# Patient Record
Sex: Female | Born: 1950 | Race: Black or African American | Hispanic: No | Smoking: Current every day smoker
Health system: Southern US, Community
[De-identification: ages and names within clinical notes are randomized; demographics above are authoritative.]

## PROBLEM LIST (undated history)

## (undated) DIAGNOSIS — IMO0002 Reserved for concepts with insufficient information to code with codable children: Secondary | ICD-10-CM

## (undated) DIAGNOSIS — Z803 Family history of malignant neoplasm of breast: Secondary | ICD-10-CM

## (undated) DIAGNOSIS — M1990 Unspecified osteoarthritis, unspecified site: Secondary | ICD-10-CM

## (undated) DIAGNOSIS — R87619 Unspecified abnormal cytological findings in specimens from cervix uteri: Secondary | ICD-10-CM

## (undated) DIAGNOSIS — K648 Other hemorrhoids: Secondary | ICD-10-CM

## (undated) DIAGNOSIS — M2040 Other hammer toe(s) (acquired), unspecified foot: Secondary | ICD-10-CM

## (undated) DIAGNOSIS — I499 Cardiac arrhythmia, unspecified: Secondary | ICD-10-CM

## (undated) DIAGNOSIS — T7840XA Allergy, unspecified, initial encounter: Secondary | ICD-10-CM

## (undated) DIAGNOSIS — I10 Essential (primary) hypertension: Secondary | ICD-10-CM

## (undated) DIAGNOSIS — Z85038 Personal history of other malignant neoplasm of large intestine: Secondary | ICD-10-CM

## (undated) DIAGNOSIS — I4891 Unspecified atrial fibrillation: Secondary | ICD-10-CM

## (undated) HISTORY — DX: Allergy, unspecified, initial encounter: T78.40XA

## (undated) HISTORY — DX: Personal history of other malignant neoplasm of large intestine: Z85.038

## (undated) HISTORY — PX: LAPAROSCOPIC SUPRACERVICAL HYSTERECTOMY: SUR797

## (undated) HISTORY — DX: Unspecified osteoarthritis, unspecified site: M19.90

## (undated) HISTORY — DX: Other hemorrhoids: K64.8

## (undated) HISTORY — DX: Other hammer toe(s) (acquired), unspecified foot: M20.40

## (undated) HISTORY — PX: ABDOMINAL HYSTERECTOMY: SHX81

## (undated) HISTORY — DX: Essential (primary) hypertension: I10

## (undated) HISTORY — DX: Family history of malignant neoplasm of breast: Z80.3

## (undated) HISTORY — DX: Unspecified abnormal cytological findings in specimens from cervix uteri: R87.619

## (undated) HISTORY — DX: Reserved for concepts with insufficient information to code with codable children: IMO0002

## (undated) HISTORY — PX: COLONOSCOPY: SHX174

## (undated) HISTORY — DX: Unspecified atrial fibrillation: I48.91

## (undated) HISTORY — PX: OTHER SURGICAL HISTORY: SHX169

---

## 1986-10-12 HISTORY — PX: OTHER SURGICAL HISTORY: SHX169

## 1998-08-27 ENCOUNTER — Emergency Department (HOSPITAL_COMMUNITY): Admission: EM | Admit: 1998-08-27 | Discharge: 1998-08-27 | Payer: Self-pay | Admitting: Internal Medicine

## 1998-08-27 ENCOUNTER — Encounter: Payer: Self-pay | Admitting: Emergency Medicine

## 1999-01-02 ENCOUNTER — Emergency Department (HOSPITAL_COMMUNITY): Admission: EM | Admit: 1999-01-02 | Discharge: 1999-01-02 | Payer: Self-pay | Admitting: Emergency Medicine

## 1999-01-20 ENCOUNTER — Ambulatory Visit (HOSPITAL_BASED_OUTPATIENT_CLINIC_OR_DEPARTMENT_OTHER): Admission: RE | Admit: 1999-01-20 | Discharge: 1999-01-20 | Payer: Self-pay

## 1999-09-22 ENCOUNTER — Emergency Department (HOSPITAL_COMMUNITY): Admission: EM | Admit: 1999-09-22 | Discharge: 1999-09-22 | Payer: Self-pay | Admitting: Emergency Medicine

## 2000-10-12 ENCOUNTER — Emergency Department (HOSPITAL_COMMUNITY): Admission: EM | Admit: 2000-10-12 | Discharge: 2000-10-12 | Payer: Self-pay | Admitting: Internal Medicine

## 2000-11-03 ENCOUNTER — Emergency Department (HOSPITAL_COMMUNITY): Admission: EM | Admit: 2000-11-03 | Discharge: 2000-11-03 | Payer: Self-pay | Admitting: Emergency Medicine

## 2002-02-25 ENCOUNTER — Emergency Department (HOSPITAL_COMMUNITY): Admission: EM | Admit: 2002-02-25 | Discharge: 2002-02-25 | Payer: Self-pay | Admitting: Emergency Medicine

## 2002-02-25 ENCOUNTER — Encounter: Payer: Self-pay | Admitting: Emergency Medicine

## 2003-05-29 ENCOUNTER — Emergency Department (HOSPITAL_COMMUNITY): Admission: EM | Admit: 2003-05-29 | Discharge: 2003-05-29 | Payer: Self-pay | Admitting: Emergency Medicine

## 2003-09-02 ENCOUNTER — Emergency Department (HOSPITAL_COMMUNITY): Admission: EM | Admit: 2003-09-02 | Discharge: 2003-09-03 | Payer: Self-pay | Admitting: Emergency Medicine

## 2006-05-26 ENCOUNTER — Emergency Department (HOSPITAL_COMMUNITY): Admission: EM | Admit: 2006-05-26 | Discharge: 2006-05-26 | Payer: Self-pay | Admitting: Family Medicine

## 2006-11-11 ENCOUNTER — Emergency Department (HOSPITAL_COMMUNITY): Admission: EM | Admit: 2006-11-11 | Discharge: 2006-11-11 | Payer: Self-pay | Admitting: Emergency Medicine

## 2007-07-12 ENCOUNTER — Ambulatory Visit: Payer: Self-pay | Admitting: Internal Medicine

## 2007-07-12 LAB — CONVERTED CEMR LAB
Alkaline Phosphatase: 77 units/L (ref 39–117)
Basophils Absolute: 0 10*3/uL (ref 0.0–0.1)
Creatinine, Ser: 0.75 mg/dL (ref 0.40–1.20)
Eosinophils Absolute: 0.2 10*3/uL (ref 0.0–0.7)
Eosinophils Relative: 2 % (ref 0–5)
Glucose, Bld: 65 mg/dL — ABNORMAL LOW (ref 70–99)
HCT: 41.5 % (ref 36.0–46.0)
HDL: 58 mg/dL (ref >39)
LDL Cholesterol: 105 mg/dL — ABNORMAL HIGH (ref 0–99)
Lymphs Abs: 3.4 10*3/uL — ABNORMAL HIGH (ref 0.7–3.3)
MCV: 88.3 fL (ref 78.0–100.0)
Monocytes Absolute: 0.4 10*3/uL (ref 0.2–0.7)
Platelets: 230 10*3/uL (ref 150–400)
RDW: 15.3 % — ABNORMAL HIGH (ref 11.5–14.0)
Sodium: 140 meq/L (ref 135–145)
Total Bilirubin: 0.4 mg/dL (ref 0.3–1.2)
Total CHOL/HDL Ratio: 3.1
Total Protein: 6.9 g/dL (ref 6.0–8.3)
Triglycerides: 82 mg/dL (ref <150)
VLDL: 16 mg/dL (ref 0–40)

## 2007-07-14 ENCOUNTER — Ambulatory Visit: Payer: Self-pay | Admitting: *Deleted

## 2007-07-26 ENCOUNTER — Encounter: Payer: Self-pay | Admitting: Family Medicine

## 2007-07-26 ENCOUNTER — Ambulatory Visit: Payer: Self-pay | Admitting: Internal Medicine

## 2007-08-29 ENCOUNTER — Ambulatory Visit: Payer: Self-pay | Admitting: Internal Medicine

## 2007-11-21 ENCOUNTER — Ambulatory Visit: Payer: Self-pay | Admitting: Family Medicine

## 2007-11-23 ENCOUNTER — Ambulatory Visit: Payer: Self-pay | Admitting: Internal Medicine

## 2007-11-29 ENCOUNTER — Ambulatory Visit: Payer: Self-pay | Admitting: Internal Medicine

## 2007-12-13 ENCOUNTER — Ambulatory Visit (HOSPITAL_COMMUNITY): Admission: RE | Admit: 2007-12-13 | Discharge: 2007-12-13 | Payer: Self-pay | Admitting: Family Medicine

## 2008-01-04 ENCOUNTER — Ambulatory Visit: Payer: Self-pay | Admitting: Internal Medicine

## 2008-02-13 ENCOUNTER — Ambulatory Visit: Payer: Self-pay | Admitting: Family Medicine

## 2008-03-19 ENCOUNTER — Ambulatory Visit: Payer: Self-pay | Admitting: Internal Medicine

## 2008-03-20 ENCOUNTER — Encounter (INDEPENDENT_AMBULATORY_CARE_PROVIDER_SITE_OTHER): Payer: Self-pay | Admitting: Family Medicine

## 2009-01-01 ENCOUNTER — Ambulatory Visit: Payer: Self-pay | Admitting: Internal Medicine

## 2009-01-03 ENCOUNTER — Ambulatory Visit: Payer: Self-pay | Admitting: Internal Medicine

## 2009-01-15 ENCOUNTER — Ambulatory Visit (HOSPITAL_COMMUNITY): Admission: RE | Admit: 2009-01-15 | Discharge: 2009-01-15 | Payer: Self-pay | Admitting: Internal Medicine

## 2009-03-25 ENCOUNTER — Ambulatory Visit: Payer: Self-pay | Admitting: Internal Medicine

## 2009-06-21 ENCOUNTER — Telehealth (INDEPENDENT_AMBULATORY_CARE_PROVIDER_SITE_OTHER): Payer: Self-pay | Admitting: *Deleted

## 2009-06-25 ENCOUNTER — Ambulatory Visit: Payer: Self-pay | Admitting: Family Medicine

## 2009-07-04 ENCOUNTER — Ambulatory Visit: Payer: Self-pay | Admitting: Family Medicine

## 2009-07-09 ENCOUNTER — Ambulatory Visit: Payer: Self-pay | Admitting: Internal Medicine

## 2009-08-01 ENCOUNTER — Ambulatory Visit: Payer: Self-pay | Admitting: Internal Medicine

## 2009-08-30 ENCOUNTER — Ambulatory Visit: Payer: Self-pay | Admitting: Family Medicine

## 2010-02-20 ENCOUNTER — Ambulatory Visit: Payer: Self-pay | Admitting: Family Medicine

## 2010-02-20 LAB — CONVERTED CEMR LAB
ALT: 18 units/L (ref 0–35)
AST: 15 units/L (ref 0–37)
BUN: 13 mg/dL (ref 6–23)
Basophils Absolute: 0 10*3/uL (ref 0.0–0.1)
Basophils Relative: 1 % (ref 0–1)
Calcium: 9.6 mg/dL (ref 8.4–10.5)
Creatinine, Ser: 0.74 mg/dL (ref 0.40–1.20)
Eosinophils Absolute: 0.1 10*3/uL (ref 0.0–0.7)
Eosinophils Relative: 2 % (ref 0–5)
HCT: 39.6 % (ref 36.0–46.0)
HCV Ab: NEGATIVE
Hep A IgM: NEGATIVE
MCHC: 31.3 g/dL (ref 30.0–36.0)
MCV: 90 fL (ref 78.0–100.0)
Neutrophils Relative %: 62 % (ref 43–77)
Platelets: 227 10*3/uL (ref 150–400)
RDW: 15.1 % (ref 11.5–15.5)
TSH: 1.261 microintl units/mL (ref 0.350–4.500)
Total Bilirubin: 0.6 mg/dL (ref 0.3–1.2)
WBC: 6.4 10*3/uL (ref 4.0–10.5)

## 2010-02-25 ENCOUNTER — Ambulatory Visit (HOSPITAL_COMMUNITY): Admission: RE | Admit: 2010-02-25 | Discharge: 2010-02-25 | Payer: Self-pay | Admitting: Internal Medicine

## 2010-04-01 ENCOUNTER — Ambulatory Visit: Payer: Self-pay | Admitting: Family Medicine

## 2010-05-09 ENCOUNTER — Ambulatory Visit: Payer: Self-pay | Admitting: Obstetrics and Gynecology

## 2010-06-20 ENCOUNTER — Emergency Department (HOSPITAL_COMMUNITY): Admission: EM | Admit: 2010-06-20 | Discharge: 2010-06-20 | Payer: Self-pay | Admitting: Emergency Medicine

## 2010-11-07 ENCOUNTER — Ambulatory Visit
Admission: RE | Admit: 2010-11-07 | Discharge: 2010-11-07 | Payer: Self-pay | Source: Home / Self Care | Attending: Obstetrics and Gynecology | Admitting: Obstetrics and Gynecology

## 2010-11-07 ENCOUNTER — Other Ambulatory Visit: Payer: Self-pay | Admitting: Obstetrics and Gynecology

## 2010-11-08 NOTE — Progress Notes (Signed)
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  The patient has a normal colposcopic examination in July 2011, and presents today for followup Pap smear.  The patient is also complaining of dyspareunia and vasomotor symptoms and was previously prescribed Premarin that has ran out and desires to be restarted on the Premarin.  Pap smear was performed today with visualization of an atrophic vaginal mucosa.  The patient will be contacted with any abnormal results.  Otherwise, plan is for repeat Pap smear in 6 months. The patient was provided with prescription of 0.3 mg of Premarin.          ______________________________ Catalina Antigua, MD    PC/MEDQ  D:  11/07/2010  T:  11/08/2010  Job:  [id number]

## 2010-12-15 ENCOUNTER — Encounter: Payer: Self-pay | Admitting: Obstetrics & Gynecology

## 2010-12-17 ENCOUNTER — Encounter: Payer: Self-pay | Admitting: Obstetrics and Gynecology

## 2011-01-09 ENCOUNTER — Encounter: Payer: Self-pay | Admitting: Obstetrics & Gynecology

## 2011-01-30 ENCOUNTER — Other Ambulatory Visit (HOSPITAL_COMMUNITY): Payer: Self-pay | Admitting: Family Medicine

## 2011-01-30 DIAGNOSIS — Z1231 Encounter for screening mammogram for malignant neoplasm of breast: Secondary | ICD-10-CM

## 2011-02-23 ENCOUNTER — Encounter: Payer: Self-pay | Admitting: Family Medicine

## 2011-02-27 ENCOUNTER — Ambulatory Visit (HOSPITAL_COMMUNITY): Payer: Self-pay

## 2011-03-11 ENCOUNTER — Other Ambulatory Visit (HOSPITAL_COMMUNITY): Payer: Self-pay | Admitting: Family Medicine

## 2011-03-11 DIAGNOSIS — Z1231 Encounter for screening mammogram for malignant neoplasm of breast: Secondary | ICD-10-CM

## 2011-03-16 ENCOUNTER — Encounter: Payer: Self-pay | Admitting: Family Medicine

## 2011-03-20 ENCOUNTER — Ambulatory Visit (HOSPITAL_COMMUNITY): Payer: Self-pay | Attending: Family Medicine

## 2011-05-26 ENCOUNTER — Ambulatory Visit (HOSPITAL_COMMUNITY)
Admission: RE | Admit: 2011-05-26 | Discharge: 2011-05-26 | Disposition: A | Discharge status: Home / Self Care | Payer: Self-pay | Source: Ambulatory Visit | Attending: Family Medicine | Admitting: Family Medicine

## 2011-05-26 DIAGNOSIS — Z1231 Encounter for screening mammogram for malignant neoplasm of breast: Secondary | ICD-10-CM | POA: Insufficient documentation

## 2011-06-08 ENCOUNTER — Ambulatory Visit (INDEPENDENT_AMBULATORY_CARE_PROVIDER_SITE_OTHER): Payer: Self-pay | Admitting: Family Medicine

## 2011-06-08 ENCOUNTER — Encounter: Payer: Self-pay | Admitting: *Deleted

## 2011-06-08 VITALS — BP 145/84 | HR 72 | Temp 98.6°F | Ht 61.0 in | Wt 146.5 lb

## 2011-06-08 DIAGNOSIS — IMO0002 Reserved for concepts with insufficient information to code with codable children: Secondary | ICD-10-CM

## 2011-06-08 DIAGNOSIS — R87612 Low grade squamous intraepithelial lesion on cytologic smear of cervix (LGSIL): Secondary | ICD-10-CM

## 2011-06-08 DIAGNOSIS — R87622 Low grade squamous intraepithelial lesion on cytologic smear of vagina (LGSIL): Secondary | ICD-10-CM

## 2011-06-08 NOTE — Patient Instructions (Signed)
We will notify you of your results and any additional test or treatments needed.

## 2011-06-08 NOTE — Progress Notes (Signed)
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 She had a TAH in 1987 for menorrhagia/fibroids, and LSO in 1988 or 1987. She had a negative colpo in July 2011, and pap in Jan 2012 showed LSIL again. Patient has rescheduled her colpo several times, and today has presented for this test. She denies any problems with vaginal discharge, vaginal dryness, or other. She states she does not take the premarin prescribed to her regularly and it has been some months since she had taken it. She is on vitamins and no prescription medications at this time.  Filed Vitals:   06/08/11 1435  BP: 145/84  Pulse: 72  Temp: 98.6 F (37 C)    No pregnancy test s/p TAH  Colposcopy of vagina performed with repeat pap testing. Colposcopic examination revealed vaginal cuff with scarring noted. No acetowhite lesions and no lesions noted with lugol's staining. No biopsies taken.  A/P 1. 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 Since it had been > 6 months, pap was repeated today as well. I will contact the patient with results and additional plans as warranted.

## 2011-06-25 ENCOUNTER — Telehealth: Payer: Self-pay | Admitting: *Deleted

## 2011-06-25 NOTE — Telephone Encounter (Signed)
Kelsey Bridges called back again, wanting to know results from 06/08/11 colposcopy appointment. Explained to pt per Doctor notes- doctor did do colposcopy but did not find any abnormal areas to biopsy, so she did another pap- pt. States did not receive any results. Informed pt papsmear was negative. Pt wants to know if she should come back before her next yearly checkup- instructed pt usually after abnormal paps should come back in 6 months. pts states she will schedule a 6 month appt.  Will forward this to Dr. Natale Milch to make sure she wants patient to have 6 month pap versus 1 year pap/annual exam.

## 2011-06-25 NOTE — Telephone Encounter (Addendum)
Pt called today at 7:49 and left a message she was expecting results to be called to her re: her 06/08/11 appointment and she has not received any calls. Called pt. And left a message we are returning her call and will call again , or she can call clinic back.

## 2011-06-26 NOTE — Telephone Encounter (Signed)
Yes, the pap was normal and she will need a repeat pap in 6 months time. Thanks!

## 2012-02-26 ENCOUNTER — Other Ambulatory Visit: Payer: Self-pay | Admitting: Family Medicine

## 2012-02-26 DIAGNOSIS — Z1231 Encounter for screening mammogram for malignant neoplasm of breast: Secondary | ICD-10-CM

## 2012-03-03 ENCOUNTER — Ambulatory Visit: Payer: Self-pay | Admitting: Physician Assistant

## 2012-03-14 ENCOUNTER — Ambulatory Visit: Payer: Self-pay | Admitting: Obstetrics and Gynecology

## 2012-04-04 ENCOUNTER — Ambulatory Visit: Payer: Self-pay | Admitting: Advanced Practice Midwife

## 2012-05-30 ENCOUNTER — Ambulatory Visit: Payer: Self-pay | Admitting: Family Medicine

## 2012-07-04 ENCOUNTER — Ambulatory Visit: Payer: Self-pay | Admitting: Obstetrics & Gynecology

## 2012-09-27 ENCOUNTER — Emergency Department (HOSPITAL_COMMUNITY): Payer: Self-pay

## 2012-09-27 ENCOUNTER — Encounter (HOSPITAL_COMMUNITY): Payer: Self-pay

## 2012-09-27 ENCOUNTER — Emergency Department (INDEPENDENT_AMBULATORY_CARE_PROVIDER_SITE_OTHER)
Admission: EM | Admit: 2012-09-27 | Discharge: 2012-09-27 | Disposition: A | Discharge status: Home / Self Care | Payer: Self-pay | Source: Home / Self Care

## 2012-09-27 DIAGNOSIS — J069 Acute upper respiratory infection, unspecified: Secondary | ICD-10-CM

## 2012-09-27 MED ORDER — DEXTROMETHORPHAN-GUAIFENESIN 10-100 MG/5ML PO LIQD: 5.0000 mL | ORAL | Status: DC | PRN

## 2012-09-27 MED ORDER — DOXYCYCLINE HYCLATE 50 MG PO CAPS: 100.0000 mg | ORAL_CAPSULE | Freq: Two times a day (BID) | ORAL | Status: DC

## 2012-09-27 NOTE — ED Notes (Signed)
C/o cold like symptoms for 1 week.  Cough at night keeps her awake

## 2012-09-27 NOTE — ED Provider Notes (Signed)
Patient Demographics  Kelsey Bridges, is a 61 y.o. female  WUJ:[id number]  NFA:[id number]  DOB - 09/26/1951  Chief Complaint  Patient presents with  . URI        Subjective:   Kelsey Bridges today is hereto establish primary care. She complains of having subjective fever with productive cough for the past one week. She denies any shortness of breath. She claims that his symptoms initially started with diarrhea which resolved, then she started having cough and cold-like symptoms. She has tried numerous over-the-counter remedies with no success. Patient has No headache, No chest pain, No abdominal pain - No Nausea, No new weakness tingling or numbness, No SOB.   Objective:    Filed Vitals:   09/27/12 1603  BP: 139/88  Pulse: 74  Temp: 98.2 F (36.8 C)  TempSrc: Oral  Resp: 19  SpO2: 100%     ALLERGIES:   Allergies  Allergen Reactions  . Penicillins     PAST MEDICAL HISTORY: Past Medical History  Diagnosis Date  . Abnormal Pap smear   . Hypertension     PAST SURGICAL HISTORY: Past Surgical History  Procedure Date  . Laparoscopic supracervical hysterectomy   . Left ovary 1988  . Varicose veins     FAMILY HISTORY: Family History  Problem Relation Age of Onset  . Cancer Brother     liver  . Cancer Sister     breast    MEDICATIONS AT HOME: Prior to Admission medications   Medication Sig Start Date End Date Taking? Authorizing Provider  dextromethorphan-guaiFENesin (ROBITUSSIN-DM) 10-100 MG/5ML liquid Take 5 mLs by mouth every 4 (four) hours as needed for cough. 09/27/12   Hallie Ertl Levora Dredge, MD  doxycycline (VIBRAMYCIN) 50 MG capsule Take 2 capsules (100 mg total) by mouth 2 (two) times daily. 09/27/12   Elia Keenum Levora Dredge, MD  sertraline (ZOLOFT) 50 MG tablet Take 50 mg by mouth daily.      Historical Provider, MD    REVIEW OF SYSTEMS:  Constitutional:   No   Fevers, chills, fatigue.  HEENT:    No headaches, Sore throat,    Cardio-vascular: No chest pain,  Orthopnea, swelling in lower extremities, anasarca, palpitations  GI:  No abdominal pain, nausea, vomiting, diarrhea  Resp: No shortness of breath,  No coughing up of blood.No wheezing.  Skin:  no rash or lesions.  GU:  no dysuria, change in color of urine, no urgency or frequency.  No flank pain.  Musculoskeletal: No joint pain or swelling.  No decreased range of motion.  No back pain.  Psych: No change in mood or affect. No depression or anxiety.  No memory loss.   Exam  General appearance :Awake, alert, not in any distress. Speech Clear. Not toxic Looking HEENT: Atraumatic and Normocephalic, pupils equally reactive to light and accomodation Neck: supple, no JVD. No cervical lymphadenopathy.  Chest:Good air entry bilaterally, no added sounds  CVS: S1 S2 regular, no murmurs.  Abdomen: Bowel sounds present, Non tender and not distended with no gaurding, rigidity or rebound. Extremities: B/L Lower Ext shows no edema, both legs are warm to touch Neurology: Awake alert, and oriented X 3, CN II-XII intact, Non focal Skin:No Rash Wounds:N/A    Data Review   CBC No results found for this basename: WBC:5,HGB:5,HCT:5,PLT:5,MCV:5,MCH:5,MCHC:5,RDW:5,NEUTRABS:5,LYMPHSABS:5,MONOABS:5,EOSABS:5,BASOSABS:5,BANDABS:5,BANDSABD:5 in the last 168 hours  Chemistries   No results found for this basename: NA:5,K:5,CL:5,CO2:5,GLUCOSE:5,BUN:5,CREATININE:5,GFRCGP,:5,CALCIUM:5,MG:5,AST:5,ALT:5,ALKPHOS:5,BILITOT:5 in the last 168 hours ------------------------------------------------------------------------------------------------------------------ No results found for this basename: HGBA1C:2 in the last 72 hours ------------------------------------------------------------------------------------------------------------------  No results found for this basename: CHOL:2,HDL:2,LDLCALC:2,TRIG:2,CHOLHDL:2,LDLDIRECT:2 in the last 72  hours ------------------------------------------------------------------------------------------------------------------ No results found for this basename: TSH,T4TOTAL,FREET3,T3FREE,THYROIDAB in the last 72 hours ------------------------------------------------------------------------------------------------------------------ No results found for this basename: VITAMINB12:2,FOLATE:2,FERRITIN:2,TIBC:2,IRON:2,RETICCTPCT:2 in the last 72 hours  Coagulation profile  No results found for this basename: INR:5,PROTIME:5 in the last 168 hours    Assessment & Plan   Likely URI - Offered to get a chest x-ray-however the patient refused, she claims to have subjective fever. We empirically try to treat with doxycycline and patient to continue to use over-the-counter antitussives. We'll see her back in 2 weeks, if her symptoms are still persistent will see if she will agree to a chest x-ray that time.  Health maintenance - Will need reassessment of her health maintenance issues once she is over this acute illness    Follow-up Information    Schedule an appointment as soon as possible for a visit in 2 weeks to follow up.          Maretta Bees, MD 09/27/12 [phone]

## 2013-01-06 ENCOUNTER — Ambulatory Visit (INDEPENDENT_AMBULATORY_CARE_PROVIDER_SITE_OTHER): Payer: Self-pay | Admitting: Medical

## 2013-01-06 ENCOUNTER — Encounter: Payer: Self-pay | Admitting: Medical

## 2013-01-06 VITALS — BP 140/92 | HR 66 | Temp 98.3°F | Ht 59.0 in | Wt 138.9 lb

## 2013-01-06 DIAGNOSIS — Z87898 Personal history of other specified conditions: Secondary | ICD-10-CM

## 2013-01-06 DIAGNOSIS — Z8742 Personal history of other diseases of the female genital tract: Secondary | ICD-10-CM

## 2013-01-06 NOTE — Patient Instructions (Signed)

## 2013-01-06 NOTE — Progress Notes (Signed)
Subjective:    Kelsey Bridges is a 62 y.o. female who presents for an annual exam. The patient has no complaints today. The patient is not sexually active. GYN screening history: last pap: was normal. The patient wears seatbelts: yes. The patient participates in regular exercise: yes. Has the patient ever been transfused or tattooed?: no. The patient reports that there is not domestic violence in her life. Patient had hysterectomy in 1987 for DUB. Her last pap was in 05/2011 and was normal. She had one abnormal pap prior to that which showed LSIL of the vagina. She had a normal colposcopy that did not require any biopsies. She is due for her mammogram now.   Menstrual History: OB History   Grav Para Term Preterm Abortions TAB SAB Ect Mult Living   2 1 1  1  1   1        No LMP recorded. Patient has had a hysterectomy.    The following portions of the patient's history were reviewed and updated as appropriate: allergies, current medications, past family history, past medical history, past social history, past surgical history and problem list.  Review of Systems Pertinent items are noted in HPI.    Objective:     BP 140/92  Pulse 66  Temp(Src) 98.3 F (36.8 C)  Ht 4\' 11"  (1.499 m)  Wt 138 lb 14.4 oz (63.005 kg)  BMI 28.04 kg/m2 GENERAL: Well-developed, well-nourished female in no acute distress.  HEENT: Normocephalic, atraumatic. LUNGS: Clear to auscultation bilaterally.  HEART: Regular rate and rhythm. BREASTS: Symmetric in size. No masses, skin changes, nipple drainage, or lymphadenopathy. ABDOMEN: Soft, nontender, nondistended. No organomegaly. PELVIC: Normal external female genitalia. Vagina is pink and rugated.  Normal discharge. Well-healed vaginal cuff. Pap smear obtained. No suprapubic tenderness on bimanual exam. EXTREMITIES: No cyanosis, clubbing, or edema, moderate area of venous stasis on the left lower extremity.   .    Assessment:    Healthy female exam.     Plan:    1. Patient will be contacted with any abnormal pap results 2. With normal results patient should not require additional pap smears 3. Patient given contact information for [facility] to establish primary care 4. Patient to call to schedule mammogram today  2. Patient

## 2013-01-06 NOTE — Progress Notes (Signed)
History of colpo, last pap 05/2011- here for annual and pap if needed. Thinks she needs iron because she is cold a lot of the time, especially if she drinks cold drinks. Patient states used to go to [facility] and needs to establish care again. Called Clinic seeing [facility] patients and no answer and unable to leave message . Gave patient phone numbers to call Family medicine at [facility]. Or   [facility].

## 2013-01-23 ENCOUNTER — Telehealth: Payer: Self-pay

## 2013-01-23 NOTE — Telephone Encounter (Signed)
Called pt and informed her that she had an abnormal pap smear and the provider would like for her to come in for a coloscopy.  I informed pt that her appt is scheduled for May 5 @ 1pm.  Pt stated "ok, thank you cause I'm on my way to work".  I advised pt if that appt does not work to please give [place] a call back.  Pt stated "ok" and had no further questions.

## 2013-01-23 NOTE — Telephone Encounter (Signed)
Message copied by Faythe Casa on Mon Jan 23, 2013  9:06 AM ------      Message from: Malena Catholic      Created: Fri Jan 20, 2013  8:19 AM       Appt scheduled  Feb 13, 2013 at 1:00 pm with Dr Macon Large            ----- Message -----         From: Drucilla Schmidt Day, RN         Sent: 01/11/2013   4:53 PM           To: [facility] Admin Pool            Please schedule colpo appt and return to clinical pool for pt to be notified.  Thanks       ----- Message -----         From: Freddi Starr, PA-C         Sent: 01/11/2013   4:45 PM           To: [facility] Clinical Pool            Patient had ASCUS on vaginal pap. Will need vaginal colposcopy scheduled. Please get an appointment and inform the patient. If the patient asks, this will be the last pap/colpo that she should need. If colpo is normal she will no longer get pap smears unless a gross abnormality is seen during annual exam. Thanks!             ------

## 2013-02-13 ENCOUNTER — Encounter: Payer: Self-pay | Admitting: Obstetrics & Gynecology

## 2013-03-10 ENCOUNTER — Encounter: Payer: Self-pay | Admitting: Obstetrics & Gynecology

## 2013-04-12 ENCOUNTER — Ambulatory Visit: Payer: Self-pay | Admitting: Obstetrics & Gynecology

## 2013-07-04 ENCOUNTER — Telehealth: Payer: Self-pay | Admitting: *Deleted

## 2013-07-04 NOTE — Telephone Encounter (Addendum)
Pt left message regarding her procedure appt. She wants to know if it is painful. I returned pt's call and left message stating that there is some minor discomfort during the procedure, however the procedure is very short. She will be able to drive and return to normal activities following the appt. She will be able to take Tylenol or ibuprofen if needed after the procedure although many pts do not require any medication. We will discuss the procedure in more detail on the day of her appt. If she had additional questions she may call back.

## 2013-08-10 ENCOUNTER — Encounter: Payer: Self-pay | Admitting: Obstetrics & Gynecology

## 2013-10-02 ENCOUNTER — Encounter: Payer: Self-pay | Admitting: Obstetrics and Gynecology

## 2014-02-07 ENCOUNTER — Emergency Department (HOSPITAL_COMMUNITY)
Admission: EM | Admit: 2014-02-07 | Discharge: 2014-02-07 | Disposition: A | Discharge status: Home / Self Care | Payer: Self-pay | Attending: Emergency Medicine | Admitting: Emergency Medicine

## 2014-02-07 ENCOUNTER — Encounter (HOSPITAL_COMMUNITY): Payer: Self-pay | Admitting: Emergency Medicine

## 2014-02-07 DIAGNOSIS — I10 Essential (primary) hypertension: Secondary | ICD-10-CM | POA: Insufficient documentation

## 2014-02-07 DIAGNOSIS — R42 Dizziness and giddiness: Secondary | ICD-10-CM | POA: Insufficient documentation

## 2014-02-07 DIAGNOSIS — Z8739 Personal history of other diseases of the musculoskeletal system and connective tissue: Secondary | ICD-10-CM | POA: Insufficient documentation

## 2014-02-07 DIAGNOSIS — F17200 Nicotine dependence, unspecified, uncomplicated: Secondary | ICD-10-CM | POA: Insufficient documentation

## 2014-02-07 DIAGNOSIS — Z880 Allergy status to penicillin: Secondary | ICD-10-CM | POA: Insufficient documentation

## 2014-02-07 DIAGNOSIS — Z79899 Other long term (current) drug therapy: Secondary | ICD-10-CM | POA: Insufficient documentation

## 2014-02-07 DIAGNOSIS — Z791 Long term (current) use of non-steroidal anti-inflammatories (NSAID): Secondary | ICD-10-CM | POA: Insufficient documentation

## 2014-02-07 LAB — I-STAT TROPONIN, ED: Troponin i, poc: 0 ng/mL (ref 0.00–0.08)

## 2014-02-07 LAB — CBC WITH DIFFERENTIAL/PLATELET
Basophils Absolute: 0 10*3/uL (ref 0.0–0.1)
Basophils Relative: 0 % (ref 0–1)
EOS ABS: 0.1 10*3/uL (ref 0.0–0.7)
Eosinophils Relative: 1 % (ref 0–5)
HCT: 39.8 % (ref 36.0–46.0)
HEMOGLOBIN: 12.9 g/dL (ref 12.0–15.0)
LYMPHS ABS: 1.3 10*3/uL (ref 0.7–4.0)
Lymphocytes Relative: 24 % (ref 12–46)
MCH: 28.9 pg (ref 26.0–34.0)
MCHC: 32.4 g/dL (ref 30.0–36.0)
MCV: 89.2 fL (ref 78.0–100.0)
MONOS PCT: 5 % (ref 3–12)
Monocytes Absolute: 0.3 10*3/uL (ref 0.1–1.0)
NEUTROS PCT: 70 % (ref 43–77)
Neutro Abs: 3.8 10*3/uL (ref 1.7–7.7)
Platelets: 226 10*3/uL (ref 150–400)
RBC: 4.46 MIL/uL (ref 3.87–5.11)
RDW: 13.9 % (ref 11.5–15.5)
WBC: 5.4 10*3/uL (ref 4.0–10.5)

## 2014-02-07 LAB — URINALYSIS, ROUTINE W REFLEX MICROSCOPIC
Bilirubin Urine: NEGATIVE
GLUCOSE, UA: NEGATIVE mg/dL
Ketones, ur: NEGATIVE mg/dL
LEUKOCYTES UA: NEGATIVE
Nitrite: NEGATIVE
PH: 5.5 (ref 5.0–8.0)
Protein, ur: NEGATIVE mg/dL
SPECIFIC GRAVITY, URINE: 1.01 (ref 1.005–1.030)
Urobilinogen, UA: 0.2 mg/dL (ref 0.0–1.0)

## 2014-02-07 LAB — I-STAT CHEM 8, ED
BUN: 12 mg/dL (ref 6–23)
CHLORIDE: 103 meq/L (ref 96–112)
CREATININE: 1 mg/dL (ref 0.50–1.10)
Calcium, Ion: 1.3 mmol/L (ref 1.13–1.30)
GLUCOSE: 93 mg/dL (ref 70–99)
HCT: 41 % (ref 36.0–46.0)
Hemoglobin: 13.9 g/dL (ref 12.0–15.0)
POTASSIUM: 3.7 meq/L (ref 3.7–5.3)
Sodium: 143 mEq/L (ref 137–147)
TCO2: 24 mmol/L (ref 0–100)

## 2014-02-07 LAB — URINE MICROSCOPIC-ADD ON

## 2014-02-07 MED ORDER — LISINOPRIL 20 MG PO TABS: 20.0000 mg | ORAL_TABLET | Freq: Every day | ORAL | Status: DC

## 2014-02-07 MED ORDER — CLONIDINE HCL 0.1 MG PO TABS
0.1000 mg | ORAL_TABLET | Freq: Once | ORAL | Status: AC
  Administered 2014-02-07: 0.1 mg via ORAL
  Filled 2014-02-07: qty 1

## 2014-02-07 NOTE — ED Provider Notes (Signed)
CSN: [id number]     Arrival date & time 02/07/14  1021 History   First MD Initiated Contact with Patient 02/07/14 1033     Chief Complaint  Patient presents with  . Dizziness     (Consider location/radiation/quality/duration/timing/severity/associated sxs/prior Treatment) HPI Comments: Patient presents with dizziness. She has a history of hypertension and was previously on antihypertensive medication. She states she took it for about 3 months but she did not like the way it made her felt and felt that her blood pressure was better social she stopped taking it. She stopped taking it about a year ago. She has noted since that time that her blood pressure when she checked it has been in the 573-220 range systolic. Today she noted that she's had some dizziness. She had an episode last week that resolved. Today she woke up at 6 AM and felt like her balance was off and she had the sensation that the room was spinning. This is only on standing, not with head movement. She denies any headache. She denies any vision changes or speech deficits. She denies any numbness or weakness to her extremities. She denies any chest pain or shortness of breath. She denies any fevers or recent illnesses.  Patient is a 63 y.o. female presenting with dizziness.  Dizziness Associated symptoms: no blood in stool, no chest pain, no diarrhea, no headaches, no nausea, no shortness of breath and no vomiting     Past Medical History  Diagnosis Date  . Abnormal Pap smear   . Hypertension   . Hammer toe    Past Surgical History  Procedure Laterality Date  . Laparoscopic supracervical hysterectomy    . Left ovary  1988  . Varicose veins    . Abdominal hysterectomy     Family History  Problem Relation Age of Onset  . Cancer Brother     liver  . Cancer Sister     breast   History  Substance Use Topics  . Smoking status: Current Every Day Smoker -- 0.25 packs/day    Types: Cigarettes  . Smokeless tobacco: Current  User    Types: Snuff  . Alcohol Use: Yes     Comment: "at the holidays maybe"   OB History   Grav Para Term Preterm Abortions TAB SAB Ect Mult Living   2 1 1  1  1   1      Review of Systems  Constitutional: Negative for fever, chills, diaphoresis and fatigue.  HENT: Negative for congestion, rhinorrhea and sneezing.   Eyes: Negative.   Respiratory: Negative for cough, chest tightness and shortness of breath.   Cardiovascular: Negative for chest pain and leg swelling.  Gastrointestinal: Negative for nausea, vomiting, abdominal pain, diarrhea and blood in stool.  Genitourinary: Negative for frequency, hematuria, flank pain and difficulty urinating.  Musculoskeletal: Negative for arthralgias and back pain.  Skin: Negative for rash.  Neurological: Positive for dizziness. Negative for speech difficulty, weakness, numbness and headaches.      Allergies  Penicillins  Home Medications   Prior to Admission medications   Medication Sig Start Date End Date Taking? Authorizing Provider  ibuprofen (ADVIL,MOTRIN) 200 MG tablet Take 400 mg by mouth every 6 (six) hours as needed for fever, headache or mild pain.    Yes Historical Provider, MD  MELATONIN PO Take 1 tablet by mouth at bedtime as needed ([employer] of Melatonin).   Yes Historical Provider, MD   BP 132/89  Pulse 56  Temp(Src) 97.7  F (36.5 C)  Resp 9  Ht 5\' 1"  (1.549 m)  Wt 140 lb (63.504 kg)  BMI 26.47 kg/m2  SpO2 100% Physical Exam  Constitutional: She is oriented to person, place, and time. She appears well-developed and well-nourished.  HENT:  Head: Normocephalic and atraumatic.  Eyes: Pupils are equal, round, and reactive to light.  Neck: Normal range of motion. Neck supple.  Cardiovascular: Normal rate, regular rhythm and normal heart sounds.   Pulmonary/Chest: Effort normal and breath sounds normal. No respiratory distress. She has no wheezes. She has no rales. She exhibits no tenderness.  Abdominal:  Soft. Bowel sounds are normal. There is no tenderness. There is no rebound and no guarding.  Musculoskeletal: Normal range of motion. She exhibits no edema.  Lymphadenopathy:    She has no cervical adenopathy.  Neurological: She is alert and oriented to person, place, and time.  Motor 5 out of 5 all extremities. Sensation grossly intact to light touch all extremities. No pronator drift. Finger to nose intact. No cranial nerve deficits. She has normal sensation to her face. No facial drooping. Normal shoulder shrug. Extraocular eye movements are intact.  Skin: Skin is warm and dry. No rash noted.  Psychiatric: She has a normal mood and affect.    ED Course  Procedures (including critical care time) Labs Review Results for orders placed during the hospital encounter of 02/07/14  CBC WITH DIFFERENTIAL      Result Value Ref Range   WBC 5.4  4.0 - 10.5 K/uL   RBC 4.46  3.87 - 5.11 MIL/uL   Hemoglobin 12.9  12.0 - 15.0 g/dL   HCT 39.8  36.0 - 46.0 %   MCV 89.2  78.0 - 100.0 fL   MCH 28.9  26.0 - 34.0 pg   MCHC 32.4  30.0 - 36.0 g/dL   RDW 13.9  11.5 - 15.5 %   Platelets 226  150 - 400 K/uL   Neutrophils Relative % 70  43 - 77 %   Neutro Abs 3.8  1.7 - 7.7 K/uL   Lymphocytes Relative 24  12 - 46 %   Lymphs Abs 1.3  0.7 - 4.0 K/uL   Monocytes Relative 5  3 - 12 %   Monocytes Absolute 0.3  0.1 - 1.0 K/uL   Eosinophils Relative 1  0 - 5 %   Eosinophils Absolute 0.1  0.0 - 0.7 K/uL   Basophils Relative 0  0 - 1 %   Basophils Absolute 0.0  0.0 - 0.1 K/uL  URINALYSIS, ROUTINE W REFLEX MICROSCOPIC      Result Value Ref Range   Color, Urine YELLOW  YELLOW   APPearance CLOUDY (*) CLEAR   Specific Gravity, Urine 1.010  1.005 - 1.030   pH 5.5  5.0 - 8.0   Glucose, UA NEGATIVE  NEGATIVE mg/dL   Hgb urine dipstick SMALL (*) NEGATIVE   Bilirubin Urine NEGATIVE  NEGATIVE   Ketones, ur NEGATIVE  NEGATIVE mg/dL   Protein, ur NEGATIVE  NEGATIVE mg/dL   Urobilinogen, UA 0.2  0.0 - 1.0 mg/dL    Nitrite NEGATIVE  NEGATIVE   Leukocytes, UA NEGATIVE  NEGATIVE  URINE MICROSCOPIC-ADD ON      Result Value Ref Range   Squamous Epithelial / LPF RARE  RARE   WBC, UA 0-2  <3 WBC/hpf   RBC / HPF 0-2  <3 RBC/hpf   Bacteria, UA FEW (*) RARE  I-STAT CHEM 8, ED      Result  Value Ref Range   Sodium 143  137 - 147 mEq/L   Potassium 3.7  3.7 - 5.3 mEq/L   Chloride 103  96 - 112 mEq/L   BUN 12  6 - 23 mg/dL   Creatinine, Ser 1.00  0.50 - 1.10 mg/dL   Glucose, Bld 93  70 - 99 mg/dL   Calcium, Ion 1.30  1.13 - 1.30 mmol/L   TCO2 24  0 - 100 mmol/L   Hemoglobin 13.9  12.0 - 15.0 g/dL   HCT 41.0  36.0 - 46.0 %  I-STAT TROPOININ, ED      Result Value Ref Range   Troponin i, poc 0.00  0.00 - 0.08 ng/mL   Comment 3            No results found.    Imaging Review No results found.   EKG Interpretation None      Date: 02/07/2014  Rate: 72  Rhythm: normal sinus rhythm  QRS Axis: normal  Intervals: normal  ST/T Wave abnormalities: nonspecific ST/T changes  Conduction Disutrbances:none  Narrative Interpretation:   Old EKG Reviewed: none available   MDM   Final diagnoses:  HTN (hypertension)  Dizziness    Patient presents with some dizziness and elevated blood pressure. She was given a dose of clonidine by mouth in the ED and her blood pressure improved. She was able a rate in the ED without any symptoms. GI no ataxia. She has no other neurologic deficits. I feel that she can be discharged home with followup with her PMD. I feel her symptoms are likely related to her elevated blood pressure. I don't see any other evidence of a stroke or TIA.  Will start lisinopril.  Advised pt to f/u with her PMD at [facility] within the next week.  Return here if symptoms worsen.    Malvin Johns, MD 02/07/14 1450

## 2014-02-07 NOTE — ED Notes (Signed)
Pt from home via [facility] with c/o dizziness starting this morning at 6 am.  It has been intermittent since then.  Denies HA/N/V, stroke scale was neg, chest pain, syncope, or fall.  Pt reports she had dizziness this past Sunday morning but it resolved itself that morning.  Pt in NAD, A&O.

## 2014-02-07 NOTE — Discharge Instructions (Signed)
Arterial Hypertension °Arterial hypertension (high blood pressure) is a condition of elevated pressure in your blood vessels. Hypertension over a long period of time is a risk factor for strokes, heart attacks, and heart failure. It is also the leading cause of kidney (renal) failure.  °CAUSES  °· In Adults -- Over 90% of all hypertension has no known cause. This is called essential or primary hypertension. In the other 10% of people with hypertension, the increase in blood pressure is caused by another disorder. This is called secondary hypertension. Important causes of secondary hypertension are: °· Heavy alcohol use. °· Obstructive sleep apnea. °· Hyperaldosterosim (Conn's syndrome). °· Steroid use. °· Chronic kidney failure. °· Hyperparathyroidism. °· Medications. °· Renal artery stenosis. °· Pheochromocytoma. °· Cushing's disease. °· Coarctation of the aorta. °· Scleroderma renal crisis. °· Licorice (in excessive amounts). °· Drugs (cocaine, methamphetamine). °Your caregiver can explain any items above that apply to you. °· In Children -- Secondary hypertension is more common and should always be considered. °· Pregnancy -- Few women of childbearing age have high blood pressure. However, up to 10% of them develop hypertension of pregnancy. Generally, this will not harm the woman. It may be a sign of 3 complications of pregnancy: preeclampsia, HELLP syndrome, and eclampsia. Follow up and control with medication is necessary. °SYMPTOMS  °· This condition normally does not produce any noticeable symptoms. It is usually found during a routine exam. °· Malignant hypertension is a late problem of high blood pressure. It may have the following symptoms: °· Headaches. °· Blurred vision. °· End-organ damage (this means your kidneys, heart, lungs, and other organs are being damaged). °· Stressful situations can increase the blood pressure. If a person with normal blood pressure has their blood pressure go up while being  seen by their caregiver, this is often termed "white coat hypertension." Its importance is not known. It may be related with eventually developing hypertension or complications of hypertension. °· Hypertension is often confused with mental tension, stress, and anxiety. °DIAGNOSIS  °The diagnosis is made by 3 separate blood pressure measurements. They are taken at least 1 week apart from each other. If there is organ damage from hypertension, the diagnosis may be made without repeat measurements. °Hypertension is usually identified by having blood pressure readings: °· Above 140/90 mmHg measured in both arms, at 3 separate times, over a couple weeks. °· Over 130/80 mmHg should be considered a risk factor and may require treatment in patients with diabetes. °Blood pressure readings over 120/80 mmHg are called "pre-hypertension" even in non-diabetic patients. °To get a true blood pressure measurement, use the following guidelines. Be aware of the factors that can alter blood pressure readings. °· Take measurements at least 1 hour after caffeine. °· Take measurements 30 minutes after smoking and without any stress. This is another reason to quit smoking  it raises your blood pressure. °· Use a proper cuff size. Ask your caregiver if you are not sure about your cuff size. °· Most home blood pressure cuffs are automatic. They will measure systolic and diastolic pressures. The systolic pressure is the pressure reading at the start of sounds. Diastolic pressure is the pressure at which the sounds disappear. If you are elderly, measure pressures in multiple postures. Try sitting, lying or standing. °· Sit at rest for a minimum of 5 minutes before taking measurements. °· You should not be on any medications like decongestants. These are found in many cold medications. °· Record your blood pressure readings and review   them with your caregiver. °If you have hypertension: °· Your caregiver may do tests to be sure you do not have  secondary hypertension (see "causes" above). °· Your caregiver may also look for signs of metabolic syndrome. This is also called Syndrome X or Insulin Resistance Syndrome. You may have this syndrome if you have type 2 diabetes, abdominal obesity, and abnormal blood lipids in addition to hypertension. °· Your caregiver will take your medical and family history and perform a physical exam. °· Diagnostic tests may include blood tests (for glucose, cholesterol, potassium, and kidney function), a urinalysis, or an EKG. Other tests may also be necessary depending on your condition. °PREVENTION  °There are important lifestyle issues that you can adopt to reduce your chance of developing hypertension: °· Maintain a normal weight. °· Limit the amount of salt (sodium) in your diet. °· Exercise often. °· Limit alcohol intake. °· Get enough potassium in your diet. Discuss specific advice with your caregiver. °· Follow a DASH diet (dietary approaches to stop hypertension). This diet is rich in fruits, vegetables, and low-fat dairy products, and avoids certain fats. °PROGNOSIS  °Essential hypertension cannot be cured. Lifestyle changes and medical treatment can lower blood pressure and reduce complications. The prognosis of secondary hypertension depends on the underlying cause. Many people whose hypertension is controlled with medicine or lifestyle changes can live a normal, healthy life.  °RISKS AND COMPLICATIONS  °While high blood pressure alone is not an illness, it often requires treatment due to its short- and long-term effects on many organs. Hypertension increases your risk for: °· CVAs or strokes (cerebrovascular accident). °· Heart failure due to chronically high blood pressure (hypertensive cardiomyopathy). °· Heart attack (myocardial infarction). °· Damage to the retina (hypertensive retinopathy). °· Kidney failure (hypertensive nephropathy). °Your caregiver can explain list items above that apply to you. Treatment  of hypertension can significantly reduce the risk of complications. °TREATMENT  °· For overweight patients, weight loss and regular exercise are recommended. Physical fitness lowers blood pressure. °· Mild hypertension is usually treated with diet and exercise. A diet rich in fruits and vegetables, fat-free dairy products, and foods low in fat and salt (sodium) can help lower blood pressure. Decreasing salt intake decreases blood pressure in a 1/3 of people. °· Stop smoking if you are a smoker. °The steps above are highly effective in reducing blood pressure. While these actions are easy to suggest, they are difficult to achieve. Most patients with moderate or severe hypertension end up requiring medications to bring their blood pressure down to a normal level. There are several classes of medications for treatment. Blood pressure pills (antihypertensives) will lower blood pressure by their different actions. Lowering the blood pressure by 10 mmHg may decrease the risk of complications by as much as 25%. °The goal of treatment is effective blood pressure control. This will reduce your risk for complications. Your caregiver will help you determine the best treatment for you according to your lifestyle. What is excellent treatment for one person, may not be for you. °HOME CARE INSTRUCTIONS  °· Do not smoke. °· Follow the lifestyle changes outlined in the "Prevention" section. °· If you are on medications, follow the directions carefully. Blood pressure medications must be taken as prescribed. Skipping doses reduces their benefit. It also puts you at risk for problems. °· Follow up with your caregiver, as directed. °· If you are asked to monitor your blood pressure at home, follow the guidelines in the "Diagnosis" section above. °SEEK MEDICAL CARE   IF:   You think you are having medication side effects.  You have recurrent headaches or lightheadedness.  You have swelling in your ankles.  You have trouble with  your vision. SEEK IMMEDIATE MEDICAL CARE IF:   You have sudden onset of chest pain or pressure, difficulty breathing, or other symptoms of a heart attack.  You have a severe headache.  You have symptoms of a stroke (such as sudden weakness, difficulty speaking, difficulty walking). MAKE SURE YOU:   Understand these instructions.  Will watch your condition.  Will get help right away if you are not doing well or get worse. Document Released: 09/28/2005 Document Revised: 12/21/2011 Document Reviewed: 04/28/2007 [facility] Patient Information 2014 [place].  Hypertension As your heart beats, it forces blood through your arteries. This force is your blood pressure. If the pressure is too high, it is called hypertension (HTN) or high blood pressure. HTN is dangerous because you may have it and not know it. High blood pressure may mean that your heart has to work harder to pump blood. Your arteries may be narrow or stiff. The extra work puts you at risk for heart disease, stroke, and other problems.  Blood pressure consists of two numbers, a higher number over a lower, 110/72, for example. It is stated as "110 over 72." The ideal is below 120 for the top number (systolic) and under 80 for the bottom (diastolic). Write down your blood pressure today. You should pay close attention to your blood pressure if you have certain conditions such as:  Heart failure.  Prior heart attack.  Diabetes  Chronic kidney disease.  Prior stroke.  Multiple risk factors for heart disease. To see if you have HTN, your blood pressure should be measured while you are seated with your arm held at the level of the heart. It should be measured at least twice. A one-time elevated blood pressure reading (especially in the Emergency Department) does not mean that you need treatment. There may be conditions in which the blood pressure is different between your right and left arms. It is important to see your  caregiver soon for a recheck. Most people have essential hypertension which means that there is not a specific cause. This type of high blood pressure may be lowered by changing lifestyle factors such as:  Stress.  Smoking.  Lack of exercise.  Excessive weight.  Drug/tobacco/alcohol use.  Eating less salt. Most people do not have symptoms from high blood pressure until it has caused damage to the body. Effective treatment can often prevent, delay or reduce that damage. TREATMENT  When a cause has been identified, treatment for high blood pressure is directed at the cause. There are a large number of medications to treat HTN. These fall into several categories, and your caregiver will help you select the medicines that are best for you. Medications may have side effects. You should review side effects with your caregiver. If your blood pressure stays high after you have made lifestyle changes or started on medicines,   Your medication(s) may need to be changed.  Other problems may need to be addressed.  Be certain you understand your prescriptions, and know how and when to take your medicine.  Be sure to follow up with your caregiver within the time frame advised (usually within two weeks) to have your blood pressure rechecked and to review your medications.  If you are taking more than one medicine to lower your blood pressure, make sure you know how  and at what times they should be taken. Taking two medicines at the same time can result in blood pressure that is too low. SEEK IMMEDIATE MEDICAL CARE IF:  You develop a severe headache, blurred or changing vision, or confusion.  You have unusual weakness or numbness, or a faint feeling.  You have severe chest or abdominal pain, vomiting, or breathing problems. MAKE SURE YOU:   Understand these instructions.  Will watch your condition.  Will get help right away if you are not doing well or get worse. Document Released:  09/28/2005 Document Revised: 12/21/2011 Document Reviewed: 05/18/2008 [facility] Patient Information 2014 [place].  Vertigo Vertigo means you feel like you or your surroundings are moving when they are not. Vertigo can be dangerous if it occurs when you are at work, driving, or performing difficult activities.  CAUSES  Vertigo occurs when there is a conflict of signals sent to your brain from the visual and sensory systems in your body. There are many different causes of vertigo, including:  Infections, especially in the inner ear.  A bad reaction to a drug or misuse of alcohol and medicines.  Withdrawal from drugs or alcohol.  Rapidly changing positions, such as lying down or rolling over in bed.  A migraine headache.  Decreased blood flow to the brain.  Increased pressure in the brain from a head injury, infection, tumor, or bleeding. SYMPTOMS  You may feel as though the world is spinning around or you are falling to the ground. Because your balance is upset, vertigo can cause nausea and vomiting. You may have involuntary eye movements (nystagmus). DIAGNOSIS  Vertigo is usually diagnosed by physical exam. If the cause of your vertigo is unknown, your caregiver may perform imaging tests, such as an MRI scan (magnetic resonance imaging). TREATMENT  Most cases of vertigo resolve on their own, without treatment. Depending on the cause, your caregiver may prescribe certain medicines. If your vertigo is related to body position issues, your caregiver may recommend movements or procedures to correct the problem. In rare cases, if your vertigo is caused by certain inner ear problems, you may need surgery. HOME CARE INSTRUCTIONS   Follow your caregiver's instructions.  Avoid driving.  Avoid operating heavy machinery.  Avoid performing any tasks that would be dangerous to you or others during a vertigo episode.  Tell your caregiver if you notice that certain medicines seem to be  causing your vertigo. Some of the medicines used to treat vertigo episodes can actually make them worse in some people. SEEK IMMEDIATE MEDICAL CARE IF:   Your medicines do not relieve your vertigo or are making it worse.  You develop problems with talking, walking, weakness, or using your arms, hands, or legs.  You develop severe headaches.  Your nausea or vomiting continues or gets worse.  You develop visual changes.  A family member notices behavioral changes.  Your condition gets worse. MAKE SURE YOU:  Understand these instructions.  Will watch your condition.  Will get help right away if you are not doing well or get worse. Document Released: 07/08/2005 Document Revised: 12/21/2011 Document Reviewed: 04/16/2011 [facility] Patient Information 2014 [place].

## 2014-02-07 NOTE — Discharge Planning (Signed)
[employer] Felicia E, Community Liaison  Spoke to patient about follow up care and establishing care with a provider. Patient states she was a [facility] patient but was displaced when the practice closed. I made the patient an appointment for the orange card and to re-establish care with Family Medicine at [facility] for Monday May 18,2015 at 12:00 pm. Patient is aware of this appointment, resource guide and my contact information was provided.

## 2014-02-07 NOTE — ED Notes (Signed)
Pt ambulated in hall well.  Reported a large decrease in her level of dizziness.

## 2014-05-01 ENCOUNTER — Telehealth: Payer: Self-pay | Admitting: General Practice

## 2014-05-01 NOTE — Telephone Encounter (Signed)
Patient called and left message stating she had an appt last year for some abnormal cells and missed the appt and would like it rescheduled and was told to speak with a nurse so we would know how to schedule her. Per chart review patient needs to have vaginal colposcopy. Called patient back informing her that she will need to come in and have the procedure done but they may not do it at that time since it's been over a year since her pap smear and that our front office staff will call her with an appt. Patient verbalized understanding and asked what she could take for stomach cramping. Recommended patient take tylenol or ibuprofen. Patient verbalized understanding and had no other questions

## 2014-05-03 ENCOUNTER — Telehealth: Payer: Self-pay | Admitting: *Deleted

## 2014-05-03 ENCOUNTER — Encounter: Payer: Self-pay | Admitting: Nurse Practitioner

## 2014-05-03 ENCOUNTER — Encounter: Payer: Self-pay | Admitting: *Deleted

## 2014-05-03 NOTE — Telephone Encounter (Signed)
Patient missed appointment, called patient, no answer, left message that patient missed appointment and to please call and reschedule.  Letter sent.

## 2014-06-13 ENCOUNTER — Ambulatory Visit: Payer: Self-pay | Admitting: Obstetrics & Gynecology

## 2014-07-11 ENCOUNTER — Telehealth: Payer: Self-pay | Admitting: *Deleted

## 2014-07-11 ENCOUNTER — Ambulatory Visit: Payer: Self-pay | Admitting: Obstetrics & Gynecology

## 2014-07-11 ENCOUNTER — Encounter: Payer: Self-pay | Admitting: *Deleted

## 2014-07-11 NOTE — Telephone Encounter (Signed)
Attempted to contact patient, no answer, left message concerning missed appointment and to please call and reschedule.  Will send letter.  Letter sent.

## 2014-08-13 ENCOUNTER — Encounter (HOSPITAL_COMMUNITY): Payer: Self-pay | Admitting: Emergency Medicine

## 2014-10-25 ENCOUNTER — Encounter (HOSPITAL_COMMUNITY): Payer: Self-pay

## 2014-10-25 ENCOUNTER — Emergency Department (HOSPITAL_COMMUNITY): Payer: Self-pay

## 2014-10-25 ENCOUNTER — Emergency Department (HOSPITAL_COMMUNITY)
Admission: EM | Admit: 2014-10-25 | Discharge: 2014-10-25 | Disposition: A | Discharge status: Home / Self Care | Payer: Self-pay | Attending: Emergency Medicine | Admitting: Emergency Medicine

## 2014-10-25 DIAGNOSIS — R05 Cough: Secondary | ICD-10-CM | POA: Insufficient documentation

## 2014-10-25 DIAGNOSIS — Z720 Tobacco use: Secondary | ICD-10-CM | POA: Insufficient documentation

## 2014-10-25 DIAGNOSIS — Z880 Allergy status to penicillin: Secondary | ICD-10-CM | POA: Insufficient documentation

## 2014-10-25 DIAGNOSIS — J029 Acute pharyngitis, unspecified: Secondary | ICD-10-CM | POA: Insufficient documentation

## 2014-10-25 DIAGNOSIS — R059 Cough, unspecified: Secondary | ICD-10-CM

## 2014-10-25 DIAGNOSIS — R079 Chest pain, unspecified: Secondary | ICD-10-CM | POA: Insufficient documentation

## 2014-10-25 DIAGNOSIS — I10 Essential (primary) hypertension: Secondary | ICD-10-CM | POA: Insufficient documentation

## 2014-10-25 LAB — CBC
HEMATOCRIT: 37 % (ref 36.0–46.0)
Hemoglobin: 12.3 g/dL (ref 12.0–15.0)
MCH: 28.6 pg (ref 26.0–34.0)
MCHC: 33.2 g/dL (ref 30.0–36.0)
MCV: 86 fL (ref 78.0–100.0)
Platelets: 296 10*3/uL (ref 150–400)
RBC: 4.3 MIL/uL (ref 3.87–5.11)
RDW: 14.1 % (ref 11.5–15.5)
WBC: 10.1 10*3/uL (ref 4.0–10.5)

## 2014-10-25 LAB — BASIC METABOLIC PANEL
Anion gap: 7 (ref 5–15)
BUN: 11 mg/dL (ref 6–23)
CALCIUM: 9.6 mg/dL (ref 8.4–10.5)
CO2: 26 mmol/L (ref 19–32)
Chloride: 107 mEq/L (ref 96–112)
Creatinine, Ser: 1.04 mg/dL (ref 0.50–1.10)
GFR calc non Af Amer: 56 mL/min — ABNORMAL LOW (ref >90)
GFR, EST AFRICAN AMERICAN: 64 mL/min — AB (ref >90)
Glucose, Bld: 106 mg/dL — ABNORMAL HIGH (ref 70–99)
Potassium: 4.1 mmol/L (ref 3.5–5.1)
SODIUM: 140 mmol/L (ref 135–145)

## 2014-10-25 LAB — I-STAT TROPONIN, ED: TROPONIN I, POC: 0 ng/mL (ref 0.00–0.08)

## 2014-10-25 NOTE — ED Notes (Signed)
Pt reporting cough x 1 week and chest pain x 2 days.  Sts she has been sick and has been taking OTC medication with no relief.  Sts she has attempted to cough sputum up but with no success.  Last took mucinex this morning.  Reports yellow drainage from nose.

## 2014-10-25 NOTE — ED Provider Notes (Signed)
CSN: [id number]     Arrival date & time 10/25/14  1347 History   First MD Initiated Contact with Patient 10/25/14 1713     Chief Complaint  Patient presents with  . Chest Pain  . Cough     (Consider location/radiation/quality/duration/timing/severity/associated sxs/prior Treatment) Patient is a 64 y.o. female presenting with cough.  Cough Cough characteristics:  Productive Sputum characteristics:  Yellow Severity:  Moderate Onset quality:  Gradual Duration:  5 days Timing:  Constant Progression:  Unchanged Chronicity:  Recurrent Smoker: yes   Context: upper respiratory infection   Relieved by:  Nothing Worsened by:  Nothing tried Associated symptoms: chest pain (with cough), sinus congestion and sore throat   Associated symptoms: no fever and no shortness of breath     Past Medical History  Diagnosis Date  . Abnormal Pap smear   . Hypertension   . Hammer toe    Past Surgical History  Procedure Laterality Date  . Laparoscopic supracervical hysterectomy    . Left ovary  1988  . Varicose veins    . Abdominal hysterectomy     Family History  Problem Relation Age of Onset  . Cancer Brother     liver  . Cancer Sister     breast   History  Substance Use Topics  . Smoking status: Current Every Day Smoker -- 0.25 packs/day    Types: Cigarettes  . Smokeless tobacco: Current User    Types: Snuff  . Alcohol Use: Yes     Comment: "at the holidays maybe"   OB History    Gravida Para Term Preterm AB TAB SAB Ectopic Multiple Living   2 1 1  1  1   1      Review of Systems  Constitutional: Negative for fever.  HENT: Positive for sore throat.   Respiratory: Positive for cough. Negative for shortness of breath.   Cardiovascular: Positive for chest pain (with cough).  All other systems reviewed and are negative.     Allergies  Penicillins  Home Medications   Prior to Admission medications   Medication Sig Start Date End Date Taking? Authorizing Provider   ibuprofen (ADVIL,MOTRIN) 200 MG tablet Take 400 mg by mouth every 6 (six) hours as needed for fever, headache or mild pain.     Historical Provider, MD  lisinopril (PRINIVIL,ZESTRIL) 20 MG tablet Take 1 tablet (20 mg total) by mouth daily. 02/07/14   Malvin Johns, MD  MELATONIN PO Take 1 tablet by mouth at bedtime as needed ([employer] of Melatonin).    Historical Provider, MD   BP 159/104 mmHg  Pulse 94  Temp(Src) 99.1 F (37.3 C) (Oral)  Resp 20  Ht 5\' 1"  (1.549 m)  Wt 143 lb (64.864 kg)  BMI 27.03 kg/m2  SpO2 97% Physical Exam  Constitutional: She is oriented to person, place, and time. She appears well-developed and well-nourished.  HENT:  Head: Normocephalic and atraumatic.  Right Ear: External ear normal.  Left Ear: External ear normal.  Mouth/Throat: Posterior oropharyngeal erythema present. No oropharyngeal exudate, posterior oropharyngeal edema or tonsillar abscesses.  Eyes: Conjunctivae and EOM are normal. Pupils are equal, round, and reactive to light.  Neck: Normal range of motion. Neck supple.  Cardiovascular: Normal rate, regular rhythm, normal heart sounds and intact distal pulses.   Pulmonary/Chest: Effort normal and breath sounds normal.  Abdominal: Soft. Bowel sounds are normal. There is no tenderness.  Musculoskeletal: Normal range of motion.  Neurological: She is alert and oriented to person,  place, and time.  Skin: Skin is warm and dry.  Vitals reviewed.   ED Course  Procedures (including critical care time) Labs Review Labs Reviewed  BASIC METABOLIC PANEL - Abnormal; Notable for the following:    Glucose, Bld 106 (*)    GFR calc non Af Amer 56 (*)    GFR calc Af Amer 64 (*)    All other components within normal limits  CBC  I-STAT TROPOININ, ED    Imaging Review Dg Chest 2 View  10/25/2014   CLINICAL DATA:  Cough and congestion for 2 weeks  EXAM: CHEST  2 VIEW  COMPARISON:  11/11/2006  FINDINGS: Cardiomediastinal silhouette is stable. No  acute infiltrate or pleural effusion. No pulmonary edema. Again noted tortuous descending aorta. Mild degenerative changes mid thoracic spine. Mild hyperinflation.  IMPRESSION: No active disease. Mild degenerative changes thoracic spine. Mild hyperinflation.   Electronically Signed   By: Lahoma Crocker M.D.   On: 10/25/2014 14:59     EKG Interpretation   Date/Time:  Thursday October 25 2014 13:56:13 EST Ventricular Rate:  90 PR Interval:  134 QRS Duration: 68 QT Interval:  356 QTC Calculation: 435 R Axis:   62 Text Interpretation:  Normal sinus rhythm Right atrial enlargement  Borderline ECG No significant change since last tracing Confirmed by  Debby Freiberg [phone]) on 10/25/2014 5:17:47 PM      MDM   Final diagnoses:  Cough    63 y.o. female with pertinent PMH of HTN presents with cough, sore throat, chest pain when coughing. Patient denies fever. She is well-appearing. Vital signs and physical exam as above on arrival. Symptoms consistent with viral pharyngitis. Standard return precautions given. Instructed patient to take Afrin and Sudafed. Discharged home in stable condition..    I have reviewed all laboratory and imaging studies if ordered as above  1. Cough   2. Chest pain         Debby Freiberg, MD 10/25/14 [phone]

## 2014-10-25 NOTE — Discharge Instructions (Signed)
You can take pseudoephedrine (sudafed brand name) and Afrin (for 2-3 days) for your symptoms Chest Pain (Nonspecific) It is often hard to give a specific diagnosis for the cause of chest pain. There is always a chance that your pain could be related to something serious, such as a heart attack or a blood clot in the lungs. You need to follow up with your health care provider for further evaluation. CAUSES   Heartburn.  Pneumonia or bronchitis.  Anxiety or stress.  Inflammation around your heart (pericarditis) or lung (pleuritis or pleurisy).  A blood clot in the lung.  A collapsed lung (pneumothorax). It can develop suddenly on its own (spontaneous pneumothorax) or from trauma to the chest.  Shingles infection (herpes zoster virus). The chest wall is composed of bones, muscles, and cartilage. Any of these can be the source of the pain.  The bones can be bruised by injury.  The muscles or cartilage can be strained by coughing or overwork.  The cartilage can be affected by inflammation and become sore (costochondritis). DIAGNOSIS  Lab tests or other studies may be needed to find the cause of your pain. Your health care provider may have you take a test called an ambulatory electrocardiogram (ECG). An ECG records your heartbeat patterns over a 24-hour period. You may also have other tests, such as:  Transthoracic echocardiogram (TTE). During echocardiography, sound waves are used to evaluate how blood flows through your heart.  Transesophageal echocardiogram (TEE).  Cardiac monitoring. This allows your health care provider to monitor your heart rate and rhythm in real time.  Holter monitor. This is a portable device that records your heartbeat and can help diagnose heart arrhythmias. It allows your health care provider to track your heart activity for several days, if needed.  Stress tests by exercise or by giving medicine that makes the heart beat faster. TREATMENT   Treatment  depends on what may be causing your chest pain. Treatment may include:  Acid blockers for heartburn.  Anti-inflammatory medicine.  Pain medicine for inflammatory conditions.  Antibiotics if an infection is present.  You may be advised to change lifestyle habits. This includes stopping smoking and avoiding alcohol, caffeine, and chocolate.  You may be advised to keep your head raised (elevated) when sleeping. This reduces the chance of acid going backward from your stomach into your esophagus. Most of the time, nonspecific chest pain will improve within 2-3 days with rest and mild pain medicine.  HOME CARE INSTRUCTIONS   If antibiotics were prescribed, take them as directed. Finish them even if you start to feel better.  For the next few days, avoid physical activities that bring on chest pain. Continue physical activities as directed.  Do not use any tobacco products, including cigarettes, chewing tobacco, or electronic cigarettes.  Avoid drinking alcohol.  Only take medicine as directed by your health care provider.  Follow your health care provider's suggestions for further testing if your chest pain does not go away.  Keep any follow-up appointments you made. If you do not go to an appointment, you could develop lasting (chronic) problems with pain. If there is any problem keeping an appointment, call to reschedule. SEEK MEDICAL CARE IF:   Your chest pain does not go away, even after treatment.  You have a rash with blisters on your chest.  You have a fever. SEEK IMMEDIATE MEDICAL CARE IF:   You have increased chest pain or pain that spreads to your arm, neck, jaw, back, or abdomen.  You have shortness of breath.  You have an increasing cough, or you cough up blood.  You have severe back or abdominal pain.  You feel nauseous or vomit.  You have severe weakness.  You faint.  You have chills. This is an emergency. Do not wait to see if the pain will go away. Get  medical help at once. Call your local emergency services (911 in [place] Do not drive yourself to the hospital. MAKE SURE YOU:   Understand these instructions.  Will watch your condition.  Will get help right away if you are not doing well or get worse. Document Released: 07/08/2005 Document Revised: 10/03/2013 Document Reviewed: 05/03/2008 [facility] Patient Information 2015 [place], [place]. This information is not intended to replace advice given to you by your health care provider. Make sure you discuss any questions you have with your health care provider. Upper Respiratory Infection, Adult An upper respiratory infection (URI) is also sometimes known as the common cold. The upper respiratory tract includes the nose, sinuses, throat, trachea, and bronchi. Bronchi are the airways leading to the lungs. Most people improve within 1 week, but symptoms can last up to 2 weeks. A residual cough may last even longer.  CAUSES Many different viruses can infect the tissues lining the upper respiratory tract. The tissues become irritated and inflamed and often become very moist. Mucus production is also common. A cold is contagious. You can easily spread the virus to others by oral contact. This includes kissing, sharing a glass, coughing, or sneezing. Touching your mouth or nose and then touching a surface, which is then touched by another person, can also spread the virus. SYMPTOMS  Symptoms typically develop 1 to 3 days after you come in contact with a cold virus. Symptoms vary from person to person. They may include:  Runny nose.  Sneezing.  Nasal congestion.  Sinus irritation.  Sore throat.  Loss of voice (laryngitis).  Cough.  Fatigue.  Muscle aches.  Loss of appetite.  Headache.  Low-grade fever. DIAGNOSIS  You might diagnose your own cold based on familiar symptoms, since most people get a cold 2 to 3 times a year. Your caregiver can confirm this based on your exam. Most  importantly, your caregiver can check that your symptoms are not due to another disease such as strep throat, sinusitis, pneumonia, asthma, or epiglottitis. Blood tests, throat tests, and X-rays are not necessary to diagnose a common cold, but they may sometimes be helpful in excluding other more serious diseases. Your caregiver will decide if any further tests are required. RISKS AND COMPLICATIONS  You may be at risk for a more severe case of the common cold if you smoke cigarettes, have chronic heart disease (such as heart failure) or lung disease (such as asthma), or if you have a weakened immune system. The very young and very old are also at risk for more serious infections. Bacterial sinusitis, middle ear infections, and bacterial pneumonia can complicate the common cold. The common cold can worsen asthma and chronic obstructive pulmonary disease (COPD). Sometimes, these complications can require emergency medical care and may be life-threatening. PREVENTION  The best way to protect against getting a cold is to practice good hygiene. Avoid oral or hand contact with people with cold symptoms. Wash your hands often if contact occurs. There is no clear evidence that vitamin C, vitamin E, echinacea, or exercise reduces the chance of developing a cold. However, it is always recommended to get plenty of rest and practice  good nutrition. TREATMENT  Treatment is directed at relieving symptoms. There is no cure. Antibiotics are not effective, because the infection is caused by a virus, not by bacteria. Treatment may include:  Increased fluid intake. Sports drinks offer valuable electrolytes, sugars, and fluids.  Breathing heated mist or steam (vaporizer or shower).  Eating chicken soup or other clear broths, and maintaining good nutrition.  Getting plenty of rest.  Using gargles or lozenges for comfort.  Controlling fevers with ibuprofen or acetaminophen as directed by your caregiver.  Increasing  usage of your inhaler if you have asthma. Zinc gel and zinc lozenges, taken in the first 24 hours of the common cold, can shorten the duration and lessen the severity of symptoms. Pain medicines may help with fever, muscle aches, and throat pain. A variety of non-prescription medicines are available to treat congestion and runny nose. Your caregiver can make recommendations and may suggest nasal or lung inhalers for other symptoms.  HOME CARE INSTRUCTIONS   Only take over-the-counter or prescription medicines for pain, discomfort, or fever as directed by your caregiver.  Use a warm mist humidifier or inhale steam from a shower to increase air moisture. This may keep secretions moist and make it easier to breathe.  Drink enough water and fluids to keep your urine clear or pale yellow.  Rest as needed.  Return to work when your temperature has returned to normal or as your caregiver advises. You may need to stay home longer to avoid infecting others. You can also use a face mask and careful hand washing to prevent spread of the virus. SEEK MEDICAL CARE IF:   After the first few days, you feel you are getting worse rather than better.  You need your caregiver's advice about medicines to control symptoms.  You develop chills, worsening shortness of breath, or brown or red sputum. These may be signs of pneumonia.  You develop yellow or brown nasal discharge or pain in the face, especially when you bend forward. These may be signs of sinusitis.  You develop a fever, swollen neck glands, pain with swallowing, or white areas in the back of your throat. These may be signs of strep throat. SEEK IMMEDIATE MEDICAL CARE IF:   You have a fever.  You develop severe or persistent headache, ear pain, sinus pain, or chest pain.  You develop wheezing, a prolonged cough, cough up blood, or have a change in your usual mucus (if you have chronic lung disease).  You develop sore muscles or a stiff  neck. Document Released: 03/24/2001 Document Revised: 12/21/2011 Document Reviewed: 01/03/2014 [facility] Patient Information 2015 [place], [place]. This information is not intended to replace advice given to you by your health care provider. Make sure you discuss any questions you have with your health care provider.

## 2015-01-07 ENCOUNTER — Ambulatory Visit: Payer: Self-pay | Admitting: Obstetrics & Gynecology

## 2015-08-12 ENCOUNTER — Other Ambulatory Visit (HOSPITAL_COMMUNITY)
Admission: RE | Admit: 2015-08-12 | Discharge: 2015-08-12 | Disposition: A | Discharge status: Home / Self Care | Payer: Medicare Other | Source: Ambulatory Visit | Attending: Medical | Admitting: Medical

## 2015-08-12 ENCOUNTER — Ambulatory Visit (INDEPENDENT_AMBULATORY_CARE_PROVIDER_SITE_OTHER): Payer: Medicare Other | Admitting: Medical

## 2015-08-12 ENCOUNTER — Encounter: Payer: Self-pay | Admitting: Medical

## 2015-08-12 VITALS — BP 160/80 | HR 84 | Wt 143.6 lb

## 2015-08-12 DIAGNOSIS — Z124 Encounter for screening for malignant neoplasm of cervix: Secondary | ICD-10-CM | POA: Diagnosis present

## 2015-08-12 DIAGNOSIS — Z1151 Encounter for screening for human papillomavirus (HPV): Secondary | ICD-10-CM | POA: Insufficient documentation

## 2015-08-12 DIAGNOSIS — Z01419 Encounter for gynecological examination (general) (routine) without abnormal findings: Secondary | ICD-10-CM | POA: Insufficient documentation

## 2015-08-12 DIAGNOSIS — Z0000 Encounter for general adult medical examination without abnormal findings: Secondary | ICD-10-CM | POA: Diagnosis not present

## 2015-08-12 DIAGNOSIS — N952 Postmenopausal atrophic vaginitis: Secondary | ICD-10-CM

## 2015-08-12 MED ORDER — ESTROGENS CONJUGATED 0.3 MG PO TABS: 0.3000 mg | ORAL_TABLET | Freq: Every day | ORAL | Status: DC

## 2015-08-12 NOTE — Addendum Note (Signed)
Addended by: Shelly Coss on: 08/12/2015 05:06 PM   Modules accepted: Orders

## 2015-08-12 NOTE — Progress Notes (Signed)
Patient ID: Kelsey Bridges, female   DOB: 05-16-1951, 64 y.o.   MRN: [id number] Subjective:    Kelsey Bridges is a 64 y.o. female who presents for an annual exam. The patient has no complaints today. The patient is not currently sexually active. GYN screening history: last pap: approximate date 01/2013 and was abnormal: ASCUS +HPV. The patient wears seatbelts: yes. The patient participates in regular exercise: not asked. Has the patient ever been transfused or tattooed?: not asked. The patient reports that there is not domestic violence in her life.   Menstrual History: OB History    Gravida Para Term Preterm AB TAB SAB Ectopic Multiple Living   2 1 1  1  1   1        No LMP recorded. Patient has had a hysterectomy.    The following portions of the patient's history were reviewed and updated as appropriate: allergies, current medications, past family history, past medical history, past social history, past surgical history and problem list.  Review of Systems Pertinent items are noted in HPI.    Objective:   Physical Exam  Constitutional: She is oriented to person, place, and time. She appears well-developed and well-nourished. No distress.  HENT:  Head: Normocephalic.  Cardiovascular: Normal rate.   Pulmonary/Chest: Effort normal.  Abdominal: Soft. She exhibits no distension and no mass. There is tenderness (mild LLQ abdominal tenderness to palpation). There is no rebound and no guarding.  Genitourinary: There is no rash, tenderness or lesion on the right labia. There is no rash, tenderness or lesion on the left labia. No erythema or bleeding in the vagina. No vaginal discharge found.  Musculoskeletal: She exhibits no edema.  Neurological: She is alert and oriented to person, place, and time.  Skin: Skin is warm and dry. Rash (large area of eczematous appearing rash with hypopigmentation noted on the left shin) noted. No erythema.  Psychiatric: She has a normal mood and affect.       Assessment:    Healthy female exam.   S/P Hysterectomy H/O abnormal vaginal pap smear   Plan:     Await pap smear results.   Patient will be contacted with abnormal results Patient advised that due to age and surgical removal of cervix she will no longer need pap smears Patient advised to continue regular mammograms with the [facility] as in the past Rx refill of premarin sent to patient's pharmacy Patient to return to [facility] in 1 year for annual exam or sooner PRN  Luvenia Redden, PA-C 08/12/2015 2:41 PM

## 2015-08-12 NOTE — Patient Instructions (Addendum)
Pap Test WHY AM I HAVING THIS TEST? A pap test is sometimes called a pap smear. It is a screening test that is used to check for signs of cancer of the vagina, cervix, and uterus. The test can also identify the presence of infection or precancerous changes. Your health care provider will likely recommend you have this test done on a regular basis. This test may be done:  Every 3 years, starting at age 65.  Every 5 years, in combination with testing for the presence of human papillomavirus (HPV).  More or less often depending on other medical conditions.  WHAT KIND OF SAMPLE IS TAKEN? Using a small cotton swab, plastic spatula, or brush, your health care provider will collect a sample of cells from the surface of your cervix. Your cervix is the opening to your uterus, also called a womb. Secretions from the cervix and vagina may also be collected. HOW DO I PREPARE FOR THE TEST?  Be aware of where you are in your menstrual cycle. You may be asked to reschedule the test if you are menstruating on the day of the test.  You may need to reschedule if you have a known vaginal infection on the day of the test.  You may be asked to avoid douching or taking a bath the day before or the day of the test.  Some medicines can cause abnormal test results, such as digitalis and tetracycline. Talk with your health care provider before your test if you take one of these medicines. WHAT DO THE RESULTS MEAN? Abnormal test results may indicate a number of health conditions. These may include:  Cancer. Although pap test results cannot be used to diagnose cancer of the cervix, vagina, or uterus, they may suggest the possibility of cancer. Further tests would be required to determine if cancer is present.  Sexually transmitted disease.  Fungal infection.  Parasite infection.  Herpes infection.  A condition causing or contributing to infertility. It is your responsibility to obtain your test results. Ask  the lab or department performing the test when and how you will get your results. Contact your health care provider to discuss any questions you have about your results.   This information is not intended to replace advice given to you by your health care provider. Make sure you discuss any questions you have with your health care provider.   Document Released: 12/19/2002 Document Revised: 10/19/2014 Document Reviewed: 02/19/2014 Elsevier Interactive Patient Education 2016 [place]. Menopause Menopause is the normal time of life when menstrual periods stop completely. Menopause is complete when you have missed 12 consecutive menstrual periods. It usually occurs between the ages of 27 years and 50 years. Very rarely does a woman develop menopause before the age of 74 years. At menopause, your ovaries stop producing the female hormones estrogen and progesterone. This can cause undesirable symptoms and also affect your health. Sometimes the symptoms may occur 4-5 years before the menopause begins. There is no relationship between menopause and:  Oral contraceptives.  Number of children you had.  Race.  The age your menstrual periods started (menarche). Heavy smokers and very thin women may develop menopause earlier in life. CAUSES  The ovaries stop producing the female hormones estrogen and progesterone.  Other causes include:  Surgery to remove both ovaries.  The ovaries stop functioning for no known reason.  Tumors of the pituitary gland in the brain.  Medical disease that affects the ovaries and hormone production.  Radiation treatment to  the abdomen or pelvis.  Chemotherapy that affects the ovaries. SYMPTOMS   Hot flashes.  Night sweats.  Decrease in sex drive.  Vaginal dryness and thinning of the vagina causing painful intercourse.  Dryness of the skin and developing wrinkles.  Headaches.  Tiredness.  Irritability.  Memory problems.  Weight  gain.  Bladder infections.  Hair growth of the face and chest.  Infertility. More serious symptoms include:  Loss of bone (osteoporosis) causing breaks (fractures).  Depression.  Hardening and narrowing of the arteries (atherosclerosis) causing heart attacks and strokes. DIAGNOSIS   When the menstrual periods have stopped for 12 straight months.  Physical exam.  Hormone studies of the blood. TREATMENT  There are many treatment choices and nearly as many questions about them. The decisions to treat or not to treat menopausal changes is an individual choice made with your health care provider. Your health care provider can discuss the treatments with you. Together, you can decide which treatment will work best for you. Your treatment choices may include:   Hormone therapy (estrogen and progesterone).  Non-hormonal medicines.  Treating the individual symptoms with medicine (for example antidepressants for depression).  Herbal medicines that may help specific symptoms.  Counseling by a psychiatrist or psychologist.  Group therapy.  Lifestyle changes including:  Eating healthy.  Regular exercise.  Limiting caffeine and alcohol.  Stress management and meditation.  No treatment. HOME CARE INSTRUCTIONS   Take the medicine your health care provider gives you as directed.  Get plenty of sleep and rest.  Exercise regularly.  Eat a diet that contains calcium (good for the bones) and soy products (acts like estrogen hormone).  Avoid alcoholic beverages.  Do not smoke.  If you have hot flashes, dress in layers.  Take supplements, calcium, and vitamin D to strengthen bones.  You can use over-the-counter lubricants or moisturizers for vaginal dryness.  Group therapy is sometimes very helpful.  Acupuncture may be helpful in some cases. SEEK MEDICAL CARE IF:   You are not sure you are in menopause.  You are having menopausal symptoms and need advice and  treatment.  You are still having menstrual periods after age 42 years.  You have pain with intercourse.  Menopause is complete (no menstrual period for 12 months) and you develop vaginal bleeding.  You need a referral to a specialist (gynecologist, psychiatrist, or psychologist) for treatment. SEEK IMMEDIATE MEDICAL CARE IF:   You have severe depression.  You have excessive vaginal bleeding.  You fell and think you have a broken bone.  You have pain when you urinate.  You develop leg or chest pain.  You have a fast pounding heart beat (palpitations).  You have severe headaches.  You develop vision problems.  You feel a lump in your breast.  You have abdominal pain or severe indigestion.   This information is not intended to replace advice given to you by your health care provider. Make sure you discuss any questions you have with your health care provider.   Document Released: 12/19/2003 Document Revised: 05/31/2013 Document Reviewed: 04/27/2013 Elsevier Interactive Patient Education [employer].

## 2015-08-13 ENCOUNTER — Other Ambulatory Visit: Payer: Self-pay

## 2015-08-13 DIAGNOSIS — Z1231 Encounter for screening mammogram for malignant neoplasm of breast: Secondary | ICD-10-CM

## 2015-08-15 LAB — CYTOLOGY - PAP

## 2015-08-29 ENCOUNTER — Ambulatory Visit: Payer: Medicaid Other

## 2015-09-09 ENCOUNTER — Encounter (HOSPITAL_COMMUNITY): Payer: Self-pay | Admitting: *Deleted

## 2015-09-09 ENCOUNTER — Emergency Department (HOSPITAL_COMMUNITY)
Admission: EM | Admit: 2015-09-09 | Discharge: 2015-09-09 | Disposition: A | Discharge status: Home / Self Care | Payer: Medicare Other | Attending: Emergency Medicine | Admitting: Emergency Medicine

## 2015-09-09 DIAGNOSIS — Z79899 Other long term (current) drug therapy: Secondary | ICD-10-CM | POA: Diagnosis not present

## 2015-09-09 DIAGNOSIS — Z880 Allergy status to penicillin: Secondary | ICD-10-CM | POA: Diagnosis not present

## 2015-09-09 DIAGNOSIS — Z8739 Personal history of other diseases of the musculoskeletal system and connective tissue: Secondary | ICD-10-CM | POA: Diagnosis not present

## 2015-09-09 DIAGNOSIS — F17210 Nicotine dependence, cigarettes, uncomplicated: Secondary | ICD-10-CM | POA: Insufficient documentation

## 2015-09-09 DIAGNOSIS — J209 Acute bronchitis, unspecified: Secondary | ICD-10-CM | POA: Diagnosis not present

## 2015-09-09 DIAGNOSIS — Z79818 Long term (current) use of other agents affecting estrogen receptors and estrogen levels: Secondary | ICD-10-CM | POA: Diagnosis not present

## 2015-09-09 DIAGNOSIS — I10 Essential (primary) hypertension: Secondary | ICD-10-CM | POA: Insufficient documentation

## 2015-09-09 DIAGNOSIS — J40 Bronchitis, not specified as acute or chronic: Secondary | ICD-10-CM

## 2015-09-09 DIAGNOSIS — R05 Cough: Secondary | ICD-10-CM | POA: Diagnosis present

## 2015-09-09 MED ORDER — PREDNISONE 10 MG (21) PO TBPK: 10.0000 mg | ORAL_TABLET | Freq: Every day | ORAL | Status: DC

## 2015-09-09 MED ORDER — AZITHROMYCIN 250 MG PO TABS: 250.0000 mg | ORAL_TABLET | Freq: Every day | ORAL | Status: DC

## 2015-09-09 MED ORDER — ALBUTEROL SULFATE HFA 108 (90 BASE) MCG/ACT IN AERS
2.0000 | INHALATION_SPRAY | Freq: Once | RESPIRATORY_TRACT | Status: AC
  Administered 2015-09-09: 2 via RESPIRATORY_TRACT
  Filled 2015-09-09: qty 6.7

## 2015-09-09 MED ORDER — BENZONATATE 100 MG PO CAPS: 100.0000 mg | ORAL_CAPSULE | Freq: Two times a day (BID) | ORAL | Status: DC | PRN

## 2015-09-09 NOTE — ED Provider Notes (Signed)
CSN: [id number]     Arrival date & time 09/09/15  1318 History  By signing my name below, I, Evelene Croon, attest that this documentation has been prepared under the direction and in the presence of non-physician practitioner, Clayton Bibles, PA-C. Electronically Signed: Evelene Croon, Scribe. 09/09/2015. 2:46 PM.  Chief Complaint  Patient presents with  . URI  . Cough   The history is provided by the patient. No language interpreter was used.    HPI Comments:  Kelsey Bridges is a 64 y.o. female who presents to the Emergency Department complaining of "deep" cough productive of yellow sputum for 2 weeks. She has taken coricidin, OTC cough syrup and alkaseltzer pulse without relief. She reports associated wheezing, rhinorrhea and mild sore throat. Pt denies fever, abdominal pain, recent vomiting, diarrhea, appetite change and leg swelling.  She also denies h/o asthma/COPD, recent long periods of immobilization. She notes sick contacts as she travels on the city bus.    PCP Oletta Lamas   Past Medical History  Diagnosis Date  . Abnormal Pap smear   . Hypertension   . Hammer toe    Past Surgical History  Procedure Laterality Date  . Laparoscopic supracervical hysterectomy    . Left ovary  1988  . Varicose veins    . Abdominal hysterectomy     Family History  Problem Relation Age of Onset  . Cancer Brother     liver  . Cancer Sister     breast   Social History  Substance Use Topics  . Smoking status: Current Every Day Smoker -- 0.25 packs/day    Types: Cigarettes  . Smokeless tobacco: Current User    Types: Snuff  . Alcohol Use: Yes     Comment: "at the holidays maybe"   OB History    Gravida Para Term Preterm AB TAB SAB Ectopic Multiple Living   2 1 1  1  1   1      Review of Systems  Constitutional: Negative for fever, chills and appetite change.  HENT: Positive for rhinorrhea and sore throat. Negative for trouble swallowing.   Respiratory: Positive for cough and  wheezing. Negative for shortness of breath.   Cardiovascular: Negative for chest pain and leg swelling.  Gastrointestinal: Negative for nausea, abdominal pain and diarrhea.  Musculoskeletal: Negative for neck stiffness.  Skin: Negative for rash.  Allergic/Immunologic: Negative for immunocompromised state.  Psychiatric/Behavioral: Negative for self-injury.   Allergies  Penicillins  Home Medications   Prior to Admission medications   Medication Sig Start Date End Date Taking? Authorizing Provider  estrogens, conjugated, (PREMARIN) 0.3 MG tablet Take 1 tablet (0.3 mg total) by mouth daily. 08/12/15   Luvenia Redden, PA-C  ibuprofen (ADVIL,MOTRIN) 200 MG tablet Take 400 mg by mouth every 6 (six) hours as needed for fever, headache or mild pain.     Historical Provider, MD  lisinopril (PRINIVIL,ZESTRIL) 20 MG tablet Take 1 tablet (20 mg total) by mouth daily. 02/07/14   Malvin Johns, MD  MELATONIN PO Take 1 tablet by mouth at bedtime as needed ([employer] Brand of Melatonin).    Historical Provider, MD  sertraline (ZOLOFT) 25 MG tablet Take 25 mg by mouth daily.    Historical Provider, MD   BP 163/110 mmHg  Pulse 81  Temp(Src) 99 F (37.2 C) (Oral)  Resp 20  SpO2 98% Physical Exam  Constitutional: She appears well-developed and well-nourished. No distress.  HENT:  Head: Normocephalic and atraumatic.  Nose: Mucosal edema present.  Mouth/Throat: Oropharynx is clear and moist. No oropharyngeal exudate.  Eyes: Conjunctivae are normal.  Neck: Neck supple.  Cardiovascular: Normal rate and regular rhythm.   Pulmonary/Chest: Effort normal. No respiratory distress. She has wheezes (mild). She has no rales.  Neurological: She is alert. She exhibits normal muscle tone.  Gait is normal   Skin: No rash noted. She is not diaphoretic.  Psychiatric: She has a normal mood and affect. Her behavior is normal.  Nursing note and vitals reviewed.   ED Course  Procedures   DIAGNOSTIC  STUDIES:  Oxygen Saturation is 98% on RA, normal by my interpretation.    COORDINATION OF CARE:  2:44 PM Will discharge with antibiotic, and steroids. Discussed treatment plan with pt at bedside and pt agreed to plan.  2:53 PM Discussed pt with Dr Eulis Foster who will also see the patient.    MDM   Final diagnoses:  None    Afebrile, nontoxic patient with constellation of symptoms suggestive of viral syndrome with prolonged course.  Mild wheezing, c/o pinching pain at left lateral rib space. Pulmonary exam normal, O2 is normal on room air.  No SOB.  Suspect bronchitis, possible early pneumonia.  Will treat. Doubt pulmonary embolism.  Discharged home with z-pak, prednisone, albuterol, tessalon, PCP follow up.  Discussed result, findings, treatment, and follow up  with patient.  Pt given return precautions.  Pt verbalizes understanding and agrees with plan.      I personally performed the services described in this documentation, which was scribed in my presence. The recorded information has been reviewed and is accurate.    Clayton Bibles, PA-C 09/09/15 2158  Daleen Bo, MD 09/10/15 [phone]

## 2015-09-09 NOTE — ED Notes (Signed)
Pt reports cough and cold symptoms x 2 weeks, no relief with otc meds. Reports cough is productive with yellow sputum. Pt unable to sleep at night, denies fever.

## 2015-09-09 NOTE — Discharge Instructions (Signed)
Read the information below.  Use the prescribed medication as directed.  Please discuss all new medications with your pharmacist.  You may return to the Emergency Department at any time for worsening condition or any new symptoms that concern you.    If you develop high fevers that do not resolve with tylenol or ibuprofen, you have difficulty swallowing or breathing, or you are unable to tolerate fluids by mouth, return to the ER for a recheck.     Please recheck your blood pressure within the next week.  Follow up with your primary care provider     How to Use an Inhaler Using your inhaler correctly is very important. Good technique will make sure that the medicine reaches your lungs.  HOW TO USE AN INHALER: 1. Take the cap off the inhaler. 2. If this is the first time using your inhaler, you need to prime it. Shake the inhaler for 5 seconds. Release four puffs into the air, away from your face. Ask your doctor for help if you have questions. 3. Shake the inhaler for 5 seconds. 4. Turn the inhaler so the bottle is above the mouthpiece. 5. Put your pointer finger on top of the bottle. Your thumb holds the bottom of the inhaler. 6. Open your mouth. 7. Either hold the inhaler away from your mouth (the width of 2 fingers) or place your lips tightly around the mouthpiece. Ask your doctor which way to use your inhaler. 8. Breathe out as much air as possible. 9. Breathe in and push down on the bottle 1 time to release the medicine. You will feel the medicine go in your mouth and throat. 10. Continue to take a deep breath in very slowly. Try to fill your lungs. 11. After you have breathed in completely, hold your breath for 10 seconds. This will help the medicine to settle in your lungs. If you cannot hold your breath for 10 seconds, hold it for as long as you can before you breathe out. 12. Breathe out slowly, through pursed lips. Whistling is an example of pursed lips. 13. If your doctor has told  you to take more than 1 puff, wait at least 15-30 seconds between puffs. This will help you get the best results from your medicine. Do not use the inhaler more than your doctor tells you to. 14. Put the cap back on the inhaler. 15. Follow the directions from your doctor or from the inhaler package about cleaning the inhaler. If you use more than one inhaler, ask your doctor which inhalers to use and what order to use them in. Ask your doctor to help you figure out when you will need to refill your inhaler.  If you use a steroid inhaler, always rinse your mouth with water after your last puff, gargle and spit out the water. Do not swallow the water. GET HELP IF:  The inhaler medicine only partially helps to stop wheezing or shortness of breath.  You are having trouble using your inhaler.  You have some increase in thick spit (phlegm). GET HELP RIGHT AWAY IF:  The inhaler medicine does not help your wheezing or shortness of breath or you have tightness in your chest.  You have dizziness, headaches, or fast heart rate.  You have chills, fever, or night sweats.  You have a large increase of thick spit, or your thick spit is bloody. MAKE SURE YOU:   Understand these instructions.  Will watch your condition.  Will get help right  away if you are not doing well or get worse.   This information is not intended to replace advice given to you by your health care provider. Make sure you discuss any questions you have with your health care provider.   Document Released: 07/07/2008 Document Revised: 07/19/2013 Document Reviewed: 04/27/2013 Elsevier Interactive Patient Education [employer].

## 2015-09-20 ENCOUNTER — Ambulatory Visit: Payer: Medicaid Other

## 2015-12-01 ENCOUNTER — Encounter (HOSPITAL_COMMUNITY): Payer: Self-pay | Admitting: Emergency Medicine

## 2015-12-01 ENCOUNTER — Emergency Department (HOSPITAL_COMMUNITY)
Admission: EM | Admit: 2015-12-01 | Discharge: 2015-12-01 | Disposition: A | Discharge status: Home / Self Care | Payer: Medicare Other | Attending: Emergency Medicine | Admitting: Emergency Medicine

## 2015-12-01 DIAGNOSIS — S63501A Unspecified sprain of right wrist, initial encounter: Secondary | ICD-10-CM | POA: Diagnosis not present

## 2015-12-01 DIAGNOSIS — I10 Essential (primary) hypertension: Secondary | ICD-10-CM | POA: Diagnosis not present

## 2015-12-01 DIAGNOSIS — S59911A Unspecified injury of right forearm, initial encounter: Secondary | ICD-10-CM | POA: Insufficient documentation

## 2015-12-01 DIAGNOSIS — Y998 Other external cause status: Secondary | ICD-10-CM | POA: Diagnosis not present

## 2015-12-01 DIAGNOSIS — X58XXXA Exposure to other specified factors, initial encounter: Secondary | ICD-10-CM | POA: Insufficient documentation

## 2015-12-01 DIAGNOSIS — Y9389 Activity, other specified: Secondary | ICD-10-CM | POA: Diagnosis not present

## 2015-12-01 DIAGNOSIS — F17210 Nicotine dependence, cigarettes, uncomplicated: Secondary | ICD-10-CM | POA: Diagnosis not present

## 2015-12-01 DIAGNOSIS — R200 Anesthesia of skin: Secondary | ICD-10-CM | POA: Diagnosis not present

## 2015-12-01 DIAGNOSIS — Z880 Allergy status to penicillin: Secondary | ICD-10-CM | POA: Diagnosis not present

## 2015-12-01 DIAGNOSIS — Z79899 Other long term (current) drug therapy: Secondary | ICD-10-CM | POA: Insufficient documentation

## 2015-12-01 DIAGNOSIS — Y9289 Other specified places as the place of occurrence of the external cause: Secondary | ICD-10-CM | POA: Diagnosis not present

## 2015-12-01 DIAGNOSIS — S6991XA Unspecified injury of right wrist, hand and finger(s), initial encounter: Secondary | ICD-10-CM | POA: Diagnosis present

## 2015-12-01 MED ORDER — NAPROXEN 500 MG PO TABS: 500.0000 mg | ORAL_TABLET | Freq: Two times a day (BID) | ORAL | Status: DC

## 2015-12-01 MED ORDER — NAPROXEN 250 MG PO TABS
500.0000 mg | ORAL_TABLET | Freq: Once | ORAL | Status: AC
  Administered 2015-12-01: 500 mg via ORAL
  Filled 2015-12-01: qty 2

## 2015-12-01 NOTE — ED Provider Notes (Signed)
CSN: [id number]     Arrival date & time 12/01/15  1230 History  By signing my name below, I, Kelsey Bridges, attest that this documentation has been prepared under the direction and in the presence of [employer], PA-C. Electronically Signed: Julien Bridges, ED Scribe. 12/01/2015. 1:07 PM.    Chief Complaint  Patient presents with  . Arm Pain    The history is provided by the patient. No language interpreter was used.   HPI Comments: Kelsey Bridges is a 65 y.o. female who presents to the Emergency Department complaining of constant, gradually worsening, moderate right wrist pain that radiates to her right forearm onset yesterday evening. Pt states yesterday she was wringing out a mop and injured her right wrist. She states her right middle finger and right ring finger began to feel numb and start to swell. Pt endorses increased pain with movement. She did not take any medication to alleviate her pain. Pt denies any other injury.  Past Medical History  Diagnosis Date  . Abnormal Pap smear   . Hypertension   . Hammer toe    Past Surgical History  Procedure Laterality Date  . Laparoscopic supracervical hysterectomy    . Left ovary  1988  . Varicose veins    . Abdominal hysterectomy     Family History  Problem Relation Age of Onset  . Cancer Brother     liver  . Cancer Sister     breast   Social History  Substance Use Topics  . Smoking status: Current Every Day Smoker -- 0.25 packs/day    Types: Cigarettes  . Smokeless tobacco: Current User    Types: Snuff  . Alcohol Use: Yes     Comment: "at the holidays maybe"   OB History    Gravida Para Term Preterm AB TAB SAB Ectopic Multiple Living   2 1 1  1  1   1      Review of Systems  Constitutional: Negative for fever.  HENT: Negative for congestion.   Eyes: Negative for visual disturbance.  Respiratory: Negative for cough and shortness of breath.   Cardiovascular: Negative for chest pain.  Gastrointestinal: Negative for  abdominal distention.  Genitourinary: Negative for dysuria.  Musculoskeletal: Positive for arthralgias.  Allergic/Immunologic: Negative for immunocompromised state.  Neurological: Positive for numbness.  Hematological: Does not bruise/bleed easily.      Allergies  Penicillins  Home Medications   Prior to Admission medications   Medication Sig Start Date End Date Taking? Authorizing Provider  azithromycin (ZITHROMAX) 250 MG tablet Take 1 tablet (250 mg total) by mouth daily. Take first 2 tablets together, then 1 every day until finished. 09/09/15   Clayton Bibles, PA-C  benzonatate (TESSALON) 100 MG capsule Take 1 capsule (100 mg total) by mouth 2 (two) times daily as needed for cough. 09/09/15   Clayton Bibles, PA-C  estrogens, conjugated, (PREMARIN) 0.3 MG tablet Take 1 tablet (0.3 mg total) by mouth daily. 08/12/15   Luvenia Redden, PA-C  ibuprofen (ADVIL,MOTRIN) 200 MG tablet Take 400 mg by mouth every 6 (six) hours as needed for fever, headache or mild pain.     Historical Provider, MD  lisinopril (PRINIVIL,ZESTRIL) 20 MG tablet Take 1 tablet (20 mg total) by mouth daily. 02/07/14   Malvin Johns, MD  MELATONIN PO Take 1 tablet by mouth at bedtime as needed ([employer] Brand of Melatonin).    Historical Provider, MD  naproxen (NAPROSYN) 500 MG tablet Take 1 tablet (500 mg total)  by mouth 2 (two) times daily. 12/01/15   Osmin Welz Pilcher Guiselle Mian, PA-C  predniSONE (STERAPRED UNI-PAK 21 TAB) 10 MG (21) TBPK tablet Take 1 tablet (10 mg total) by mouth daily. Day 1: take 6 tabs.  Day 2: 5 tabs  Day 3: 4 tabs  Day 4: 3 tabs  Day 5: 2 tabs  Day 6: 1 tab 09/09/15   Clayton Bibles, PA-C  sertraline (ZOLOFT) 25 MG tablet Take 25 mg by mouth daily.    Historical Provider, MD   Triage vitals: BP 186/101 mmHg  Pulse 63  Temp(Src) 97.9 F (36.6 C) (Oral)  Resp 16  Ht 5\' 1"  (1.549 m)  Wt 145 lb (65.772 kg)  BMI 27.41 kg/m2  SpO2 100% Physical Exam  Constitutional: She is oriented to person, place, and time.  She appears well-developed and well-nourished. No distress.  HENT:  Head: Normocephalic and atraumatic.  Neck: Normal range of motion. Neck supple.  Cardiovascular: Normal rate, regular rhythm and normal heart sounds.   Pulmonary/Chest: Effort normal and breath sounds normal. No respiratory distress.  Abdominal: Soft. She exhibits no distension. There is no tenderness.  Musculoskeletal:  No gross deformity noted. Patient has full active and passive range of motion. There is no joint effusion noted. No erythema, or warmth overlaying the joint. There is tenderness to palpation over the anterior forearm musculature. + Tinel's. No anatomic snuff box tenderness. 2+ Radial pulse.  Neurological: She is alert and oriented to person, place, and time. No sensory deficit.  Skin: Skin is warm and dry.  Psychiatric: She has a normal mood and affect. Her behavior is normal.  Nursing note and vitals reviewed.   ED Course  Procedures  DIAGNOSTIC STUDIES: Oxygen Saturation is 100% on RA, normal by my interpretation.  COORDINATION OF CARE:  1:06 PM Discussed treatment plan which includes anti-inflammatory medication and wrist splint with pt at bedside and pt agreed to plan.  Labs Review Labs Reviewed - No data to display  Imaging Review No results found. I have personally reviewed and evaluated these images and lab results as part of my medical decision-making.   EKG Interpretation None      MDM   Final diagnoses:  Wrist sprain, right, initial encounter   Kelsey Bridges presents with wrist pain after injury while wringing mop and cleaning home yesterday. On exam, she has tenderness along flexor tendons and + tinel's. Will give wrist splint. RICE therapy discussed. Rx for naproxen given. Ortho follow up if symptoms do not improve. Return precautions discussed.   I personally performed the services described in this documentation, which was scribed in my presence. The recorded information  has been reviewed and is accurate.  [facility] Elke Holtry, PA-C 12/01/15 1329  Charlesetta Shanks, MD 12/01/15 [phone]

## 2015-12-01 NOTE — Discharge Instructions (Signed)
1. Medications: Take anti-inflammatory as prescribed, continue usual home medications 2. Treatment: rest, drink plenty of fluids, ice affected area (instructions below), wear wrist splint (especially at night) 3. Follow Up: Please follow up with your primary doctor or the orthopedic clinic listed if pain persists in 7 days for discussion of your diagnoses and further evaluation after today's visit; Please return to the ER for new or worsening symptoms, any additional concerns.

## 2015-12-01 NOTE — ED Notes (Signed)
Started yesterday, after wringing a mop, with right wrist/forearm pain. Slight redness and swelling.

## 2015-12-09 DIAGNOSIS — G5601 Carpal tunnel syndrome, right upper limb: Secondary | ICD-10-CM | POA: Insufficient documentation

## 2015-12-09 DIAGNOSIS — M79641 Pain in right hand: Secondary | ICD-10-CM | POA: Insufficient documentation

## 2015-12-23 ENCOUNTER — Ambulatory Visit (HOSPITAL_COMMUNITY)
Admission: RE | Admit: 2015-12-23 | Discharge: 2015-12-23 | Disposition: A | Discharge status: Home / Self Care | Payer: Medicare Other | Source: Ambulatory Visit | Attending: Primary Care | Admitting: Primary Care

## 2015-12-23 ENCOUNTER — Other Ambulatory Visit (HOSPITAL_COMMUNITY): Payer: Self-pay | Admitting: Primary Care

## 2015-12-23 DIAGNOSIS — R0989 Other specified symptoms and signs involving the circulatory and respiratory systems: Secondary | ICD-10-CM | POA: Diagnosis present

## 2016-10-15 ENCOUNTER — Emergency Department (HOSPITAL_COMMUNITY): Payer: Medicare Other

## 2016-10-15 ENCOUNTER — Encounter (HOSPITAL_COMMUNITY): Payer: Self-pay | Admitting: Emergency Medicine

## 2016-10-15 ENCOUNTER — Emergency Department (HOSPITAL_COMMUNITY)
Admission: EM | Admit: 2016-10-15 | Discharge: 2016-10-15 | Disposition: A | Discharge status: Home / Self Care | Payer: Medicare Other | Attending: Emergency Medicine | Admitting: Emergency Medicine

## 2016-10-15 DIAGNOSIS — W2201XA Walked into wall, initial encounter: Secondary | ICD-10-CM | POA: Insufficient documentation

## 2016-10-15 DIAGNOSIS — Y929 Unspecified place or not applicable: Secondary | ICD-10-CM | POA: Diagnosis not present

## 2016-10-15 DIAGNOSIS — S40011A Contusion of right shoulder, initial encounter: Secondary | ICD-10-CM | POA: Insufficient documentation

## 2016-10-15 DIAGNOSIS — Y9341 Activity, dancing: Secondary | ICD-10-CM | POA: Insufficient documentation

## 2016-10-15 DIAGNOSIS — S39012A Strain of muscle, fascia and tendon of lower back, initial encounter: Secondary | ICD-10-CM | POA: Diagnosis not present

## 2016-10-15 DIAGNOSIS — Y999 Unspecified external cause status: Secondary | ICD-10-CM | POA: Diagnosis not present

## 2016-10-15 DIAGNOSIS — M546 Pain in thoracic spine: Secondary | ICD-10-CM | POA: Diagnosis not present

## 2016-10-15 DIAGNOSIS — W19XXXA Unspecified fall, initial encounter: Secondary | ICD-10-CM

## 2016-10-15 DIAGNOSIS — S3992XA Unspecified injury of lower back, initial encounter: Secondary | ICD-10-CM | POA: Diagnosis present

## 2016-10-15 DIAGNOSIS — Z79899 Other long term (current) drug therapy: Secondary | ICD-10-CM | POA: Insufficient documentation

## 2016-10-15 DIAGNOSIS — F17210 Nicotine dependence, cigarettes, uncomplicated: Secondary | ICD-10-CM | POA: Insufficient documentation

## 2016-10-15 DIAGNOSIS — I10 Essential (primary) hypertension: Secondary | ICD-10-CM | POA: Diagnosis not present

## 2016-10-15 MED ORDER — ACETAMINOPHEN 500 MG PO TABS
1000.0000 mg | ORAL_TABLET | Freq: Once | ORAL | Status: AC
  Administered 2016-10-15: 1000 mg via ORAL
  Filled 2016-10-15: qty 2

## 2016-10-15 NOTE — ED Notes (Signed)
Pt refused dc VS 

## 2016-10-15 NOTE — ED Triage Notes (Signed)
Pt here for fall x 5 days ago; pt sts pain in back and right shoulder

## 2016-10-15 NOTE — ED Provider Notes (Signed)
[facility] DEPT Provider Note   CSN: [id number] Arrival date & time: 10/15/16  1550   By signing my name below, I, Neta Mends, attest that this documentation has been prepared under the direction and in the presence of Sharlett Iles, MD . Electronically Signed: Neta Mends, ED Scribe. 10/15/2016. 6:13 PM.   History   Chief Complaint Chief Complaint  Patient presents with  . Fall    The history is provided by the patient. No language interpreter was used.   HPI Comments:  Kelsey Bridges is a 66 y.o. female who presents to the Emergency Department, here due to a fall that occurred 5 days ago. Pt reports that she was dancing and fell backwards, hitting a wall on her right side, and then fell down. Pt also complains of constant right arm pain and lower back/hip pain. Pt states that the pain is worsened with movement. Pt states that after she hit her head, she was stunned for a short period but remembers people talking to her the entire time. Pt put a cold towel on her head after the fall and has taken hot baths with mild relief. Pt has not taken any medications for relief. Pt denies visual changes.    Past Medical History:  Diagnosis Date  . Abnormal Pap smear   . Hammer toe   . Hypertension     Patient Active Problem List   Diagnosis Date Noted  . LSIL (low grade squamous intraepithelial lesion) on Pap smear 06/08/2011    Past Surgical History:  Procedure Laterality Date  . ABDOMINAL HYSTERECTOMY    . LAPAROSCOPIC SUPRACERVICAL HYSTERECTOMY    . left ovary  1988  . Varicose veins      OB History    Gravida Para Term Preterm AB Living   2 1 1   1 1    SAB TAB Ectopic Multiple Live Births   1               Home Medications    Prior to Admission medications   Medication Sig Start Date End Date Taking? Authorizing Provider  azithromycin (ZITHROMAX) 250 MG tablet Take 1 tablet (250 mg total) by mouth daily. Take first 2 tablets together,  then 1 every day until finished. 09/09/15   Clayton Bibles, PA-C  benzonatate (TESSALON) 100 MG capsule Take 1 capsule (100 mg total) by mouth 2 (two) times daily as needed for cough. 09/09/15   Clayton Bibles, PA-C  estrogens, conjugated, (PREMARIN) 0.3 MG tablet Take 1 tablet (0.3 mg total) by mouth daily. 08/12/15   Luvenia Redden, PA-C  ibuprofen (ADVIL,MOTRIN) 200 MG tablet Take 400 mg by mouth every 6 (six) hours as needed for fever, headache or mild pain.     Historical Provider, MD  lisinopril (PRINIVIL,ZESTRIL) 20 MG tablet Take 1 tablet (20 mg total) by mouth daily. 02/07/14   Malvin Johns, MD  MELATONIN PO Take 1 tablet by mouth at bedtime as needed ([employer] of Melatonin).    Historical Provider, MD  naproxen (NAPROSYN) 500 MG tablet Take 1 tablet (500 mg total) by mouth 2 (two) times daily. 12/01/15   Jaime Pilcher Ward, PA-C  predniSONE (STERAPRED UNI-PAK 21 TAB) 10 MG (21) TBPK tablet Take 1 tablet (10 mg total) by mouth daily. Day 1: take 6 tabs.  Day 2: 5 tabs  Day 3: 4 tabs  Day 4: 3 tabs  Day 5: 2 tabs  Day 6: 1 tab 09/09/15   Raquel Sarna  West, PA-C  sertraline (ZOLOFT) 25 MG tablet Take 25 mg by mouth daily.    Historical Provider, MD    Family History Family History  Problem Relation Age of Onset  . Cancer Brother     liver  . Cancer Sister     breast    Social History Social History  Substance Use Topics  . Smoking status: Current Every Day Smoker    Packs/day: 0.25    Types: Cigarettes  . Smokeless tobacco: Current User    Types: Snuff  . Alcohol use Yes     Comment: "at the holidays maybe"     Allergies   Penicillins   Review of Systems Review of Systems 10 Systems reviewed and are negative for acute change except as noted in the HPI.   Physical Exam Updated Vital Signs BP 145/95 (BP Location: Right Arm)   Pulse 79   Temp 97.8 F (36.6 C) (Oral)   Resp 18   SpO2 100%   Physical Exam  Constitutional: She is oriented to person, place, and time.  She appears well-developed and well-nourished. No distress.  Awake, alert  HENT:  Head: Normocephalic and atraumatic.  Eyes: Conjunctivae and EOM are normal. Pupils are equal, round, and reactive to light.  Neck: Neck supple.  Cardiovascular: Normal rate, regular rhythm and normal heart sounds.   No murmur heard. Pulmonary/Chest: Effort normal and breath sounds normal. No respiratory distress.  Abdominal: Soft. Bowel sounds are normal. She exhibits no distension. There is no tenderness.  Musculoskeletal: She exhibits no edema.  Tenderness of R anterior shoulder with small hematoma, no obvious deformity; tenderness to very light touch of R mid-thoracic paraspinal muscles and across lumbar back, no obvious stepoff  Neurological: She is alert and oriented to person, place, and time. She has normal reflexes. No cranial nerve deficit. She exhibits normal muscle tone.  Fluent speech 5/5 strength and normal sensation x all 4 extremities  Skin: Skin is warm and dry.  No ecchymoses on back  Psychiatric: She has a normal mood and affect.  Nursing note and vitals reviewed.    ED Treatments / Results  DIAGNOSTIC STUDIES:  Oxygen Saturation is 100% on RA, normal by my interpretation.    COORDINATION OF CARE:  6:13 PM Discussed treatment plan with pt at bedside and pt agreed to plan.   Labs (all labs ordered are listed, but only abnormal results are displayed) Labs Reviewed - No data to display  EKG  EKG Interpretation None       Radiology Dg Chest 2 View  Result Date: 10/15/2016 CLINICAL DATA:  Rib pain after fall 5 days ago. EXAM: CHEST  2 VIEW COMPARISON:  Radiographs of December 23, 2015. FINDINGS: The heart size and mediastinal contours are within normal limits. Both lungs are clear. No pneumothorax or pleural effusion is noted. The visualized skeletal structures are unremarkable. IMPRESSION: No active cardiopulmonary disease. Electronically Signed   By: Marijo Conception, M.D.   On:  10/15/2016 19:28   Dg Thoracic Spine 2 View  Result Date: 10/15/2016 CLINICAL DATA:  Acute thoracic spine pain after fall 5 days ago. EXAM: THORACIC SPINE 2 VIEWS COMPARISON:  None. FINDINGS: There is no evidence of thoracic spine fracture. Alignment is normal. No other significant bone abnormalities are identified. IMPRESSION: Normal thoracic spine. Electronically Signed   By: Marijo Conception, M.D.   On: 10/15/2016 19:26   Dg Lumbar Spine Complete  Result Date: 10/15/2016 CLINICAL DATA:  Acute lower back pain  after fall 5 days ago. EXAM: LUMBAR SPINE - COMPLETE 4+ VIEW COMPARISON:  None. FINDINGS: No fracture or spondylolisthesis is noted. Mild degenerative disc disease is noted at L2-3 with anterior osteophyte formation. Posterior facet joints are unremarkable. IMPRESSION: Mild degenerative disc disease is noted at L2-3. No acute abnormality seen in the lumbar spine. Electronically Signed   By: Marijo Conception, M.D.   On: 10/15/2016 19:25   Dg Shoulder Right  Result Date: 10/15/2016 CLINICAL DATA:  Right shoulder pain status post fall backwards. EXAM: RIGHT SHOULDER - 2+ VIEW COMPARISON:  None. FINDINGS: There is no evidence of fracture or dislocation. Mild osteoarthritic changes of glenohumeral joint noted. Soft tissues are unremarkable. IMPRESSION: No acute fracture or dislocation identified about the right shoulder. 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 Electronically Signed   By: Fidela Salisbury M.D.   On: 10/15/2016 19:23    Procedures Procedures (including critical care time)  Medications Ordered in ED Medications  acetaminophen (TYLENOL) tablet 1,000 mg (1,000 mg Oral Given 10/15/16 1821)     Initial Impression / Assessment and Plan / ED Course  I have reviewed the triage vital signs and the nursing notes.  Pertinent maging results that were available during my care of the patient were reviewed by me and considered in my medical decision making (see chart for  details).  Clinical Course    Pt w/ Persistent back and right shoulder pain after a fall 5 days ago. No obvious bruising or abrasions on exam, the exception of small hematoma right anterior shoulder. She was neurologically intact. Given that she is 5 days out from her injury with normal neurologic exam, I do not feel she needs any head imaging at this time. Obtained plain films of back, chest, and right shoulder. Gave Tylenol.  Plain films negative acute. Pt ambulatory without difficulty in ED. Discussed supportive care for muscle strain reviewed return precautions. Patient discharged in satisfactory condition.  Final Clinical Impressions(s) / ED Diagnoses   Final diagnoses:  Fall, initial encounter  Back strain, initial encounter    New Prescriptions New Prescriptions   No medications on file  I personally performed the services described in this documentation, which was scribed in my presence. The recorded information has been reviewed and is accurate.     Sharlett Iles, MD 10/15/16 [phone]

## 2017-02-26 ENCOUNTER — Other Ambulatory Visit: Payer: Self-pay | Admitting: Primary Care

## 2017-02-26 DIAGNOSIS — Z1231 Encounter for screening mammogram for malignant neoplasm of breast: Secondary | ICD-10-CM

## 2017-03-16 ENCOUNTER — Ambulatory Visit: Payer: Medicare Other

## 2017-10-12 HISTORY — PX: HAMMER TOE SURGERY: SHX385

## 2018-01-27 IMAGING — DX DG CHEST 2V
2 series · 2 of 2 positions shown · non-contrast
Comparison: Radiographs December 23, 2015.

CLINICAL DATA: Rib pain after fall 5 days ago.

EXAM:
CHEST  2 VIEW

[chest pa]
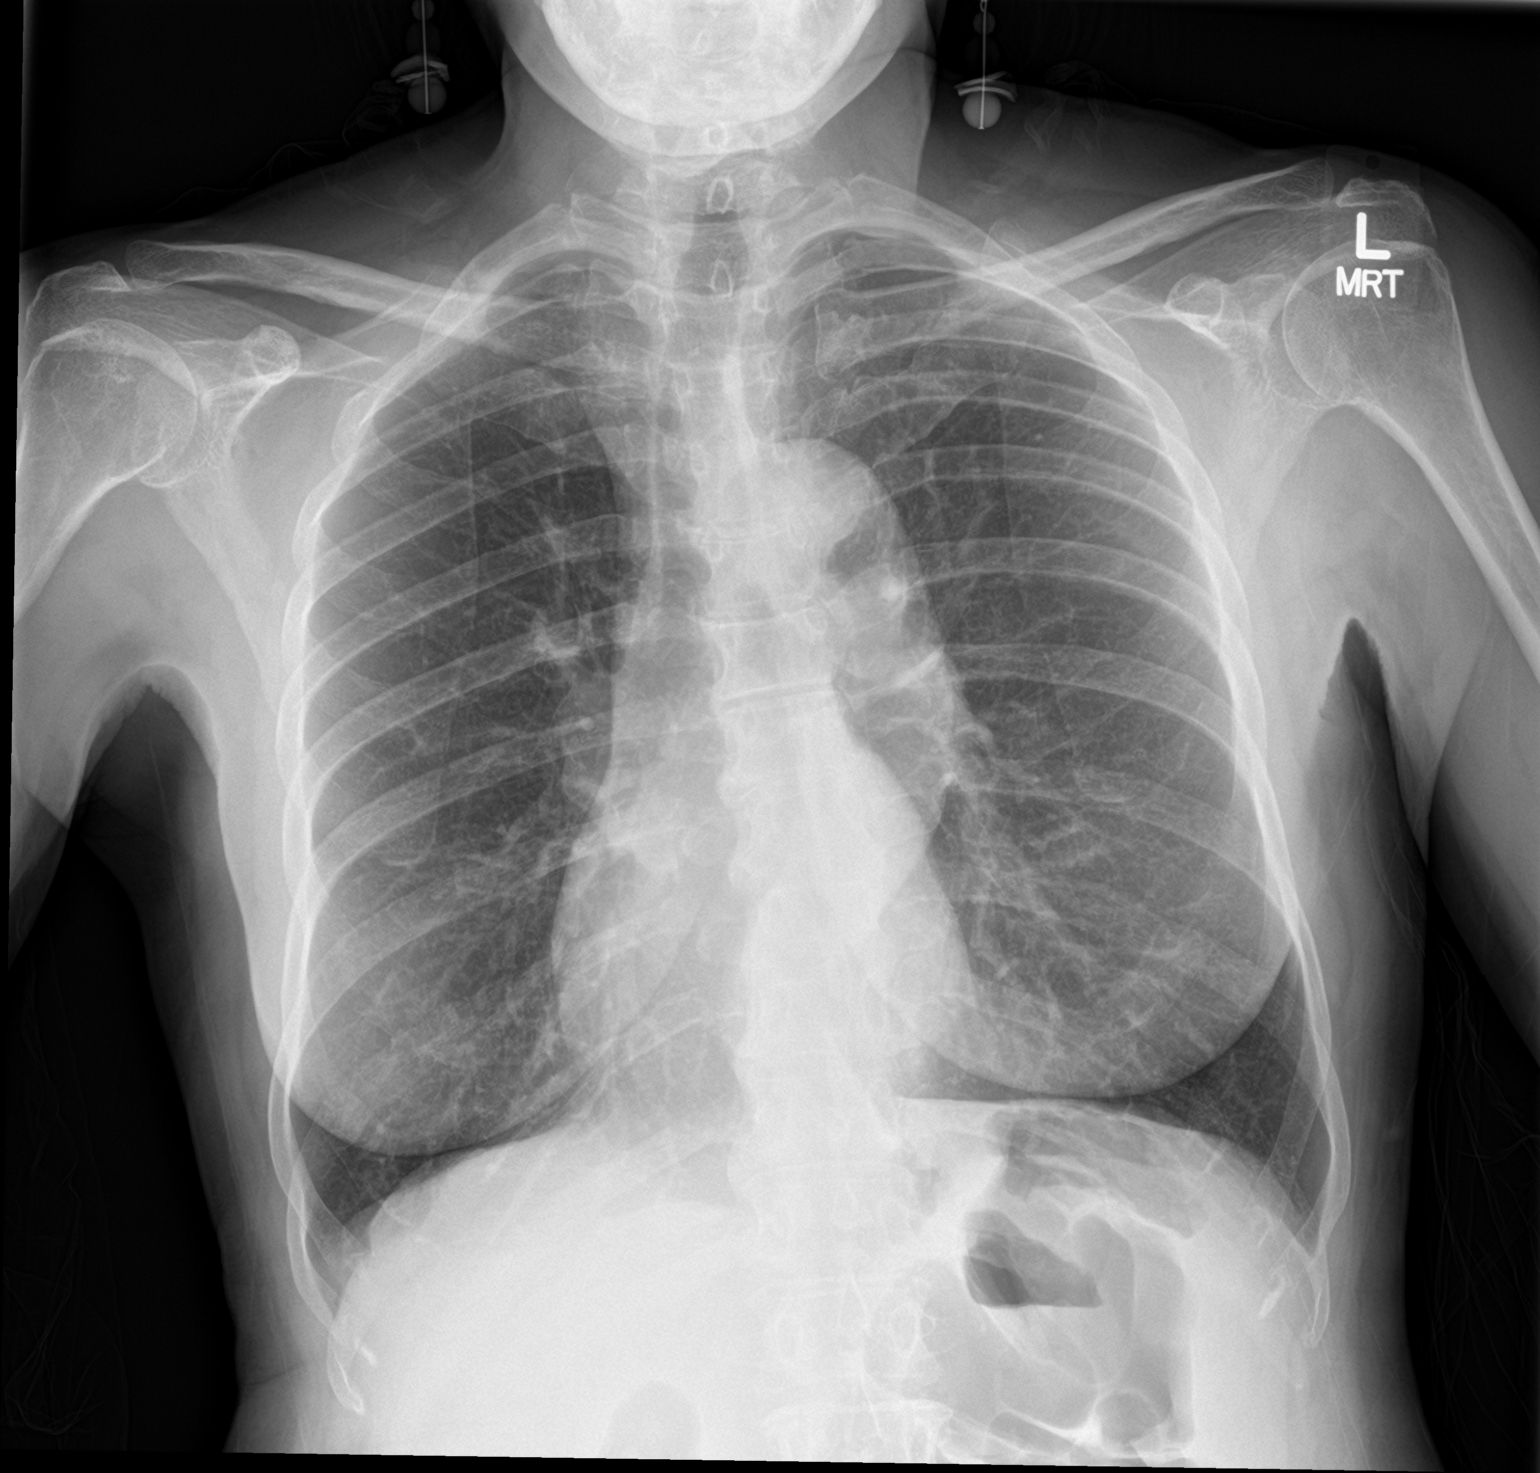

[chest lat]
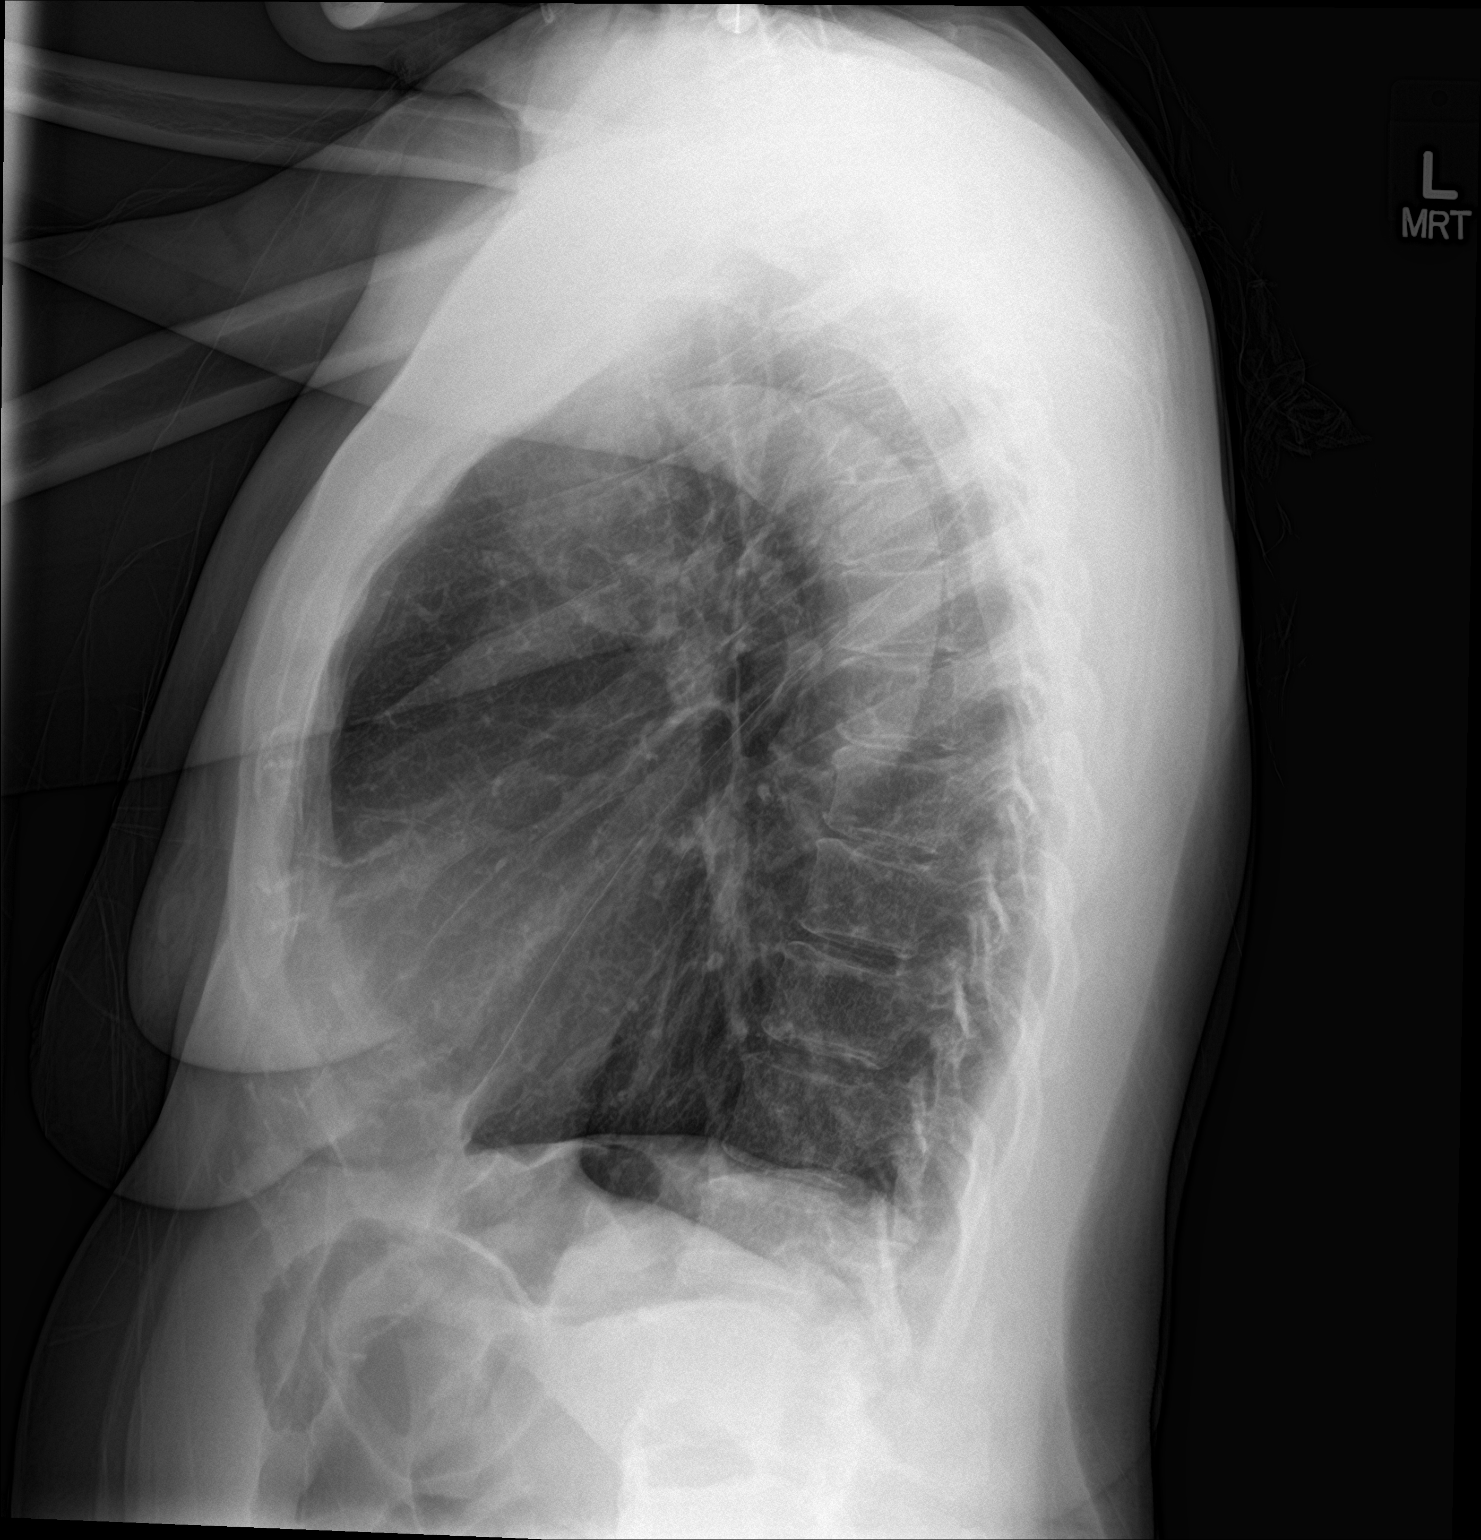

[2 of 2 positions shown; findings below may reference images not displayed]

FINDINGS: The heart size and mediastinal contours are within normal limits.
Both lungs are clear. No pneumothorax or pleural effusion is noted.
The visualized skeletal structures are unremarkable.
IMPRESSION: No active cardiopulmonary disease.

## 2018-03-02 ENCOUNTER — Other Ambulatory Visit: Payer: Self-pay | Admitting: Nurse Practitioner

## 2018-03-02 DIAGNOSIS — Z1231 Encounter for screening mammogram for malignant neoplasm of breast: Secondary | ICD-10-CM

## 2018-03-22 ENCOUNTER — Ambulatory Visit: Payer: Medicare Other

## 2018-12-28 ENCOUNTER — Emergency Department (HOSPITAL_COMMUNITY): Payer: Medicare HMO

## 2018-12-28 ENCOUNTER — Other Ambulatory Visit: Payer: Self-pay

## 2018-12-28 ENCOUNTER — Encounter (HOSPITAL_COMMUNITY): Payer: Self-pay | Admitting: Emergency Medicine

## 2018-12-28 ENCOUNTER — Emergency Department (HOSPITAL_COMMUNITY)
Admission: EM | Admit: 2018-12-28 | Discharge: 2018-12-28 | Disposition: A | Discharge status: Home / Self Care | Payer: Medicare HMO | Attending: Emergency Medicine | Admitting: Emergency Medicine

## 2018-12-28 DIAGNOSIS — I10 Essential (primary) hypertension: Secondary | ICD-10-CM | POA: Insufficient documentation

## 2018-12-28 DIAGNOSIS — F17210 Nicotine dependence, cigarettes, uncomplicated: Secondary | ICD-10-CM | POA: Diagnosis not present

## 2018-12-28 DIAGNOSIS — N3000 Acute cystitis without hematuria: Secondary | ICD-10-CM | POA: Insufficient documentation

## 2018-12-28 DIAGNOSIS — R509 Fever, unspecified: Secondary | ICD-10-CM | POA: Diagnosis present

## 2018-12-28 DIAGNOSIS — Z760 Encounter for issue of repeat prescription: Secondary | ICD-10-CM

## 2018-12-28 LAB — COMPREHENSIVE METABOLIC PANEL
ALBUMIN: 4.4 g/dL (ref 3.5–5.0)
ALT: 17 U/L (ref 0–44)
ANION GAP: 11 (ref 5–15)
AST: 22 U/L (ref 15–41)
Alkaline Phosphatase: 71 U/L (ref 38–126)
BUN: 16 mg/dL (ref 8–23)
CO2: 22 mmol/L (ref 22–32)
CREATININE: 0.91 mg/dL (ref 0.44–1.00)
Calcium: 9.5 mg/dL (ref 8.9–10.3)
Chloride: 105 mmol/L (ref 98–111)
GFR calc Af Amer: >60 mL/min (ref >60)
GFR calc non Af Amer: >60 mL/min (ref >60)
GLUCOSE: 120 mg/dL — AB (ref 70–99)
Potassium: 3.7 mmol/L (ref 3.5–5.1)
SODIUM: 138 mmol/L (ref 135–145)
Total Bilirubin: 1 mg/dL (ref 0.3–1.2)
Total Protein: 8.5 g/dL — ABNORMAL HIGH (ref 6.5–8.1)

## 2018-12-28 LAB — URINALYSIS, ROUTINE W REFLEX MICROSCOPIC
Bilirubin Urine: NEGATIVE
Glucose, UA: NEGATIVE mg/dL
Ketones, ur: NEGATIVE mg/dL
Nitrite: POSITIVE — AB
Protein, ur: NEGATIVE mg/dL
Specific Gravity, Urine: 1.008 (ref 1.005–1.030)
pH: 6 (ref 5.0–8.0)

## 2018-12-28 LAB — CBC WITH DIFFERENTIAL/PLATELET
Abs Immature Granulocytes: 0.04 10*3/uL (ref 0.00–0.07)
BASOS ABS: 0 10*3/uL (ref 0.0–0.1)
Basophils Relative: 1 %
EOS PCT: 0 %
Eosinophils Absolute: 0 10*3/uL (ref 0.0–0.5)
HCT: 42.9 % (ref 36.0–46.0)
Hemoglobin: 13.7 g/dL (ref 12.0–15.0)
Immature Granulocytes: 1 %
Lymphocytes Relative: 29 %
Lymphs Abs: 2.2 10*3/uL (ref 0.7–4.0)
MCH: 29.5 pg (ref 26.0–34.0)
MCHC: 31.9 g/dL (ref 30.0–36.0)
MCV: 92.3 fL (ref 80.0–100.0)
MONO ABS: 0.5 10*3/uL (ref 0.1–1.0)
Monocytes Relative: 6 %
Neutro Abs: 4.8 10*3/uL (ref 1.7–7.7)
Neutrophils Relative %: 63 %
Platelets: 230 10*3/uL (ref 150–400)
RBC: 4.65 MIL/uL (ref 3.87–5.11)
RDW: 14.3 % (ref 11.5–15.5)
WBC: 7.6 10*3/uL (ref 4.0–10.5)
nRBC: 0 % (ref 0.0–0.2)

## 2018-12-28 LAB — INFLUENZA PANEL BY PCR (TYPE A & B)
Influenza A By PCR: NEGATIVE
Influenza B By PCR: NEGATIVE

## 2018-12-28 LAB — LACTIC ACID, PLASMA: Lactic Acid, Venous: 1.7 mmol/L (ref 0.5–1.9)

## 2018-12-28 LAB — GROUP A STREP BY PCR: Group A Strep by PCR: NOT DETECTED

## 2018-12-28 MED ORDER — ONDANSETRON 4 MG PO TBDP: 4.0000 mg | ORAL_TABLET | Freq: Three times a day (TID) | ORAL | 0 refills | Status: DC | PRN

## 2018-12-28 MED ORDER — LOSARTAN POTASSIUM 50 MG PO TABS: 50.0000 mg | ORAL_TABLET | Freq: Every day | ORAL | 1 refills | Status: DC

## 2018-12-28 MED ORDER — ONDANSETRON HCL 4 MG/2ML IJ SOLN
4.0000 mg | Freq: Once | INTRAMUSCULAR | Status: AC
  Administered 2018-12-28: 4 mg via INTRAVENOUS
  Filled 2018-12-28: qty 2

## 2018-12-28 MED ORDER — SODIUM CHLORIDE 0.9 % IV BOLUS
500.0000 mL | Freq: Once | INTRAVENOUS | Status: AC
  Administered 2018-12-28: 500 mL via INTRAVENOUS

## 2018-12-28 MED ORDER — SULFAMETHOXAZOLE-TRIMETHOPRIM 800-160 MG PO TABS: 1.0000 | ORAL_TABLET | Freq: Two times a day (BID) | ORAL | 0 refills | Status: AC

## 2018-12-28 MED ORDER — LOSARTAN POTASSIUM 50 MG PO TABS
50.0000 mg | ORAL_TABLET | Freq: Every day | ORAL | Status: DC
  Administered 2018-12-28: 50 mg via ORAL
  Filled 2018-12-28: qty 1

## 2018-12-28 NOTE — Discharge Instructions (Signed)
Evaluated today for flulike symptoms.  Your blood work as well as her chest x-ray and flu test were negative.  You do have a urinary tract infection.  I have prescribed antibiotics.  Please take as prescribed.  Please follow-up with your PCP for reevaluation if you continue to have symptoms.  Return to the ED for any worsening symptoms

## 2018-12-28 NOTE — ED Provider Notes (Signed)
[facility] DEPT Provider Note   CSN: [id number] Arrival date & time: 12/28/18  1122  History   Chief Complaint Chief Complaint  Patient presents with   Fever   Cough   Sore Throat    HPI Kelsey Bridges is a 68 y.o. female with past medical history significant for hypertension who presents for evaluation of flulike symptoms.  Patient states she has had productive cough, fever, sore throat as well as loss of appetite onset 5 days ago.  Patient states she feels like her temperature has been 102.0. Patient temperature was approximately 4 days ago.  Has not taking anything for symptoms.  Patient denies sick contacts, however states she does ride public transportation and she is unsure if she has come in contact with anyone who is been sick.  Did not get her influenza vaccine.  Patient states she has been able to tolerate p.o. intake, however has had decreased appetite for solid foods.  States she has a sore throat which feels "scratchy."  Also had congestion and rhinorrhea.  Admits to mild body aches and pains as well as productive cough.  Cough productive of light yellow sputum.  Denies recent travel or recent exposure to coronavirus positive patients.  Patient states she did have an appointment with her PCP today at 3, however she was notified by nursing staff that she needs to be seen in the emergency department for her symptoms.  History obtained from patient.  No interpreter was used.     HPI  Past Medical History:  Diagnosis Date   Abnormal Pap smear    Hammer toe    Hypertension     Patient Active Problem List   Diagnosis Date Noted   LSIL (low grade squamous intraepithelial lesion) on Pap smear 06/08/2011    Past Surgical History:  Procedure Laterality Date   ABDOMINAL HYSTERECTOMY     LAPAROSCOPIC SUPRACERVICAL HYSTERECTOMY     left ovary  1988   Varicose veins       OB History    Gravida  2   Para  1   Term  1     Preterm      AB  1   Living  1     SAB  1   TAB      Ectopic      Multiple      Live Births               Home Medications    Prior to Admission medications   Medication Sig Start Date End Date Taking? Authorizing Provider  losartan (COZAAR) 50 MG tablet Take 1 tablet (50 mg total) by mouth daily. 12/28/18   Delano Frate A, PA-C  ondansetron (ZOFRAN ODT) 4 MG disintegrating tablet Take 1 tablet (4 mg total) by mouth every 8 (eight) hours as needed for nausea or vomiting. 12/28/18   Vernella Niznik A, PA-C  sulfamethoxazole-trimethoprim (BACTRIM DS,SEPTRA DS) 800-160 MG tablet Take 1 tablet by mouth 2 (two) times daily for 7 days. 12/28/18 01/04/19  Sandrea Boer A, PA-C    Family History Family History  Problem Relation Age of Onset   Cancer Brother        liver   Cancer Sister        breast    Social History Social History   Tobacco Use   Smoking status: Current Every Day Smoker    Packs/day: 0.20    Types: Cigarettes   Smokeless tobacco: Current User  Types: Snuff  Substance Use Topics   Alcohol use: Yes    Comment: "at the holidays maybe"   Drug use: No     Allergies   Penicillins   Review of Systems Review of Systems  Constitutional: Positive for appetite change, chills and fever.  HENT: Positive for congestion, postnasal drip, rhinorrhea and sore throat. Negative for sinus pressure, sinus pain, sneezing, tinnitus, trouble swallowing and voice change.   Respiratory: Positive for cough. Negative for apnea, choking, chest tightness, shortness of breath, wheezing and stridor.   Cardiovascular: Negative.   Gastrointestinal: Negative.   Genitourinary: Negative.   Musculoskeletal: Negative.   Skin: Negative.   Neurological: Negative.   All other systems reviewed and are negative.    Physical Exam Updated Vital Signs BP (!) 196/99    Pulse 71    Temp 97.9 F (36.6 C)    Resp 16    Ht 5\' 1"  (1.549 m)    Wt 68 kg    SpO2 97%     BMI 28.34 kg/m   Physical Exam Vitals signs and nursing note reviewed.  Constitutional:      General: She is not in acute distress.    Appearance: She is not ill-appearing, toxic-appearing or diaphoretic.  HENT:     Head: Normocephalic and atraumatic.     Jaw: There is normal jaw occlusion.     Right Ear: Tympanic membrane, ear canal and external ear normal. No drainage, swelling or tenderness. No middle ear effusion. There is no impacted cerumen. No hemotympanum. Tympanic membrane is not injected, scarred, perforated, erythematous, retracted or bulging.     Left Ear: Tympanic membrane, ear canal and external ear normal. No drainage, swelling or tenderness.  No middle ear effusion. There is no impacted cerumen. No hemotympanum. Tympanic membrane is not injected, scarred, perforated, erythematous, retracted or bulging.     Ears:     Comments: No Mastoid tenderness.    Nose:     Comments: Clear rhinorrhea and congestion to bilateral nares.  No sinus tenderness.    Mouth/Throat:     Comments: Posterior oropharynx clear.  Mucous membranes moist.  Tonsils without erythema or exudate.  Uvula midline without deviation.  No evidence of PTA or RPA.  No drooling, dysphasia or trismus.  Phonation normal. Neck:     Trachea: Trachea and phonation normal.     Meningeal: Brudzinski's sign and Kernig's sign absent.     Comments: No Neck stiffness or neck rigidity.  No meningismus.  No cervical lymphadenopathy. Cardiovascular:     Comments: No murmurs rubs or gallops. Pulmonary:     Comments: Clear to auscultation bilaterally without wheeze, rhonchi or rales.  No accessory muscle usage.  Able speak in full sentences. Abdominal:     Comments: Soft, nontender without rebound or guarding.  No CVA tenderness.  Musculoskeletal:     Comments: Moves all 4 extremities without difficulty.  Lower extremities without edema, erythema or warmth.  Skin:    Comments: Brisk capillary refill.  No rashes or lesions.    Neurological:     Mental Status: She is alert.     Comments: Ambulatory in department without difficulty.  Cranial nerves II through XII grossly intact.  No facial droop.  No aphasia.    ED Treatments / Results  Labs (all labs ordered are listed, but only abnormal results are displayed) Labs Reviewed  COMPREHENSIVE METABOLIC PANEL - Abnormal; Notable for the following components:      Result Value  Glucose, Bld 120 (*)    Total Protein 8.5 (*)    All other components within normal limits  URINALYSIS, ROUTINE W REFLEX MICROSCOPIC - Abnormal; Notable for the following components:   APPearance HAZY (*)    Hgb urine dipstick MODERATE (*)    Nitrite POSITIVE (*)    Leukocytes,Ua MODERATE (*)    WBC, UA >50 (*)    Bacteria, UA MANY (*)    All other components within normal limits  GROUP A STREP BY PCR  URINE CULTURE  CULTURE, BLOOD (ROUTINE X 2)  CULTURE, BLOOD (ROUTINE X 2)  CBC WITH DIFFERENTIAL/PLATELET  LACTIC ACID, PLASMA  INFLUENZA PANEL BY PCR (TYPE A & B)  LACTIC ACID, PLASMA    EKG None  Radiology Dg Chest 2 View  Result Date: 12/28/2018 CLINICAL DATA:  Cough and fever EXAM: CHEST - 2 VIEW COMPARISON:  10/15/2016 FINDINGS: Cardiac shadows within normal limits. The lungs are hyperinflated. No focal infiltrate or sizable effusion is seen. No acute bony abnormality is noted. IMPRESSION: No acute abnormality seen. Electronically Signed   By: Inez Catalina M.D.   On: 12/28/2018 11:55    Procedures Procedures (including critical care time)  Medications Ordered in ED Medications  losartan (COZAAR) tablet 50 mg (50 mg Oral Given 12/28/18 1413)  sodium chloride 0.9 % bolus 500 mL (0 mLs Intravenous Stopped 12/28/18 1333)  ondansetron (ZOFRAN) injection 4 mg (4 mg Intravenous Given 12/28/18 1223)     Initial Impression / Assessment and Plan / ED Course  I have reviewed the triage vital signs and the nursing notes.  Pertinent labs & imaging results that were available  during my care of the patient were reviewed by me and considered in my medical decision making (see chart for details).  68 year old female is otherwise well presents for evaluation of flulike symptoms.  Febrile 1 week ago with body aches and pains, cough.  No cough x5 days.  Take anything for symptoms PTA.  Lungs clear to auscultation bilateral without wheeze, rhonchi or rales.  No accessory muscle usage.  Able speak in full sentences without difficulty.  Mucous membranes moist.  Mild clear rhinorrhea to bilateral nares.  Abdomen soft, nontender without rebound or guarding.  No CVA tenderness.  No neck stiffness, neck rigidity, no meningismus, low suspicion for meningitis.  Will obtain labs, urine, imaging and reevaluate.  DDX: Pneumonia, Sepsis, PE, MI, dehydration, electrolyte abnormality, cystitis, Viral URI, influenza, coronavirus, Meningitis,   CBC without leukocytosis, Metabolic panel with mild hyperglycemia at 120, no additional electrolyte, renal or liver abnormality, chest x-ray negative for infiltrates, cardiomegaly,  pneumothorax, lactic acid 1.7.  Urinalysis positive for infection.  Low suspicion for pyelonephritis as patient has no CVA tenderness.  She actually does not have any current urinary symptoms on exam however urine is grossly positive.  Will treat with Bactrim given medication allergies.  Patient feels improvement with symptoms and Zofran.  Will p.o. trial and reevaluate.  Patient able to tolerate p.o. intake without difficulty.  On reevaluation abdomen soft  Patient is nontoxic, nonseptic appearing, in no apparent distress.  Patient's pain and other symptoms adequately managed in emergency department.  Fluid bolus given.  Labs, imaging and vitals reviewed.  Patient does not meet the SIRS or Sepsis criteria.  On repeat exam patient does not have a surgical abdomin and there are no peritoneal signs.  No indication of appendicitis, bowel obstruction, bowel perforation, cholecystitis,  diverticulitis.  Patient discharged home with symptomatic treatment and given strict instructions  for follow-up with their primary care physician.  She has been hypertensive in department.  States she is out of her current blood pressure medicines.  We will give her dose of her home blood pressure medicines in department.  She is asymptomatic without headache, vision changes, emesis, chest pain, shortness of breath, abdominal pain.  Will DC home with refill of her blood pressure medications as patient states she does not have follow-up appoint with her PCP.  Hemodynamically stable and appropriate for DC home at this time.  I discussed return precautions.  Patient voiced understanding is agreeable to follow-up.  Patient discussed with my attending physician who agrees with the treatment, plan and disposition.  Patient was systolic blood pressure in 196 at discharge.  Nursing had not notified me of this at dc.  We had given her her home blood pressure medications and her visit today.  She was asymptomatic without headache, vision changes, chest pain, shortness of breath, emesis.  I had previously discussed with patient that she would need to follow-up with her PCP for reevaluation of her blood pressure.  I did discharge her with her home blood pressure medication prescription as she has been out at home over the last week.     Final Clinical Impressions(s) / ED Diagnoses   Final diagnoses:  Acute cystitis without hematuria  Medication refill    ED Discharge Orders         Ordered    sulfamethoxazole-trimethoprim (BACTRIM DS,SEPTRA DS) 800-160 MG tablet  2 times daily     12/28/18 1401    ondansetron (ZOFRAN ODT) 4 MG disintegrating tablet  Every 8 hours PRN     12/28/18 1412    losartan (COZAAR) 50 MG tablet  Daily     12/28/18 1415           Lalaine Overstreet A, PA-C 12/28/18 1554    Charlesetta Shanks, MD 12/29/18 [phone]

## 2018-12-28 NOTE — ED Notes (Signed)
Bed: WA13 Expected date:  Expected time:  Means of arrival:  Comments: EMS 

## 2018-12-28 NOTE — ED Triage Notes (Signed)
Patient states she had an appointment with [facility] at 11:15, she was notified to come to the emergency department for treatment since their is not enough staff to care for her during her appointment. Patient reports she is experiencing productive cough, fever, sore throat, and loss of appetite. Symptoms started last week. The highest recording of fever is 102.69F. Reports not taking anything for her symptoms for the last 2 days.

## 2018-12-28 NOTE — ED Notes (Signed)
Bed: WA07 Expected date:  Expected time:  Means of arrival:  Comments: 

## 2019-01-01 LAB — URINE CULTURE
Culture: >=100000 — AB
Special Requests: NORMAL

## 2019-01-02 ENCOUNTER — Telehealth: Payer: Self-pay

## 2019-01-02 LAB — CULTURE, BLOOD (ROUTINE X 2)
Culture: NO GROWTH
Culture: NO GROWTH
Special Requests: ADEQUATE

## 2019-01-02 NOTE — Telephone Encounter (Signed)
Post ED Visit - Positive Culture Follow-up  Culture report reviewed by antimicrobial stewardship pharmacist: [facility] Team []  Elenor Quinones, Pharm.D. []  Heide Guile, Pharm.D., BCPS AQ-ID []  Parks Neptune, Pharm.D., BCPS []  Alycia Rossetti, Pharm.D., BCPS []  [place], Pharm.D., BCPS, AAHIVP []  Legrand Como, Pharm.D., BCPS, AAHIVP []  Salome Arnt, PharmD, BCPS []  Johnnette Gourd, PharmD, BCPS []  Hughes Better, PharmD, BCPS []  Leeroy Cha, PharmD []  Laqueta Linden, PharmD, BCPS []  Albertina Parr, PharmD  [facility] Team []  Leodis Sias, PharmD []  Lindell Spar, PharmD []  Royetta Asal, PharmD []  Graylin Shiver, Rph []  Rema Fendt) Glennon Mac, PharmD []  Arlyn Dunning, PharmD []  Netta Cedars, PharmD [x]  Dia Sitter, PharmD []  Leone Haven, PharmD []  Gretta Arab, PharmD []  Theodis Shove, PharmD []  Peggyann Juba, PharmD []  Reuel Boom, PharmD   Positive urine culture Treated with Bactrim DS, organism sensitive to the same and no further patient follow-up is required at this time.  Genia Del 01/02/2019, 8:28 AM

## 2019-10-12 ENCOUNTER — Other Ambulatory Visit: Payer: Self-pay

## 2019-10-12 ENCOUNTER — Encounter (HOSPITAL_COMMUNITY): Payer: Self-pay | Admitting: Emergency Medicine

## 2019-10-12 ENCOUNTER — Emergency Department (HOSPITAL_COMMUNITY)
Admission: EM | Admit: 2019-10-12 | Discharge: 2019-10-13 | Disposition: A | Discharge status: Home / Self Care | Payer: Medicare HMO | Attending: Emergency Medicine | Admitting: Emergency Medicine

## 2019-10-12 DIAGNOSIS — U071 COVID-19: Secondary | ICD-10-CM | POA: Diagnosis not present

## 2019-10-12 DIAGNOSIS — Z79899 Other long term (current) drug therapy: Secondary | ICD-10-CM | POA: Diagnosis not present

## 2019-10-12 DIAGNOSIS — F17210 Nicotine dependence, cigarettes, uncomplicated: Secondary | ICD-10-CM | POA: Insufficient documentation

## 2019-10-12 DIAGNOSIS — F17220 Nicotine dependence, chewing tobacco, uncomplicated: Secondary | ICD-10-CM | POA: Insufficient documentation

## 2019-10-12 DIAGNOSIS — I10 Essential (primary) hypertension: Secondary | ICD-10-CM | POA: Insufficient documentation

## 2019-10-12 DIAGNOSIS — R509 Fever, unspecified: Secondary | ICD-10-CM

## 2019-10-12 DIAGNOSIS — R05 Cough: Secondary | ICD-10-CM | POA: Diagnosis present

## 2019-10-12 LAB — COMPREHENSIVE METABOLIC PANEL
ALT: 13 U/L (ref 0–44)
AST: 17 U/L (ref 15–41)
Albumin: 3.2 g/dL — ABNORMAL LOW (ref 3.5–5.0)
Alkaline Phosphatase: 51 U/L (ref 38–126)
Anion gap: 13 (ref 5–15)
BUN: 18 mg/dL (ref 8–23)
CO2: 21 mmol/L — ABNORMAL LOW (ref 22–32)
Calcium: 8.9 mg/dL (ref 8.9–10.3)
Chloride: 102 mmol/L (ref 98–111)
Creatinine, Ser: 1.59 mg/dL — ABNORMAL HIGH (ref 0.44–1.00)
GFR calc Af Amer: 38 mL/min — ABNORMAL LOW (ref >60)
GFR calc non Af Amer: 33 mL/min — ABNORMAL LOW (ref >60)
Glucose, Bld: 140 mg/dL — ABNORMAL HIGH (ref 70–99)
Potassium: 3.7 mmol/L (ref 3.5–5.1)
Sodium: 136 mmol/L (ref 135–145)
Total Bilirubin: 0.6 mg/dL (ref 0.3–1.2)
Total Protein: 6.9 g/dL (ref 6.5–8.1)

## 2019-10-12 LAB — CBC WITH DIFFERENTIAL/PLATELET
Abs Immature Granulocytes: 0.05 10*3/uL (ref 0.00–0.07)
Basophils Absolute: 0 10*3/uL (ref 0.0–0.1)
Basophils Relative: 0 %
Eosinophils Absolute: 0 10*3/uL (ref 0.0–0.5)
Eosinophils Relative: 0 %
HCT: 43.8 % (ref 36.0–46.0)
Hemoglobin: 13.9 g/dL (ref 12.0–15.0)
Immature Granulocytes: 1 %
Lymphocytes Relative: 13 %
Lymphs Abs: 0.8 10*3/uL (ref 0.7–4.0)
MCH: 28.3 pg (ref 26.0–34.0)
MCHC: 31.7 g/dL (ref 30.0–36.0)
MCV: 89.2 fL (ref 80.0–100.0)
Monocytes Absolute: 0.4 10*3/uL (ref 0.1–1.0)
Monocytes Relative: 7 %
Neutro Abs: 5.1 10*3/uL (ref 1.7–7.7)
Neutrophils Relative %: 79 %
Platelets: 257 10*3/uL (ref 150–400)
RBC: 4.91 MIL/uL (ref 3.87–5.11)
RDW: 15 % (ref 11.5–15.5)
WBC: 6.5 10*3/uL (ref 4.0–10.5)
nRBC: 0 % (ref 0.0–0.2)

## 2019-10-12 LAB — LACTIC ACID, PLASMA: Lactic Acid, Venous: 1.9 mmol/L (ref 0.5–1.9)

## 2019-10-12 MED ORDER — SODIUM CHLORIDE 0.9% FLUSH: 3.0000 mL | Freq: Once | INTRAVENOUS | Status: DC

## 2019-10-12 NOTE — ED Triage Notes (Signed)
Pt. Stated, for the last 4 days Ive had no appetite, weak, headache, fever, body aches and pain.

## 2019-10-12 NOTE — ED Notes (Signed)
While tech was checking vitals, a bug was seen crawling on jacket of left arm. Tech showed to charge and was told that she needs to be next one moved. Tech placed a label on the specimen cup that holds the "Bed Bug" and it is in triage area until the pt is moved.

## 2019-10-13 ENCOUNTER — Emergency Department (HOSPITAL_COMMUNITY): Payer: Medicare HMO

## 2019-10-13 LAB — POC SARS CORONAVIRUS 2 AG -  ED: SARS Coronavirus 2 Ag: POSITIVE — AB

## 2019-10-13 MED ORDER — GUAIFENESIN ER 1200 MG PO TB12: 1.0000 | ORAL_TABLET | Freq: Two times a day (BID) | ORAL | 0 refills | Status: DC

## 2019-10-13 MED ORDER — SODIUM CHLORIDE 0.9 % IV BOLUS
500.0000 mL | Freq: Once | INTRAVENOUS | Status: AC
  Administered 2019-10-13: 500 mL via INTRAVENOUS

## 2019-10-13 NOTE — ED Provider Notes (Signed)
[facility] EMERGENCY DEPARTMENT Provider Note   CSN: [id number] Arrival date & time: 10/12/19  1421     History No chief complaint on file.   Kelsey Bridges is a 69 y.o. female.  HPI Patient presents to the emergency department with body aches, headache, cough and fever started 5 days ago.  The patient states that there is people that live in her building that have Covid.  She states that some of the residents do not practice good hygiene.  The patient states that nothing seems to make her condition better or worse.  The patient states she did not take any medications prior to arrival for her symptoms.  The patient denies chest pain, shortness of breath, headache,blurred vision, neck pain, weakness, numbness, dizziness, anorexia, edema, abdominal pain, nausea, vomiting, diarrhea, rash, back pain, dysuria, hematemesis, bloody stool, near syncope, or syncope.    Past Medical History:  Diagnosis Date  . Abnormal Pap smear   . Hammer toe   . Hypertension     Patient Active Problem List   Diagnosis Date Noted  . LSIL (low grade squamous intraepithelial lesion) on Pap smear 06/08/2011    Past Surgical History:  Procedure Laterality Date  . ABDOMINAL HYSTERECTOMY    . LAPAROSCOPIC SUPRACERVICAL HYSTERECTOMY    . left ovary  1988  . Varicose veins       OB History    Gravida  2   Para  1   Term  1   Preterm      AB  1   Living  1     SAB  1   TAB      Ectopic      Multiple      Live Births              Family History  Problem Relation Age of Onset  . Cancer Brother        liver  . Cancer Sister        breast    Social History   Tobacco Use  . Smoking status: Current Every Day Smoker    Packs/day: 0.20    Types: Cigarettes  . Smokeless tobacco: Current User    Types: Snuff  Substance Use Topics  . Alcohol use: Yes    Comment: "at the holidays maybe"  . Drug use: No    Home Medications Prior to Admission  medications   Medication Sig Start Date End Date Taking? Authorizing Provider  losartan (COZAAR) 50 MG tablet Take 1 tablet (50 mg total) by mouth daily. 12/28/18   Henderly, Britni A, PA-C  ondansetron (ZOFRAN ODT) 4 MG disintegrating tablet Take 1 tablet (4 mg total) by mouth every 8 (eight) hours as needed for nausea or vomiting. 12/28/18   Henderly, Britni A, PA-C    Allergies    Penicillins  Review of Systems   Review of Systems All other systems negative except as documented in the HPI. All pertinent positives and negatives as reviewed in the HPI. Physical Exam Updated Vital Signs BP 134/87   Pulse 81   Temp 99.8 F (37.7 C) (Oral)   Resp (!) 29   SpO2 96%   Physical Exam Vitals and nursing note reviewed.  Constitutional:      General: She is not in acute distress.    Appearance: She is well-developed.  HENT:     Head: Normocephalic and atraumatic.  Eyes:     Pupils: Pupils are equal, round, and reactive  to light.  Cardiovascular:     Rate and Rhythm: Normal rate and regular rhythm.     Heart sounds: Normal heart sounds. No murmur. No friction rub. No gallop.   Pulmonary:     Effort: Pulmonary effort is normal. No respiratory distress.     Breath sounds: Normal breath sounds. No wheezing.  Abdominal:     General: Bowel sounds are normal. There is no distension.     Palpations: Abdomen is soft.     Tenderness: There is no abdominal tenderness.  Musculoskeletal:     Cervical back: Normal range of motion and neck supple.  Skin:    General: Skin is warm and dry.     Capillary Refill: Capillary refill takes less than 2 seconds.     Findings: No erythema or rash.  Neurological:     Mental Status: She is alert and oriented to person, place, and time.     Motor: No abnormal muscle tone.     Coordination: Coordination normal.  Psychiatric:        Behavior: Behavior normal.     ED Results / Procedures / Treatments   Labs (all labs ordered are listed, but only  abnormal results are displayed) Labs Reviewed  COMPREHENSIVE METABOLIC PANEL - Abnormal; Notable for the following components:      Result Value   CO2 21 (*)    Glucose, Bld 140 (*)    Creatinine, Ser 1.59 (*)    Albumin 3.2 (*)    GFR calc non Af Amer 33 (*)    GFR calc Af Amer 38 (*)    All other components within normal limits  POC SARS CORONAVIRUS 2 AG -  ED - Abnormal; Notable for the following components:   SARS Coronavirus 2 Ag POSITIVE (*)    All other components within normal limits  LACTIC ACID, PLASMA  CBC WITH DIFFERENTIAL/PLATELET  LACTIC ACID, PLASMA  URINALYSIS, ROUTINE W REFLEX MICROSCOPIC    EKG None  Radiology DG Chest Port 1 View  Result Date: 10/13/2019 CLINICAL DATA:  Fever. EXAM: PORTABLE CHEST 1 VIEW COMPARISON:  Radiograph 12/28/2018 FINDINGS: Upper normal heart size. Unchanged aortic tortuosity. Patchy airspace disease in the left greater than right lung base. No pulmonary edema. No pleural fluid. No pneumothorax. No acute osseous abnormalities are seen. IMPRESSION: Patchy airspace disease in the left greater than right lung base, suspicious for pneumonia. Electronically Signed   By: Keith Rake M.D.   On: 10/13/2019 02:41    Procedures Procedures (including critical care time)  Medications Ordered in ED Medications  sodium chloride flush (NS) 0.9 % injection 3 mL (has no administration in time range)  sodium chloride 0.9 % bolus 500 mL (500 mLs Intravenous New Bag/Given 10/13/19 0232)    ED Course  I have reviewed the triage vital signs and the nursing notes.  Pertinent labs & imaging results that were available during my care of the patient were reviewed by me and considered in my medical decision making (see chart for details).    MDM Rules/Calculators/A&P                     Patient has been stable here in the emergency department thus far has not shown any signs of respiratory distress.  The patient is sitting comfortably in her bed.  I  did advise her of the positive Covid test and that she will need to closely monitor her symptoms and return here for any worsening in her  condition.  The patient is also advised that she will need to quarantine over the next 10 days.   RETHIA KEY was evaluated in Emergency Department on 10/13/2019 for the symptoms described in the history of present illness. She was evaluated in the context of the global COVID-19 pandemic, which necessitated consideration that the patient might be at risk for infection with the SARS-CoV-2 virus that causes COVID-19. Institutional protocols and algorithms that pertain to the evaluation of patients at risk for COVID-19 are in a state of rapid change based on information released by regulatory bodies including the CDC and federal and state organizations. These policies and algorithms were followed during the patient's care in the ED.  Final Clinical Impression(s) / ED Diagnoses Final diagnoses:  Fever    Rx / DC Orders ED Discharge Orders    None       Dalia Heading, PA-C 10/13/19 [id number]    Orpah Greek, MD 10/13/19 [phone]

## 2019-10-13 NOTE — ED Notes (Signed)
Patient verbalizes understanding of discharge instructions. Opportunity for questioning and answers were provided. Armband removed by staff, pt discharged from ED ambulatory.   

## 2019-10-13 NOTE — ED Notes (Signed)
Dr Betsey Holiday and RN Madaline Guthrie informed of Covid pos

## 2019-10-13 NOTE — Discharge Instructions (Addendum)
Tylenol 1000 mg every 6 hours for fever and pain.  Increase your fluid intake and rest as much as possible.

## 2019-10-13 NOTE — ED Notes (Signed)
Ambulated patient in room with pulse ox on. Patients o2 remained around 94-95% while ambulating and 96 % while in bed

## 2019-11-22 DIAGNOSIS — M25561 Pain in right knee: Secondary | ICD-10-CM | POA: Insufficient documentation

## 2020-01-24 ENCOUNTER — Other Ambulatory Visit: Payer: Self-pay | Admitting: Family Medicine

## 2020-01-24 DIAGNOSIS — Z1231 Encounter for screening mammogram for malignant neoplasm of breast: Secondary | ICD-10-CM

## 2020-02-14 ENCOUNTER — Other Ambulatory Visit: Payer: Self-pay

## 2020-02-14 ENCOUNTER — Ambulatory Visit
Admission: RE | Admit: 2020-02-14 | Discharge: 2020-02-14 | Disposition: A | Discharge status: Home / Self Care | Payer: Medicare HMO | Source: Ambulatory Visit | Attending: Family Medicine | Admitting: Family Medicine

## 2020-02-14 DIAGNOSIS — Z1231 Encounter for screening mammogram for malignant neoplasm of breast: Secondary | ICD-10-CM

## 2020-05-15 ENCOUNTER — Other Ambulatory Visit: Payer: Self-pay

## 2020-05-15 ENCOUNTER — Encounter: Payer: Self-pay | Admitting: Cardiovascular Disease

## 2020-05-15 ENCOUNTER — Ambulatory Visit (INDEPENDENT_AMBULATORY_CARE_PROVIDER_SITE_OTHER): Payer: Medicare HMO | Admitting: Cardiovascular Disease

## 2020-05-15 ENCOUNTER — Telehealth: Payer: Self-pay | Admitting: Radiology

## 2020-05-15 VITALS — BP 140/76 | HR 73 | Ht 61.0 in | Wt 153.0 lb

## 2020-05-15 DIAGNOSIS — E782 Mixed hyperlipidemia: Secondary | ICD-10-CM

## 2020-05-15 DIAGNOSIS — Z720 Tobacco use: Secondary | ICD-10-CM | POA: Diagnosis not present

## 2020-05-15 DIAGNOSIS — E785 Hyperlipidemia, unspecified: Secondary | ICD-10-CM | POA: Insufficient documentation

## 2020-05-15 DIAGNOSIS — I10 Essential (primary) hypertension: Secondary | ICD-10-CM | POA: Diagnosis not present

## 2020-05-15 DIAGNOSIS — I482 Chronic atrial fibrillation, unspecified: Secondary | ICD-10-CM | POA: Insufficient documentation

## 2020-05-15 DIAGNOSIS — I480 Paroxysmal atrial fibrillation: Secondary | ICD-10-CM

## 2020-05-15 MED ORDER — DILTIAZEM HCL ER COATED BEADS 120 MG PO CP24: 120.0000 mg | ORAL_CAPSULE | Freq: Every day | ORAL | 3 refills | Status: DC

## 2020-05-15 NOTE — Patient Instructions (Addendum)
Medication Instructions:  START diltiazem 120mg  daily Continue all other current medications  *If you need a refill on your cardiac medications before your next appointment, please call your pharmacy*   Testing/Procedures: Echocardiogram @ [address] Term Monitor Instructions   Your physician has requested you wear your ZIO patch monitor for 14 days.   This is a single patch monitor.  [employer] supplies one patch monitor per enrollment.  Additional stickers are not available.   Please do not apply patch if you will be having a Nuclear Stress Test, Echocardiogram, Cardiac CT, MRI, or Chest Xray during the time frame you would be wearing the monitor. The patch cannot be worn during these tests.  You cannot remove and re-apply the ZIO XT patch monitor.   Your ZIO patch monitor will be sent USPS Priority mail from [facility] directly to your home address. The monitor may also be mailed to a PO BOX if home delivery is not available.   It may take 3-5 days to receive your monitor after you have been enrolled.   Once you have received you monitor, please review enclosed instructions.  Your monitor has already been registered assigning a specific monitor serial # to you.   Applying the monitor   Shave hair from upper left chest.   Hold abrader disc by orange tab.  Rub abrader in 40 strokes over left upper chest as indicated in your monitor instructions.   Clean area with 4 enclosed alcohol pads .  Use all pads to assure are is cleaned thoroughly.  Let dry.   Apply patch as indicated in monitor instructions.  Patch will be place under collarbone on left side of chest with arrow pointing upward.   Rub patch adhesive wings for 2 minutes.Remove white label marked "1".  Remove white label marked "2".  Rub patch adhesive wings for 2 additional minutes.   While looking in a mirror, press and release button in center of patch.  A small green light will  flash 3-4 times .  This will be your only indicator the monitor has been turned on.     Do not shower for the first 24 hours.  You may shower after the first 24 hours.   Press button if you feel a symptom. You will hear a small click.  Record Date, Time and Symptom in the Patient Log Book.   When you are ready to remove patch, follow instructions on last 2 pages of Patient Log Book.  Stick patch monitor onto last page of Patient Log Book.   Place Patient Log Book in [place] box.  Use locking tab on box and tape box closed securely.  The [employer] has [employer] on it.  Please place in mailbox as soon as possible.  Your physician should have your test results approximately 7 days after the monitor has been mailed back to [facility].   Call [place] at [phone] if you have questions regarding your ZIO XT patch monitor.  Call them immediately if you see an orange light blinking on your monitor.   If your monitor falls off in less than 4 days contact our Monitor department at [phone].  If your monitor becomes loose or falls off after 4 days call [employer] at [phone] for suggestions on securing your monitor.     Follow-Up: At [facility], you and your health needs are our priority.  As part of our  continuing mission to provide you with exceptional heart care, we have created designated Provider Care Teams.  These Care Teams include your primary Cardiologist (physician) and Advanced Practice Providers (APPs -  Physician Assistants and Nurse Practitioners) who all work together to provide you with the care you need, when you need it.  We recommend signing up for the patient portal called "MyChart".  Sign up information is provided on this After Visit Summary.  MyChart is used to connect with patients for Virtual Visits (Telemedicine).  Patients are able to view lab/test results, encounter notes, upcoming appointments, etc.  Non-urgent messages can be  sent to your provider as well.   To learn more about what you can do with MyChart, go to [email or web address].    Your next appointment:   4 weeks with clinical pharmacist  2 months with Dr. Gwenlyn Found    Other Instructions Dr. Gwenlyn Found has requested that you schedule an appointment with one of our clinical pharmacists for a blood pressure check appointment in 4 weeks Please monitor your blood pressure (BP) at home and please bring your BP cuff and your BP readings with you to this appointment  HOW TO TAKE YOUR BLOOD PRESSURE:  Rest 5 minutes before taking your blood pressure.  Don't smoke or drink caffeinated beverages for at least 30 minutes before.  Take your blood pressure before (not after) you eat.  Sit comfortably with your back supported and both feet on the floor (don't cross your legs).  Elevate your arm to heart level on a table or a desk.  Use the proper sized cuff. It should fit smoothly and snugly around your bare upper arm. There should be enough room to slip a fingertip under the cuff. The bottom edge of the cuff should be 1 inch above the crease of the elbow.  Ideally, take 3 measurements at one sitting and record the average.    Atrial Fibrillation  Atrial fibrillation is a type of irregular or rapid heartbeat (arrhythmia). In atrial fibrillation, the top part of the heart (atria) beats in an irregular pattern. This makes the heart unable to pump blood normally and effectively. The goal of treatment is to prevent blood clots from forming, control your heart rate, or restore your heartbeat to a normal rhythm. If this condition is not treated, it can cause serious problems, such as a weakened heart muscle (cardiomyopathy) or a stroke. What are the causes? This condition is often caused by medical conditions that damage the heart's electrical system. These include:  High blood pressure (hypertension). This is the most common cause.  Certain heart problems or  conditions, such as heart failure, coronary artery disease, heart valve problems, or heart surgery.  Diabetes.  Overactive thyroid (hyperthyroidism).  Obesity.  Chronic kidney disease. In some cases, the cause of this condition is not known. What increases the risk? This condition is more likely to develop in:  Older people.  People who smoke.  Athletes who do endurance exercise.  People who have a family history of atrial fibrillation.  Men.  People who use drugs.  People who drink a lot of alcohol.  People who have lung conditions, such as emphysema, pneumonia, or COPD.  People who have obstructive sleep apnea. What are the signs or symptoms? Symptoms of this condition include:  A feeling that your heart is racing or beating irregularly.  Discomfort or pain in your chest.  Shortness of breath.  Sudden light-headedness or weakness.  Tiring easily during exercise or activity.  Fatigue.  Syncope (fainting).  Sweating. In some cases, there are no symptoms. How is this diagnosed? Your health care provider may detect atrial fibrillation when taking your pulse. If detected, this condition may be diagnosed with:  An electrocardiogram (ECG) to check electrical signals of the heart.  An ambulatory cardiac monitor to record your heart's activity for a few days.  A transthoracic echocardiogram (TTE) to create pictures of your heart.  A transesophageal echocardiogram (TEE) to create even closer pictures of your heart.  A stress test to check your blood supply while you exercise.  Imaging tests, such as a CT scan or chest X-ray.  Blood tests. How is this treated? Treatment depends on underlying conditions and how you feel when you experience atrial fibrillation. This condition may be treated with:  Medicines to prevent blood clots or to treat heart rate or heart rhythm problems.  Electrical cardioversion to reset the heart's rhythm.  A pacemaker to correct  abnormal heart rhythm.  Ablation to remove the heart tissue that sends abnormal signals.  Left atrial appendage closure to seal the area where blood clots can form. In some cases, underlying conditions will be treated. Follow these instructions at home: Medicines  Take over-the counter and prescription medicines only as told by your health care provider.  Do not take any new medicines without talking to your health care provider.  If you are taking blood thinners: ? Talk with your health care provider before you take any medicines that contain aspirin or NSAIDs, such as ibuprofen. These medicines increase your risk for dangerous bleeding. ? Take your medicine exactly as told, at the same time every day. ? Avoid activities that could cause injury or bruising, and follow instructions about how to prevent falls. ? Wear a medical alert bracelet or carry a card that lists what medicines you take. Lifestyle  Do not use any products that contain nicotine or tobacco, such as cigarettes, e-cigarettes, and chewing tobacco. If you need help quitting, ask your health care provider.  Eat heart-healthy foods. Talk with a dietitian to make an eating plan that is right for you.  Exercise regularly as told by your health care provider.  Do not drink alcohol.  Lose weight if you are overweight.  Do not use drugs, including cannabis. General instructions  If you have obstructive sleep apnea, manage your condition as told by your health care provider.  Do not use diet pills unless your health care provider approves. Diet pills can make heart problems worse.  Keep all follow-up visits as told by your health care provider. This is important. Contact a health care provider if you:  Notice a change in the rate, rhythm, or strength of your heartbeat.  Are taking a blood thinner and you notice more bruising.  Tire more easily when you exercise or do heavy work.  Have a sudden change in  weight. Get help right away if you have:   Chest pain, abdominal pain, sweating, or weakness.  Trouble breathing.  Side effects of blood thinners, such as blood in your vomit, stool, or urine, or bleeding that cannot stop.  Any symptoms of a stroke. "BE FAST" is an easy way to remember the main warning signs of a stroke: ? B - Balance. Signs are dizziness, sudden trouble walking, or loss of balance. ? E - Eyes. Signs are trouble seeing or a sudden change in vision. ? F - Face. Signs are sudden weakness or numbness of the face, or the face  or eyelid drooping on one side. ? A - Arms. Signs are weakness or numbness in an arm. This happens suddenly and usually on one side of the body. ? S - Speech. Signs are sudden trouble speaking, slurred speech, or trouble understanding what people say. ? T - Time. Time to call emergency services. Write down what time symptoms started.  Other signs of a stroke, such as: ? A sudden, severe headache with no known cause. ? Nausea or vomiting. ? Seizure. These symptoms may represent a serious problem that is an emergency. Do not wait to see if the symptoms will go away. Get medical help right away. Call your local emergency services (911 in the [place] Do not drive yourself to the hospital. Summary  Atrial fibrillation is a type of irregular or rapid heartbeat (arrhythmia).  Symptoms include a feeling that your heart is beating fast or irregularly.  You may be given medicines to prevent blood clots or to treat heart rate or heart rhythm problems.  Get help right away if you have signs or symptoms of a stroke.  Get help right away if you cannot catch your breath or have chest pain or pressure. This information is not intended to replace advice given to you by your health care provider. Make sure you discuss any questions you have with your health care provider. Document Revised: 03/22/2019 Document Reviewed: 03/22/2019 Elsevier Patient Education  [place].

## 2020-05-15 NOTE — Progress Notes (Signed)
b

## 2020-05-15 NOTE — Assessment & Plan Note (Signed)
Recent twelve-lead electrocardiogram performed by her PCP revealed A. fib with a ventricular response of 100.  The CHA2DSVASC2 score is   3.  She was started on Eliquis oral anticoagulation for stroke prophylaxis.  She is on high-dose ibuprofen for right knee pain which I told her to discontinue while being on DOAC.Marland Kitchen

## 2020-05-15 NOTE — Progress Notes (Signed)
05/15/2020 Garth Schlatter   1951-09-07  [id number]  Primary Physician Medicine, [facility] Cardiologist: Lorretta Harp MD Garret Reddish, [place], [place]  HPI:  Kelsey Bridges is a 69 y.o. 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  She was referred by Dr. Lavonia Drafts for cardiovascular evaluation because of recently recognized PAF.  She does have a history of treated hypertension.  There is no family history for heart disease.  She is never had a heart doctor stroke.  She denies chest pain or shortness of breath.  She really did not know she had atrial fibrillation.  She was started on on Eliquis oral anticoagulation.   Current Meds  Medication Sig  . losartan (COZAAR) 50 MG tablet Take 1 tablet (50 mg total) by mouth daily.  . ondansetron (ZOFRAN ODT) 4 MG disintegrating tablet Take 1 tablet (4 mg total) by mouth every 8 (eight) hours as needed for nausea or vomiting.  . [DISCONTINUED] Guaifenesin 1200 MG TB12 Take 1 tablet (1,200 mg total) by mouth 2 (two) times daily.     Allergies  Allergen Reactions  . Penicillins     Did it involve swelling of the face/tongue/throat, SOB, or low BP? No Did it involve sudden or severe rash/hives, skin peeling, or any reaction on the inside of your mouth or nose? Yes Did you need to seek medical attention at a hospital or doctor's office? No When did it last happen?More than 10 If all above answers are "NO", may proceed with cephalosporin use.  Pt states "she can take the tablet but not the injection"    Social History   Socioeconomic History  . Marital status: Widowed    Spouse name: Not on file  . Number of children: Not on file  . Years of education: Not on file  . Highest education level: Not on file  Occupational History  . Not on file  Tobacco Use  . Smoking status: Current Every Day Smoker     Packs/day: 0.20    Types: Cigarettes  . Smokeless tobacco: Current User    Types: Snuff  Vaping Use  . Vaping Use: Never used  Substance and Sexual Activity  . Alcohol use: Yes    Comment: "at the holidays maybe"  . Drug use: No  . Sexual activity: Not Currently    Birth control/protection: Surgical  Other Topics Concern  . Not on file  Social History Narrative  . Not on file   Social Determinants of Health   Financial Resource Strain:   . Difficulty of Paying Living Expenses:   Food Insecurity:   . Worried About Charity fundraiser in the Last Year:   . Arboriculturist in the Last Year:   Transportation Needs:   . Film/video editor (Medical):   Marland Kitchen Lack of Transportation (Non-Medical):   Physical Activity:   . Days of Exercise per Week:   . Minutes of Exercise per Session:   Stress:   . Feeling of Stress :   Social Connections:   . Frequency of Communication with Friends and Family:   . Frequency of Social Gatherings with Friends and Family:   . Attends Religious Services:   . Active Member of Clubs or Organizations:   . Attends Archivist Meetings:   Marland Kitchen Marital Status:   Intimate Partner Violence:   .  Fear of Current or Ex-Partner:   . Emotionally Abused:   Marland Kitchen Physically Abused:   . Sexually Abused:      Review of Systems: General: negative for chills, fever, night sweats or weight changes.  Cardiovascular: negative for chest pain, dyspnea on exertion, edema, orthopnea, palpitations, paroxysmal nocturnal dyspnea or shortness of breath Dermatological: negative for rash Respiratory: negative for cough or wheezing Urologic: negative for hematuria Abdominal: negative for nausea, vomiting, diarrhea, bright red blood per rectum, melena, or hematemesis Neurologic: negative for visual changes, syncope, or dizziness All other systems reviewed and are otherwise negative except as noted above.    Blood pressure 140/76, pulse 73, height 5\' 1"  (1.549 m),  weight 153 lb (69.4 kg), SpO2 99 %.  General appearance: alert and no distress Neck: no adenopathy, no carotid bruit, no JVD, supple, symmetrical, trachea midline and thyroid not enlarged, symmetric, no tenderness/mass/nodules Lungs: clear to auscultation bilaterally Heart: regular rate and rhythm, S1, S2 normal, no murmur, click, rub or gallop Extremities: extremities normal, atraumatic, no cyanosis or edema Pulses: 2+ and symmetric Skin: Skin color, texture, turgor normal. No rashes or lesions Neurologic: Alert and oriented X 3, normal strength and tone. Normal symmetric reflexes. Normal coordination and gait  EKG sinus rhythm at 73 without ST or T wave changes.  I personally reviewed this EKG.  ASSESSMENT AND PLAN:   Essential hypertension History of essential hypertension on losartan 50 mg a day.  She does keep a blood pressure log at home which show some high values.  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  I am not add Cardizem CD 120 to her current medications.  I am going to have her keep a 30-day blood pressure log and have her see a Pharm.D. back in 4 weeks to review make further recommendations.  Paroxysmal atrial fibrillation (HCC) Recent twelve-lead electrocardiogram performed by her PCP revealed A. fib with a ventricular response of 100.  The CHA2DSVASC2 score is   3.  She was started on Eliquis oral anticoagulation for stroke prophylaxis.  She is on high-dose ibuprofen for right knee pain which I told her to discontinue while being on DOAC.Marland Kitchen  Hyperlipidemia Recent blood work performed by her PCP revealed total cholesterol of 203, LDL 120 and HDL 70.  Tobacco abuse We talked about the importance of tobacco cessation.      Lorretta Harp MD FACP,FACC,FAHA, [facility] 05/15/2020 2:45 PM

## 2020-05-15 NOTE — Assessment & Plan Note (Signed)
Recent blood work performed by her PCP revealed total cholesterol of 203, LDL 120 and HDL 70.

## 2020-05-15 NOTE — Assessment & Plan Note (Signed)
We talked about the importance of tobacco cessation.

## 2020-05-15 NOTE — Assessment & Plan Note (Signed)
History of essential hypertension on losartan 50 mg a day.  She does keep a blood pressure log at home which show some high values.  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  I am not add Cardizem CD 120 to her current medications.  I am going to have her keep a 30-day blood pressure log and have her see a Pharm.D. back in 4 weeks to review make further recommendations.

## 2020-05-15 NOTE — Telephone Encounter (Signed)
Enrolled patient for a 14 day Zio monitor to be mailed to patients home.  

## 2020-05-20 ENCOUNTER — Other Ambulatory Visit (INDEPENDENT_AMBULATORY_CARE_PROVIDER_SITE_OTHER): Payer: Medicare HMO

## 2020-05-20 DIAGNOSIS — I480 Paroxysmal atrial fibrillation: Secondary | ICD-10-CM

## 2020-06-12 ENCOUNTER — Ambulatory Visit: Payer: Medicare HMO

## 2020-06-12 ENCOUNTER — Other Ambulatory Visit (HOSPITAL_COMMUNITY): Payer: Medicare HMO

## 2020-06-21 ENCOUNTER — Telehealth: Payer: Self-pay | Admitting: Cardiovascular Disease

## 2020-06-21 ENCOUNTER — Other Ambulatory Visit: Payer: Self-pay

## 2020-06-21 NOTE — Telephone Encounter (Signed)
Left message to call office

## 2020-06-21 NOTE — Telephone Encounter (Signed)
New message:     Patient returning call back  From the nurse. I did not see a note.

## 2020-06-27 NOTE — Progress Notes (Signed)
Patient ID: Kelsey Bridges                 DOB: 12-27-1950                      MRN: [id number]     HPI: Kelsey Bridges is a 69 y.o. female referred by Dr. Gwenlyn Found to HTN clinic. PMH is significant for HTN, HLD, and Afib.  Patient saw Dr. Gwenlyn Found on 05/15/2020 for Afib. At that time, patient was already on losartan 50 mg daily. However, blood pressure was elevated at 140/76 and Dr. Gwenlyn Found added Cardizem CD 120 mg daily. Patient was instructed to keep home BP log and bring to next visit.  Patient presents to HTN clinic today for follow up. Patient reports that her blood pressure has not improved much since starting the diltiazem. She did not bring her home readings today but she reports her systolic pressure is usually in 170s sometimes 389H, and diastolic usually around 734-287. Patient usually checks BP at around noon. Patient denies dizziness and lightheadedness. However, patient reports frequent palpitations that come and go.  Patient's diet includes a lot of processed meats high in sodium, such as hot dogs/hamburgers and deli meats, as well as fried foods and carbs. Patient enjoys all kinds of vegetables and reports using Mrs. Dash seasoning. Patient also enjoys drinking 2 cups of coffee every morning.  Patient gets exercise by walking 3 blocks to the store and back home. She also passes out flyers on Mondays and helps the [employer] break down boxes several times per week. Patient reports needing physical therapy and cortisone injection in right knee due to pain.  Patient frequently feels stressed about her friend who lives in her building. She reports her friend has had 3 strokes and requires a tube feed. Patient helps take care of him and ensures that he takes his medicine. Patient is very worried about him and she reports smoking to help relieve her stress.  Office BP today 164/86 and HR 73.   Current HTN meds: Losartan 50 mg once daily. Diltiazem 120 mg once daily. Previously  tried: Lisinopril 20 mg BP goal: <130/80  Family History:  Family History  Problem Relation Age of Onset  . Cancer Brother        liver  . Cancer Sister        breast     Social History: Current smoker. Retired Quarry manager.  Diet: Drinks 2 cups of coffee every morning; not too many sweets or sodas -Breakfast: grits, pudding, bacon, scrambled eggs with cheese; eats at least 3 times per week -Lunch: hot dog with everything on it, tossed salad, fried bolognea, hamburger, loves vegetables (cabbage, spinach, mustard greens, collard greens, broccoli, cauliflower, squash, turnips) -Dinner: ordered out yesterday fish, french fries, subs; T bone steak, mac and cheese, meat loaf, potatoes, spaghetti  Exercise: Walks to the store and back. Helps pass out flyers and break down boxes at [employer].  Home BP readings: 170s-180s/100s-110s  Wt Readings from Last 3 Encounters:  05/15/20 153 lb (69.4 kg)  12/28/18 150 lb (68 kg)  12/01/15 145 lb (65.8 kg)   BP Readings from Last 3 Encounters:  05/15/20 140/76  10/13/19 121/77  12/28/18 (!) 196/99   Pulse Readings from Last 3 Encounters:  05/15/20 73  10/13/19 81  12/28/18 71    Renal function: CrCl cannot be calculated (Patient's most recent lab result is older than the maximum 21 days allowed.).  Past  Medical History:  Diagnosis Date  . Abnormal Pap smear   . Hammer toe   . Hypertension     Current Outpatient Medications on File Prior to Visit  Medication Sig Dispense Refill  . diltiazem (CARDIZEM CD) 120 MG 24 hr capsule Take 1 capsule (120 mg total) by mouth daily. 90 capsule 3  . ELIQUIS 5 MG TABS tablet     . ibuprofen (ADVIL) 800 MG tablet     . losartan (COZAAR) 50 MG tablet Take 1 tablet (50 mg total) by mouth daily. 30 tablet 1  . ondansetron (ZOFRAN ODT) 4 MG disintegrating tablet Take 1 tablet (4 mg total) by mouth every 8 (eight) hours as needed for nausea or vomiting. 20 tablet 0  . sertraline (ZOLOFT) 100 MG tablet      . traZODone (DESYREL) 50 MG tablet      No current facility-administered medications on file prior to visit.    Allergies  Allergen Reactions  . Penicillins     Did it involve swelling of the face/tongue/throat, SOB, or low BP? No Did it involve sudden or severe rash/hives, skin peeling, or any reaction on the inside of your mouth or nose? Yes Did you need to seek medical attention at a hospital or doctor's office? No When did it last happen?More than 10 If all above answers are "NO", may proceed with cephalosporin use.  Pt states "she can take the tablet but not the injection"    There were no vitals taken for this visit.   Assessment/Plan:  1. Hypertension - Patient's BP is above goal of <130/80 both at home and in office. Based on patient's high BP and reported palpitations, we will increase diltiazem to 240 mg daily today. Patient's Zio patch results show several runs of SVT and afib with RVR. We will also check patient's labs today. If renal function and potassium return normal, plan to increase losartan to 100 mg daily.   Patient has several lifestyle factors that could be contributing to her hypertension. Her diet contains plenty of sodium, she consistently feels stressed/worried, and she continues to smoke. We discussed with patient appropriate lifestyle interventions that will help with her blood pressure, including low-sodium DASH diet, increased daily physical activity, and decreased smoking.  Will call patient next week to let her know whether we will increase losartan based on her labs and schedule a follow-up visit. Will also discuss with patient the [employer] class.    Thank you  Esmeralda Links (PharmD Candidate 2022)  Ramond Dial, Pharm.D, BCPS, CPP [facility] [place]  [address] [place], [place] [address]  Phone: [phone]; Fax: [phone]

## 2020-06-28 ENCOUNTER — Ambulatory Visit (INDEPENDENT_AMBULATORY_CARE_PROVIDER_SITE_OTHER): Payer: Medicare HMO | Admitting: Pharmacist

## 2020-06-28 ENCOUNTER — Other Ambulatory Visit: Payer: Self-pay

## 2020-06-28 ENCOUNTER — Ambulatory Visit (HOSPITAL_COMMUNITY): Payer: Medicare HMO | Attending: Cardiology

## 2020-06-28 VITALS — BP 164/86 | HR 73

## 2020-06-28 DIAGNOSIS — I480 Paroxysmal atrial fibrillation: Secondary | ICD-10-CM | POA: Diagnosis present

## 2020-06-28 DIAGNOSIS — I10 Essential (primary) hypertension: Secondary | ICD-10-CM

## 2020-06-28 LAB — BASIC METABOLIC PANEL
BUN/Creatinine Ratio: 15 (ref 12–28)
BUN: 12 mg/dL (ref 8–27)
CO2: 24 mmol/L (ref 20–29)
Calcium: 9.2 mg/dL (ref 8.7–10.3)
Chloride: 103 mmol/L (ref 96–106)
Creatinine, Ser: 0.82 mg/dL (ref 0.57–1.00)
GFR calc Af Amer: 84 mL/min/{1.73_m2} (ref >59)
GFR calc non Af Amer: 73 mL/min/{1.73_m2} (ref >59)
Glucose: 106 mg/dL — ABNORMAL HIGH (ref 65–99)
Potassium: 4.5 mmol/L (ref 3.5–5.2)
Sodium: 139 mmol/L (ref 134–144)

## 2020-06-28 LAB — ECHOCARDIOGRAM COMPLETE
Area-P 1/2: 3.5 cm2
S' Lateral: 3.3 cm

## 2020-06-28 MED ORDER — DILTIAZEM HCL ER COATED BEADS 240 MG PO CP24: 240.0000 mg | ORAL_CAPSULE | Freq: Every day | ORAL | 3 refills | Status: DC

## 2020-06-28 NOTE — Telephone Encounter (Signed)
The patient had echo and visit with the pharmacist in the office today. She will be called with results when complete.

## 2020-06-28 NOTE — Patient Instructions (Addendum)
Great to see you today!  Here is what we discussed today:  INCREASE diltiazem to 240 mg daily. This should help reduce the rapid heart beat you have been experiencing and also lower your blood pressure.  DECREASE the amount of salt in your diet. See below for tips on how to eat a heart-healthy diet.  DECREASE the number of cigarettes that you smoke each day.  INCREASE the amount of physical activity that you get. We recommend 20-30 minutes of strenuous exercise daily.  CONTINUE to check your blood pressure and heart rate at home. It is best to rest 5 minutes before checking. Continue to log your daily readings and bring them to your next visit.  We will get your labs drawn today. If everything is normal we will call you to let you know how we want to adjust your other medications and when we want to see you back in clinic.   Please call us if you have any questions or concerns: [phone]     DASH Eating Plan DASH stands for "Dietary Approaches to Stop Hypertension." The DASH eating plan is a healthy eating plan that has been shown to reduce high blood pressure (hypertension). It may also reduce your risk for type 2 diabetes, heart disease, and stroke. The DASH eating plan may also help with weight loss. What are tips for following this plan?  General guidelines  Avoid eating more than 2,300 mg (milligrams) of salt (sodium) a day. If you have hypertension, you may need to reduce your sodium intake to 1,500 mg a day.  Limit alcohol intake to no more than 1 drink a day for nonpregnant women and 2 drinks a day for men. One drink equals 12 oz of beer, 5 oz of wine, or 1 oz of hard liquor.  Work with your health care provider to maintain a healthy body weight or to lose weight. Ask what an ideal weight is for you.  Get at least 30 minutes of exercise that causes your heart to beat faster (aerobic exercise) most days of the week. Activities may include walking, swimming, or  biking.  Work with your health care provider or diet and nutrition specialist (dietitian) to adjust your eating plan to your individual calorie needs. Reading food labels   Check food labels for the amount of sodium per serving. Choose foods with less than 5 percent of the Daily Value of sodium. Generally, foods with less than 300 mg of sodium per serving fit into this eating plan.  To find whole grains, look for the word "whole" as the first word in the ingredient list. Shopping  Buy products labeled as "low-sodium" or "no salt added."  Buy fresh foods. Avoid canned foods and premade or frozen meals. Cooking  Avoid adding salt when cooking. Use salt-free seasonings or herbs instead of table salt or sea salt. Check with your health care provider or pharmacist before using salt substitutes.  Do not fry foods. Cook foods using healthy methods such as baking, boiling, grilling, and broiling instead.  Cook with heart-healthy oils, such as olive, canola, soybean, or sunflower oil. Meal planning  Eat a balanced diet that includes: ? 5 or more servings of fruits and vegetables each day. At each meal, try to fill half of your plate with fruits and vegetables. ? Up to 6-8 servings of whole grains each day. ? Less than 6 oz of lean meat, poultry, or fish each day. A 3-oz serving of meat is about the same size  as a deck of cards. One egg equals 1 oz. ? 2 servings of low-fat dairy each day. ? A serving of nuts, seeds, or beans 5 times each week. ? Heart-healthy fats. Healthy fats called Omega-3 fatty acids are found in foods such as flaxseeds and coldwater fish, like sardines, salmon, and mackerel.  Limit how much you eat of the following: ? Canned or prepackaged foods. ? Food that is high in trans fat, such as fried foods. ? Food that is high in saturated fat, such as fatty meat. ? Sweets, desserts, sugary drinks, and other foods with added sugar. ? Full-fat dairy products.  Do not salt  foods before eating.  Try to eat at least 2 vegetarian meals each week.  Eat more home-cooked food and less restaurant, buffet, and fast food.  When eating at a restaurant, ask that your food be prepared with less salt or no salt, if possible. What foods are recommended? The items listed may not be a complete list. Talk with your dietitian about what dietary choices are best for you. Grains Whole-grain or whole-wheat bread. Whole-grain or whole-wheat pasta. Brown rice. Modena Morrow. Bulgur. Whole-grain and low-sodium cereals. Pita bread. Low-fat, low-sodium crackers. Whole-wheat flour tortillas. Vegetables Fresh or frozen vegetables (raw, steamed, roasted, or grilled). Low-sodium or reduced-sodium tomato and vegetable juice. Low-sodium or reduced-sodium tomato sauce and tomato paste. Low-sodium or reduced-sodium canned vegetables. Fruits All fresh, dried, or frozen fruit. Canned fruit in natural juice (without added sugar). Meat and other protein foods Skinless chicken or [place]. Ground chicken or [place]. Pork with fat trimmed off. Fish and seafood. Egg whites. Dried beans, peas, or lentils. Unsalted nuts, nut butters, and seeds. Unsalted canned beans. Lean cuts of beef with fat trimmed off. Low-sodium, lean deli meat. Dairy Low-fat (1%) or fat-free (skim) milk. Fat-free, low-fat, or reduced-fat cheeses. Nonfat, low-sodium ricotta or cottage cheese. Low-fat or nonfat yogurt. Low-fat, low-sodium cheese. Fats and oils Soft margarine without trans fats. Vegetable oil. Low-fat, reduced-fat, or light mayonnaise and salad dressings (reduced-sodium). Canola, safflower, olive, soybean, and sunflower oils. Avocado. Seasoning and other foods Herbs. Spices. Seasoning mixes without salt. Unsalted popcorn and pretzels. Fat-free sweets. What foods are not recommended? The items listed may not be a complete list. Talk with your dietitian about what dietary choices are best for you. Grains Baked goods  made with fat, such as croissants, muffins, or some breads. Dry pasta or rice meal packs. Vegetables Creamed or fried vegetables. Vegetables in a cheese sauce. Regular canned vegetables (not low-sodium or reduced-sodium). Regular canned tomato sauce and paste (not low-sodium or reduced-sodium). Regular tomato and vegetable juice (not low-sodium or reduced-sodium). Angie Fava. Olives. Fruits Canned fruit in a light or heavy syrup. Fried fruit. Fruit in cream or butter sauce. Meat and other protein foods Fatty cuts of meat. Ribs. Fried meat. Berniece Salines. Sausage. Bologna and other processed lunch meats. Salami. Fatback. Hotdogs. Bratwurst. Salted nuts and seeds. Canned beans with added salt. Canned or smoked fish. Whole eggs or egg yolks. Chicken or [place] with skin. Dairy Whole or 2% milk, cream, and half-and-half. Whole or full-fat cream cheese. Whole-fat or sweetened yogurt. Full-fat cheese. Nondairy creamers. Whipped toppings. Processed cheese and cheese spreads. Fats and oils Butter. Stick margarine. Lard. Shortening. Ghee. Bacon fat. Tropical oils, such as coconut, palm kernel, or palm oil. Seasoning and other foods Salted popcorn and pretzels. Onion salt, garlic salt, seasoned salt, table salt, and sea salt. Worcestershire sauce. Tartar sauce. Barbecue sauce. Teriyaki sauce. Soy sauce, including reduced-sodium. Steak  sauce. Canned and packaged gravies. Fish sauce. Oyster sauce. Cocktail sauce. Horseradish that you find on the shelf. Ketchup. Mustard. Meat flavorings and tenderizers. Bouillon cubes. Hot sauce and Tabasco sauce. Premade or packaged marinades. Premade or packaged taco seasonings. Relishes. Regular salad dressings. Where to find more information:  National Heart, Lung, and [place]: https://wilson-eaton.com/  American Heart Association: www.heart.org Summary  The DASH eating plan is a healthy eating plan that has been shown to reduce high blood pressure (hypertension). It may also reduce  your risk for type 2 diabetes, heart disease, and stroke.  With the DASH eating plan, you should limit salt (sodium) intake to 2,300 mg a day. If you have hypertension, you may need to reduce your sodium intake to 1,500 mg a day.  When on the DASH eating plan, aim to eat more fresh fruits and vegetables, whole grains, lean proteins, low-fat dairy, and heart-healthy fats.  Work with your health care provider or diet and nutrition specialist (dietitian) to adjust your eating plan to your individual calorie needs. This information is not intended to replace advice given to you by your health care provider. Make sure you discuss any questions you have with your health care provider. Document Revised: 09/10/2017 Document Reviewed: 09/21/2016 Elsevier Patient Education  2020 [employer].

## 2020-07-01 ENCOUNTER — Telehealth: Payer: Self-pay | Admitting: Pharmacist

## 2020-07-01 MED ORDER — LOSARTAN POTASSIUM 100 MG PO TABS: 100.0000 mg | ORAL_TABLET | Freq: Every day | ORAL | 3 refills | Status: DC

## 2020-07-01 NOTE — Telephone Encounter (Signed)
Kelsey Bridges (pharmD student) called pt and let her know her labs looked stable. Scr improved from last check. Will increase losartan to 100mg  daily. Continue higher dose of diltiazem 240mg . Follow up in office for BP check and labs in 2 weeks on 10/4. Rx for losartan 100mg  called into [facility].

## 2020-07-09 ENCOUNTER — Other Ambulatory Visit: Payer: Self-pay

## 2020-07-09 DIAGNOSIS — I340 Nonrheumatic mitral (valve) insufficiency: Secondary | ICD-10-CM

## 2020-07-09 DIAGNOSIS — I480 Paroxysmal atrial fibrillation: Secondary | ICD-10-CM

## 2020-07-09 NOTE — Progress Notes (Signed)
Echo

## 2020-07-15 ENCOUNTER — Ambulatory Visit: Payer: Medicare HMO

## 2020-07-18 ENCOUNTER — Ambulatory Visit: Payer: Medicare HMO

## 2020-07-24 ENCOUNTER — Ambulatory Visit: Payer: Medicare HMO | Admitting: Cardiovascular Disease

## 2020-07-31 ENCOUNTER — Ambulatory Visit: Payer: Medicare HMO | Admitting: Cardiovascular Disease

## 2020-10-28 ENCOUNTER — Other Ambulatory Visit: Payer: Self-pay

## 2020-10-28 NOTE — Telephone Encounter (Signed)
[facility] requesting pt's medication diltiazem 240 mg tablet, be resent to their pharmacy. Please address

## 2020-10-29 MED ORDER — DILTIAZEM HCL ER COATED BEADS 240 MG PO CP24: 240.0000 mg | ORAL_CAPSULE | Freq: Every day | ORAL | 3 refills | Status: DC

## 2020-10-30 ENCOUNTER — Ambulatory Visit: Payer: Medicare HMO | Admitting: Cardiovascular Disease

## 2020-11-18 NOTE — Progress Notes (Deleted)
Cardiology Clinic Note   Patient Name: Kelsey Bridges Date of Encounter: 11/18/2020  Primary Care Provider:  [facility], [facility] Cardiologist:  Quay Burow, MD  Patient Profile    Kelsey Bridges 70 year old female presents the clinic today for follow-up evaluation of her paroxysmal atrial fibrillation.  NO SHOW***  Past Medical History    Past Medical History:  Diagnosis Date  . Abnormal Pap smear   . Hammer toe   . Hypertension    Past Surgical History:  Procedure Laterality Date  . ABDOMINAL HYSTERECTOMY    . LAPAROSCOPIC SUPRACERVICAL HYSTERECTOMY    . left ovary  1988  . Varicose veins      Allergies  Allergies  Allergen Reactions  . Penicillins     Did it involve swelling of the face/tongue/throat, SOB, or low BP? No Did it involve sudden or severe rash/hives, skin peeling, or any reaction on the inside of your mouth or nose? Yes Did you need to seek medical attention at a hospital or doctor's office? No When did it last happen?More than 10 If all above answers are "NO", may proceed with cephalosporin use.  Pt states "she can take the tablet but not the injection"    History of Present Illness    Kelsey Bridges has a PMH of paroxysmal atrial fibrillation and hypertension.  She has no family history of heart disease and has never had a heart attack or stroke.  She is a retired [employer] manager.  She was seen by Dr. Gwenlyn Bridges on 05/15/2020.  During that time she denied chest pain or shortness of breath.  She was not aware of her atrial fibrillation and she was started on Eliquis for anticoagulation.  Her EKG showed sinus rhythm 73 bpm with no ST or T wave deviation.  She presents the clinic today for follow-up evaluation states***  *** denies chest pain, shortness of breath, lower extremity edema, fatigue, palpitations, melena, hematuria, hemoptysis, diaphoresis, weakness, presyncope, syncope, orthopnea, and PND.     Home Medications     Prior to Admission medications   Medication Sig Start Date End Date Taking? Authorizing Provider  diltiazem (CARDIZEM CD) 240 MG 24 hr capsule Take 1 capsule (240 mg total) by mouth daily. 10/29/20   Lorretta Harp, MD  ELIQUIS 5 MG TABS tablet  04/21/20   [provider]  ibuprofen (ADVIL) 800 MG tablet  04/18/20   [provider]  losartan (COZAAR) 100 MG tablet Take 1 tablet (100 mg total) by mouth daily. 07/01/20   Lorretta Harp, MD  ondansetron (ZOFRAN ODT) 4 MG disintegrating tablet Take 1 tablet (4 mg total) by mouth every 8 (eight) hours as needed for nausea or vomiting. 12/28/18   Henderly, Britni A, PA-C  sertraline (ZOLOFT) 100 MG tablet  04/20/20   [provider]  traZODone (DESYREL) 50 MG tablet  04/18/20   [provider]    Family History    Family History  Problem Relation Age of Onset  . Cancer Brother        liver  . Cancer Sister        breast   She indicated that her sister is alive. She indicated that her brother is deceased.  Social History    Social History   Socioeconomic History  . Marital status: Widowed    Spouse name: Not on file  . Number of children: Not on file  . Years of education: Not on file  . Highest  education level: Not on file  Occupational History  . Not on file  Tobacco Use  . Smoking status: Current Every Day Smoker    Packs/day: 0.20    Types: Cigarettes  . Smokeless tobacco: Current User    Types: Snuff  Vaping Use  . Vaping Use: Never used  Substance and Sexual Activity  . Alcohol use: Yes    Comment: "at the holidays maybe"  . Drug use: No  . Sexual activity: Not Currently    Birth control/protection: Surgical  Other Topics Concern  . Not on file  Social History Narrative  . Not on file   Social Determinants of Health   Financial Resource Strain: Not on file  Food Insecurity: Not on file  Transportation Needs: Not on file  Physical Activity: Not on file  Stress: Not on file   Social Connections: Not on file  Intimate Partner Violence: Not on file     Review of Systems    General:  No chills, fever, night sweats or weight changes.  Cardiovascular:  No chest pain, dyspnea on exertion, edema, orthopnea, palpitations, paroxysmal nocturnal dyspnea. Dermatological: No rash, lesions/masses Respiratory: No cough, dyspnea Urologic: No hematuria, dysuria Abdominal:   No nausea, vomiting, diarrhea, bright red blood per rectum, melena, or hematemesis Neurologic:  No visual changes, wkns, changes in mental status. All other systems reviewed and are otherwise negative except as noted above.  Physical Exam    VS:  There were no vitals taken for this visit. , BMI There is no height or weight on file to calculate BMI. GEN: Well nourished, well developed, in no acute distress. HEENT: normal. Neck: Supple, no JVD, carotid bruits, or masses. Cardiac: RRR, no murmurs, rubs, or gallops. No clubbing, cyanosis, edema.  Radials/DP/PT 2+ and equal bilaterally.  Respiratory:  Respirations regular and unlabored, clear to auscultation bilaterally. GI: Soft, nontender, nondistended, BS + x 4. MS: no deformity or atrophy. Skin: warm and dry, no rash. Neuro:  Strength and sensation are intact. Psych: Normal affect.  Accessory Clinical Findings    Recent Labs: 06/28/2020: BUN 12; Creatinine, Ser 0.82; Potassium 4.5; Sodium 139   Recent Lipid Panel    Component Value Date/Time   CHOL 179 07/12/2007 2136   TRIG 82 07/12/2007 2136   HDL 58 07/12/2007 2136   CHOLHDL 3.1 Ratio 07/12/2007 2136   VLDL 16 07/12/2007 2136   LDLCALC 105 (H) 07/12/2007 2136    ECG personally reviewed by me today- *** - No acute changes  Echocardiogram 06/28/2020  IMPRESSIONS    1. Left ventricular ejection fraction, by estimation, is 70 to 75%. The  left ventricle has hyperdynamic function. The left ventricle has no  regional wall motion abnormalities. There is mild left ventricular   hypertrophy of the basal-septal segment. Left  ventricular diastolic function could not be evaluated.  2. Right ventricular systolic function is normal. The right ventricular  size is normal. There is normal pulmonary artery systolic pressure. The  estimated right ventricular systolic pressure is 0000000 mmHg.  3. Left atrial size was severely dilated.  4. The mitral valve is normal in structure. Mild to moderate mitral valve  regurgitation. No evidence of mitral stenosis.  5. The aortic valve is tricuspid. Aortic valve regurgitation is trivial.  No aortic stenosis is present.  6. The inferior vena cava is normal in size with greater than 50%  respiratory variability, suggesting right atrial pressure of 3 mmHg.  7. There is right bowing of the interatrial septum, suggestive  of  elevated left atrial pressure. No atrial level shunt detected by color  flow Doppler.   Assessment & Plan   1.  Paroxysmal atrial fibrillation-EKG today shows***.  Continues to be cardiac unaware.  Echocardiogram showed hyperdynamic EF 70-75%, dilated left atria severe, mitral valve regurgitation mild-moderate, trivial aortic valve regurgitation Continue Eliquis, diltiazem  heart healthy low-sodium diet-salty 6 given Increase physical activity as tolerated Repeat echocardiogram 9/22  Mitral valve regurgitation-no increased DOE or activity intolerance.  Echocardiogram 9/21 showed mild-moderate mitral valve regurgitation Repeat echocardiogram 9/22 Maintain physical activity  Essential hypertension-BP today***.  Well-controlled at home.  Avoid causes for secondary hypertension. Continue losartan, diltiazem Heart healthy low-sodium diet-salty 6 given Increase physical activity as tolerated  Hyperlipidemia-LDL 105 on 07/12/2007 Heart healthy low-sodium high-fiber diet Increase physical activity as tolerated Repeat fasting lipid panel   Tobacco abuse-continues to smoke***.  Tobacco cessation  encouraged. Tobacco cessation information provided  Disposition: Follow-up with Dr. Gwenlyn Bridges in 6 months.  Jossie Ng. Christain Mcraney NP-C    11/18/2020, 12:54 PM [place] [address] Office [phone] Fax [phone]  Notice: This dictation was prepared with Dragon dictation along with smaller phrase technology. Any transcriptional errors that result from this process are unintentional and may not be corrected upon review.  I spent***minutes examining this patient, reviewing medications, and using patient centered shared decision making involving her cardiac care.  Prior to her visit I spent greater than 20 minutes reviewing her past medical history,  medications, and prior cardiac tests.

## 2020-11-19 ENCOUNTER — Ambulatory Visit: Payer: Medicare HMO | Admitting: General Practice

## 2020-12-02 NOTE — Progress Notes (Signed)
Cardiology Clinic Note   Patient Name: Kelsey Bridges Date of Encounter: 12/03/2020  Primary Care Provider:  [facility] Cardiologist:  Quay Burow, MD  Patient Profile    Kelsey Bridges 70 year old female presents the clinic today for follow-up evaluation of her paroxysmal atrial fibrillation and essential hypertension.  Past Medical History    Past Medical History:  Diagnosis Date  . Abnormal Pap smear   . Hammer toe   . Hypertension    Past Surgical History:  Procedure Laterality Date  . ABDOMINAL HYSTERECTOMY    . LAPAROSCOPIC SUPRACERVICAL HYSTERECTOMY    . left ovary  1988  . Varicose veins      Allergies  Allergies  Allergen Reactions  . Penicillins     Did it involve swelling of the face/tongue/throat, SOB, or low BP? No Did it involve sudden or severe rash/hives, skin peeling, or any reaction on the inside of your mouth or nose? Yes Did you need to seek medical attention at a hospital or doctor's office? No When did it last happen?More than 10 If all above answers are "NO", may proceed with cephalosporin use.  Pt states "she can take the tablet but not the injection"    History of Present Illness    Kelsey Bridges has a PMH of paroxysmal atrial fibrillation, essential hypertension, mixed hyperlipidemia, and tobacco abuse.  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  She was referred by Dr. Lavonia Drafts when she was noted to have paroxysmal atrial fibrillation.  She denies family history of heart disease.  She reported she had never had CVA or heart attack.  She was last seen by Dr. Gwenlyn Found on 05/15/2020.  During that time she denied chest pain shortness of breath.  She did not notice her atrial fibrillation.  She was started on Eliquis.  She reported some elevated blood pressures and was instructed to keep a blood pressure log.  She was also referred to cardiology Pharm.D. for further evaluation.  CHA2DS2-VASc  score 3.  She presents the clinic today for follow-up evaluation states she feels well.  She brings in a blue and white pill and reports that she does need a refill of the medication.  She reports that she had thrown away her medication bottle.  The medication was taken to pharmacy and identified as diltiazem.  She is not short of breath.  We will refill it at this time.  She remains very physically active.  She is volunteering in her spare time where she lives.  She is walking over an hour a day and she also helps a friend keep her apartment clean.  She states that she occasionally notes this is her heartbeat but since starting the diltiazem medication she does not notice her heart beating fast.  She denies bleeding issues with her Eliquis.  She reports that she continues to maintain her CNA license.  She reports that she only smokes cigarettes when she drinks beer.  She also reports that she is having trouble staying asleep at night.  I will continue her current medication, order a fasting lipid panel, give her the smoking cessation information, give her the sleep hygiene information and have her return in 6 months.  I will also give her the [place] support stocking sheet  Today she denies chest pain, shortness of breath, lower extremity edema, fatigue, palpitations, melena, hematuria, hemoptysis, diaphoresis, weakness, presyncope, syncope, orthopnea, and PND.   Home Medications  Prior to Admission medications   Medication Sig Start Date End Date Taking? Authorizing Provider  diltiazem (CARDIZEM CD) 240 MG 24 hr capsule Take 1 capsule (240 mg total) by mouth daily. 10/29/20   Lorretta Harp, MD  ELIQUIS 5 MG TABS tablet  04/21/20   [provider]  ibuprofen (ADVIL) 800 MG tablet  04/18/20   [provider]  losartan (COZAAR) 100 MG tablet Take 1 tablet (100 mg total) by mouth daily. 07/01/20   Lorretta Harp, MD  ondansetron (ZOFRAN ODT) 4 MG disintegrating tablet Take 1  tablet (4 mg total) by mouth every 8 (eight) hours as needed for nausea or vomiting. 12/28/18   Henderly, Britni A, PA-C  sertraline (ZOLOFT) 100 MG tablet  04/20/20   [provider]  traZODone (DESYREL) 50 MG tablet  04/18/20   [provider]    Family History    Family History  Problem Relation Age of Onset  . Cancer Brother        liver  . Cancer Sister        breast   She indicated that her sister is alive. She indicated that her brother is deceased.  Social History    Social History   Socioeconomic History  . Marital status: Widowed    Spouse name: Not on file  . Number of children: Not on file  . Years of education: Not on file  . Highest education level: Not on file  Occupational History  . Not on file  Tobacco Use  . Smoking status: Current Every Day Smoker    Packs/day: 0.20    Types: Cigarettes  . Smokeless tobacco: Current User    Types: Snuff  Vaping Use  . Vaping Use: Never used  Substance and Sexual Activity  . Alcohol use: Yes    Comment: "at the holidays maybe"  . Drug use: No  . Sexual activity: Not Currently    Birth control/protection: Surgical  Other Topics Concern  . Not on file  Social History Narrative  . Not on file   Social Determinants of Health   Financial Resource Strain: Not on file  Food Insecurity: Not on file  Transportation Needs: Not on file  Physical Activity: Not on file  Stress: Not on file  Social Connections: Not on file  Intimate Partner Violence: Not on file     Review of Systems    General:  No chills, fever, night sweats or weight changes.  Cardiovascular:  No chest pain, dyspnea on exertion, edema, orthopnea, palpitations, paroxysmal nocturnal dyspnea. Dermatological: No rash, lesions/masses Respiratory: No cough, dyspnea Urologic: No hematuria, dysuria Abdominal:   No nausea, vomiting, diarrhea, bright red blood per rectum, melena, or hematemesis Neurologic:  No visual changes, wkns, changes  in mental status. All other systems reviewed and are otherwise negative except as noted above.  Physical Exam    VS:  BP 106/60   Pulse 62   Ht 5\' 1"  (1.549 m)   Wt 142 lb (64.4 kg)   SpO2 98%   BMI 26.83 kg/m  , BMI Body mass index is 26.83 kg/m. GEN: Well nourished, well developed, in no acute distress. HEENT: normal. Neck: Supple, no JVD, carotid bruits, or masses. Cardiac: RRR, no murmurs, rubs, or gallops. No clubbing, cyanosis, edema.  Radials/DP/PT 2+ and equal bilaterally.  Respiratory:  Respirations regular and unlabored, clear to auscultation bilaterally. GI: Soft, nontender, nondistended, BS + x 4. MS: no deformity or atrophy. Skin: warm and dry,  no rash. Neuro:  Strength and sensation are intact. Psych: Normal affect.  Accessory Clinical Findings    Recent Labs: 06/28/2020: BUN 12; Creatinine, Ser 0.82; Potassium 4.5; Sodium 139   Recent Lipid Panel    Component Value Date/Time   CHOL 179 07/12/2007 2136   TRIG 82 07/12/2007 2136   HDL 58 07/12/2007 2136   CHOLHDL 3.1 Ratio 07/12/2007 2136   VLDL 16 07/12/2007 2136   LDLCALC 105 (H) 07/12/2007 2136    ECG personally reviewed by me today-none today.  Echocardiogram 06/28/2020 IMPRESSIONS    1. Left ventricular ejection fraction, by estimation, is 70 to 75%. The  left ventricle has hyperdynamic function. The left ventricle has no  regional wall motion abnormalities. There is mild left ventricular  hypertrophy of the basal-septal segment. Left  ventricular diastolic function could not be evaluated.  2. Right ventricular systolic function is normal. The right ventricular  size is normal. There is normal pulmonary artery systolic pressure. The  estimated right ventricular systolic pressure is 93.7 mmHg.  3. Left atrial size was severely dilated.  4. The mitral valve is normal in structure. Mild to moderate mitral valve  regurgitation. No evidence of mitral stenosis.  5. The aortic valve is tricuspid.  Aortic valve regurgitation is trivial.  No aortic stenosis is present.  6. The inferior vena cava is normal in size with greater than 50%  respiratory variability, suggesting right atrial pressure of 3 mmHg.  7. There is right bowing of the interatrial septum, suggestive of  elevated left atrial pressure. No atrial level shunt detected by color  flow Doppler.   Assessment & Plan   1.  Paroxysmal atrial fibrillation-heart rate today 62.  Remains cardiac unaware.  CHA2DS2-VASc score 3.  August 21 EKG showed A. fib with ventricular response of 100.  She was started on diltiazem 120 mg at that time.  Denies bleeding issues.  Reports compliance with Eliquis. Continue diltiazem, apixaban Heart healthy low-sodium diet-salty 6 given Increase physical activity as tolerated Avoid triggers caffeine, chocolate, EtOH, dehydration etc.  support stocking sheet given  Essential hypertension-BP today 106/60.  Well-controlled at home. Continue diltiazem, losartan Heart healthy low-sodium diet-salty 6 given Increase physical activity as tolerated  Hyperlipidemia-LDL 105 07/12/2007 Heart healthy low-sodium high-fiber diet Increase physical activity as tolerated Repeat fasting lipid panel  Tobacco abuse-continues to smoke 1/4 pack/day.  Smoking cessation recommended Smoking cessation information given  Disposition: Follow-up with Dr. Gwenlyn Found or me in 6 months.    Jossie Ng. Katelan Hirt NP-C    12/03/2020, 12:22 PM [facility] [place] [address] Office [phone] Fax [phone]  Notice: This dictation was prepared with Dragon dictation along with smaller phrase technology. Any transcriptional errors that result from this process are unintentional and may not be corrected upon review.  I spent 15 minutes examining this patient, reviewing medications, and using patient centered shared decision making involving her cardiac care.  Prior to her visit I spent  greater than 20 minutes reviewing her past medical history,  medications, and prior cardiac tests.

## 2020-12-03 ENCOUNTER — Encounter: Payer: Self-pay | Admitting: General Practice

## 2020-12-03 ENCOUNTER — Other Ambulatory Visit: Payer: Self-pay

## 2020-12-03 ENCOUNTER — Ambulatory Visit (INDEPENDENT_AMBULATORY_CARE_PROVIDER_SITE_OTHER): Payer: Medicare HMO | Admitting: General Practice

## 2020-12-03 VITALS — BP 106/60 | HR 62 | Ht 61.0 in | Wt 142.0 lb

## 2020-12-03 DIAGNOSIS — Z79899 Other long term (current) drug therapy: Secondary | ICD-10-CM

## 2020-12-03 DIAGNOSIS — I480 Paroxysmal atrial fibrillation: Secondary | ICD-10-CM | POA: Diagnosis not present

## 2020-12-03 DIAGNOSIS — E782 Mixed hyperlipidemia: Secondary | ICD-10-CM

## 2020-12-03 DIAGNOSIS — I10 Essential (primary) hypertension: Secondary | ICD-10-CM

## 2020-12-03 DIAGNOSIS — Z720 Tobacco use: Secondary | ICD-10-CM

## 2020-12-03 MED ORDER — LOSARTAN POTASSIUM 100 MG PO TABS: 100.0000 mg | ORAL_TABLET | Freq: Every day | ORAL | 3 refills | Status: DC

## 2020-12-03 MED ORDER — DILTIAZEM HCL ER COATED BEADS 240 MG PO CP24: 240.0000 mg | ORAL_CAPSULE | Freq: Every day | ORAL | 3 refills | Status: DC

## 2020-12-03 NOTE — Patient Instructions (Addendum)
Medication Instructions:  The current medical regimen is effective;  continue present plan and medications as directed. Please refer to the Current Medication list given to you today.  *If you need a refill on your cardiac medications before your next appointment, please call your pharmacy*  Lab Work:   Testing/Procedures:  FASTING LIPID  NONE  Special Instructions STOP DRINKING BEER AND SMOKING CIGARETTES  HAVE YOUR PRIMARY DOCTOR REFILL SERTRALINE   PLEASE READ AND FOLLOW SLEEP TIPS-ATTACHED  PLEASE MAINTAIN PHYSICAL ACTIVITY AS TOLERATED  PLEASE READ AND FOLLOW SMOKING CESSATION TIPS-ATTACHED  Follow-Up: Your next appointment:  6 month(s) In Person with Quay Burow, MD OR IF UNAVAILABLE JESSE CLEAVER, FNP-C   Please call our office 2 months in advance to schedule this appointment   At [facility], you and your health needs are our priority.  As part of our continuing mission to provide you with exceptional heart care, we have created designated Provider Care Teams.  These Care Teams include your primary Cardiologist (physician) and Advanced Practice Providers (APPs -  Physician Assistants and Nurse Practitioners) who all work together to provide you with the care you need, when you need it.  We recommend signing up for the patient portal called "MyChart".  Sign up information is provided on this After Visit Summary.  MyChart is used to connect with patients for Virtual Visits (Telemedicine).  Patients are able to view lab/test results, encounter notes, upcoming appointments, etc.  Non-urgent messages can be sent to your provider as well.   To learn more about what you can do with MyChart, go to [email or web address].      Quality Sleep Information, Adult Quality sleep is important for your mental and physical health. It also improves your quality of life. Quality sleep means you:  Are asleep for most of the time you are in bed.  Fall asleep within 30 minutes.  Wake up  no more than once a night.  Are awake for no longer than 20 minutes if you do wake up during the night. Most adults need 7-8 hours of quality sleep each night. How can poor sleep affect me? If you do not get enough quality sleep, you may have:  Mood swings.  Daytime sleepiness.  Confusion.  Decreased reaction time.  Sleep disorders, such as insomnia and sleep apnea.  Difficulty with: ? Solving problems. ? Coping with stress. ? Paying attention. These issues may affect your performance and productivity at work, school, and at home. Lack of sleep may also put you at higher risk for accidents, suicide, and risky behaviors. If you do not get quality sleep you may also be at higher risk for several health problems, including:  Infections.  Type 2 diabetes.  Heart disease.  High blood pressure.  Obesity.  Worsening of long-term conditions, like arthritis, kidney disease, depression, Parkinson's disease, and epilepsy. What actions can I take to get more quality sleep?  Stick to a sleep schedule. Go to sleep and wake up at about the same time each day. Do not try to sleep less on weekdays and make up for lost sleep on weekends. This does not work.  Try to get about 30 minutes of exercise on most days. Do not exercise 2-3 hours before going to bed.  Limit naps during the day to 30 minutes or less.  Do not use any products that contain nicotine or tobacco, such as cigarettes or e-cigarettes. If you need help quitting, ask your health care provider.  Do not drink caffeinated  beverages for at least 8 hours before going to bed. Coffee, tea, and some sodas contain caffeine.  Do not drink alcohol close to bedtime.  Do not eat large meals close to bedtime.  Do not take naps in the late afternoon.  Try to get at least 30 minutes of sunlight every day. Morning sunlight is best.  Make time to relax before bed. Reading, listening to music, or taking a hot bath promotes quality  sleep.  Make your bedroom a place that promotes quality sleep. Keep your bedroom dark, quiet, and at a comfortable room temperature. Make sure your bed is comfortable. Take out sleep distractions like TV, a computer, smartphone, and bright lights.  If you are lying awake in bed for longer than 20 minutes, get up and do a relaxing activity until you feel sleepy.  Work with your health care provider to treat medical conditions that may affect sleeping, such as: ? Nasal obstruction. ? Snoring. ? Sleep apnea and other sleep disorders.  Talk to your health care provider if you think any of your prescription medicines may cause you to have difficulty falling or staying asleep.  If you have sleep problems, talk with a sleep consultant. If you think you have a sleep disorder, talk with your health care provider about getting evaluated by a specialist.      Where to find more information  [place] website: https://sleepfoundation.org  National Heart, Lung, and [place] ([place]): http://www.saunders.info/.pdf  Centers for Disease Control and Prevention (CDC): LearningDermatology.pl Contact a health care provider if you:  Have trouble getting to sleep or staying asleep.  Often wake up very early in the morning and cannot get back to sleep.  Have daytime sleepiness.  Have daytime sleep attacks of suddenly falling asleep and sudden muscle weakness (narcolepsy).  Have a tingling sensation in your legs with a strong urge to move your legs (restless legs syndrome).  Stop breathing briefly during sleep (sleep apnea).  Think you have a sleep disorder or are taking a medicine that is affecting your quality of sleep. Summary  Most adults need 7-8 hours of quality sleep each night.  Getting enough quality sleep is an important part of health and well-being.  Make your bedroom a place that promotes quality sleep and avoid things that  may cause you to have poor sleep, such as alcohol, caffeine, smoking, and large meals.  Talk to your health care provider if you have trouble falling asleep or staying asleep. This information is not intended to replace advice given to you by your health care provider. Make sure you discuss any questions you have with your health care provider. Document Revised: 01/05/2018 Document Reviewed: 01/05/2018 Elsevier Patient Education  2021 [place].   Steps to Quit Smoking Smoking tobacco is the leading cause of preventable death. It can affect almost every organ in the body. Smoking puts you and people around you at risk for many serious, long-lasting (chronic) diseases. Quitting smoking can be hard, but it is one of the best things that you can do for your health. It is never too late to quit. How do I get ready to quit? When you decide to quit smoking, make a plan to help you succeed. Before you quit:  Pick a date to quit. Set a date within the next 2 weeks to give you time to prepare.  Write down the reasons why you are quitting. Keep this list in places where you will see it often.  Tell  your family, friends, and co-workers that you are quitting. Their support is important.  Talk with your doctor about the choices that may help you quit.  Find out if your health insurance will pay for these treatments.  Know the people, places, things, and activities that make you want to smoke (triggers). Avoid them. What first steps can I take to quit smoking?  Throw away all cigarettes at home, at work, and in your car.  Throw away the things that you use when you smoke, such as ashtrays and lighters.  Clean your car. Make sure to empty the ashtray.  Clean your home, including curtains and carpets. What can I do to help me quit smoking? Talk with your doctor about taking medicines and seeing a counselor at the same time. You are more likely to succeed when you do both.  If you are pregnant  or breastfeeding, talk with your doctor about counseling or other ways to quit smoking. Do not take medicine to help you quit smoking unless your doctor tells you to do so. To quit smoking: Quit right away  Quit smoking totally, instead of slowly cutting back on how much you smoke over a period of time.  Go to counseling. You are more likely to quit if you go to counseling sessions regularly. Take medicine You may take medicines to help you quit. Some medicines need a prescription, and some you can buy over-the-counter. Some medicines may contain a drug called nicotine to replace the nicotine in cigarettes. Medicines may:  Help you to stop having the desire to smoke (cravings).  Help to stop the problems that come when you stop smoking (withdrawal symptoms). Your doctor may ask you to use:  Nicotine patches, gum, or lozenges.  Nicotine inhalers or sprays.  Non-nicotine medicine that is taken by mouth. Find resources Find resources and other ways to help you quit smoking and remain smoke-free after you quit. These resources are most helpful when you use them often. They include:  Online chats with a Social worker.  Phone quitlines.  Printed Furniture conservator/restorer.  Support groups or group counseling.  Text messaging programs.  Mobile phone apps. Use apps on your mobile phone or tablet that can help you stick to your quit plan. There are many free apps for mobile phones and tablets as well as websites. Examples include Quit Guide from the [employer] and smokefree.gov   What things can I do to make it easier to quit?  Talk to your family and friends. Ask them to support and encourage you.  Call a phone quitline (1-800-QUIT-NOW), reach out to support groups, or work with a Social worker.  Ask people who smoke to not smoke around you.  Avoid places that make you want to smoke, such as: ? Bars. ? Parties. ? Smoke-break areas at work.  Spend time with people who do not smoke.  Lower the  stress in your life. Stress can make you want to smoke. Try these things to help your stress: ? Getting regular exercise. ? Doing deep-breathing exercises. ? Doing yoga. ? Meditating. ? Doing a body scan. To do this, close your eyes, focus on one area of your body at a time from head to toe. Notice which parts of your body are tense. Try to relax the muscles in those areas.   How will I feel when I quit smoking? Day 1 to 3 weeks Within the first 24 hours, you may start to have some problems that come from quitting tobacco. These  problems are very bad 2-3 days after you quit, but they do not often last for more than 2-3 weeks. You may get these symptoms:  Mood swings.  Feeling restless, nervous, angry, or annoyed.  Trouble concentrating.  Dizziness.  Strong desire for high-sugar foods and nicotine.  Weight gain.  Trouble pooping (constipation).  Feeling like you may vomit (nausea).  Coughing or a sore throat.  Changes in how the medicines that you take for other issues work in your body.  Depression.  Trouble sleeping (insomnia). Week 3 and afterward After the first 2-3 weeks of quitting, you may start to notice more positive results, such as:  Better sense of smell and taste.  Less coughing and sore throat.  Slower heart rate.  Lower blood pressure.  Clearer skin.  Better breathing.  Fewer sick days. Quitting smoking can be hard. Do not give up if you fail the first time. Some people need to try a few times before they succeed. Do your best to stick to your quit plan, and talk with your doctor if you have any questions or concerns. Summary  Smoking tobacco is the leading cause of preventable death. Quitting smoking can be hard, but it is one of the best things that you can do for your health.  When you decide to quit smoking, make a plan to help you succeed.  Quit smoking right away, not slowly over a period of time.  When you start quitting, seek help from  your doctor, family, or friends. This information is not intended to replace advice given to you by your health care provider. Make sure you discuss any questions you have with your health care provider.

## 2020-12-10 LAB — LIPID PANEL
Chol/HDL Ratio: 3.1 ratio (ref 0.0–4.4)
Cholesterol, Total: 166 mg/dL (ref 100–199)
HDL: 54 mg/dL (ref >39)
LDL Chol Calc (NIH): 99 mg/dL (ref 0–99)
Triglycerides: 70 mg/dL (ref 0–149)
VLDL Cholesterol Cal: 13 mg/dL (ref 5–40)

## 2020-12-25 ENCOUNTER — Ambulatory Visit: Payer: Medicare HMO | Admitting: Cardiovascular Disease

## 2021-01-06 ENCOUNTER — Other Ambulatory Visit: Payer: Self-pay | Admitting: Specialist

## 2021-01-06 DIAGNOSIS — Z1231 Encounter for screening mammogram for malignant neoplasm of breast: Secondary | ICD-10-CM

## 2021-01-07 ENCOUNTER — Other Ambulatory Visit: Payer: Self-pay

## 2021-01-07 MED ORDER — ELIQUIS 5 MG PO TABS: 5.0000 mg | ORAL_TABLET | Freq: Two times a day (BID) | ORAL | 1 refills | Status: DC

## 2021-01-08 ENCOUNTER — Telehealth: Payer: Self-pay | Admitting: General Practice

## 2021-01-08 LAB — IFOBT (OCCULT BLOOD): IFOBT: POSITIVE

## 2021-01-08 MED ORDER — DILTIAZEM HCL ER COATED BEADS 240 MG PO CP24: 240.0000 mg | ORAL_CAPSULE | Freq: Every day | ORAL | 3 refills | Status: DC

## 2021-01-08 NOTE — Telephone Encounter (Signed)
*  STAT* If patient is at the pharmacy, call can be transferred to refill team.   1. Which medications need to be refilled? (please list name of each medication and dose if known)  diltiazem  2. Which pharmacy/location (including street and city if local pharmacy) is medication to be sent to? [facility]  3. Do they need a 30 day or 90 day supply? 90  Days and refills

## 2021-01-24 IMAGING — DX DG CHEST 1V PORT
1 series · 1 of 1 positions shown · non-contrast
Comparison: Radiograph 12/28/2018

CLINICAL DATA: Fever.

EXAM:
PORTABLE CHEST 1 VIEW

[chest]
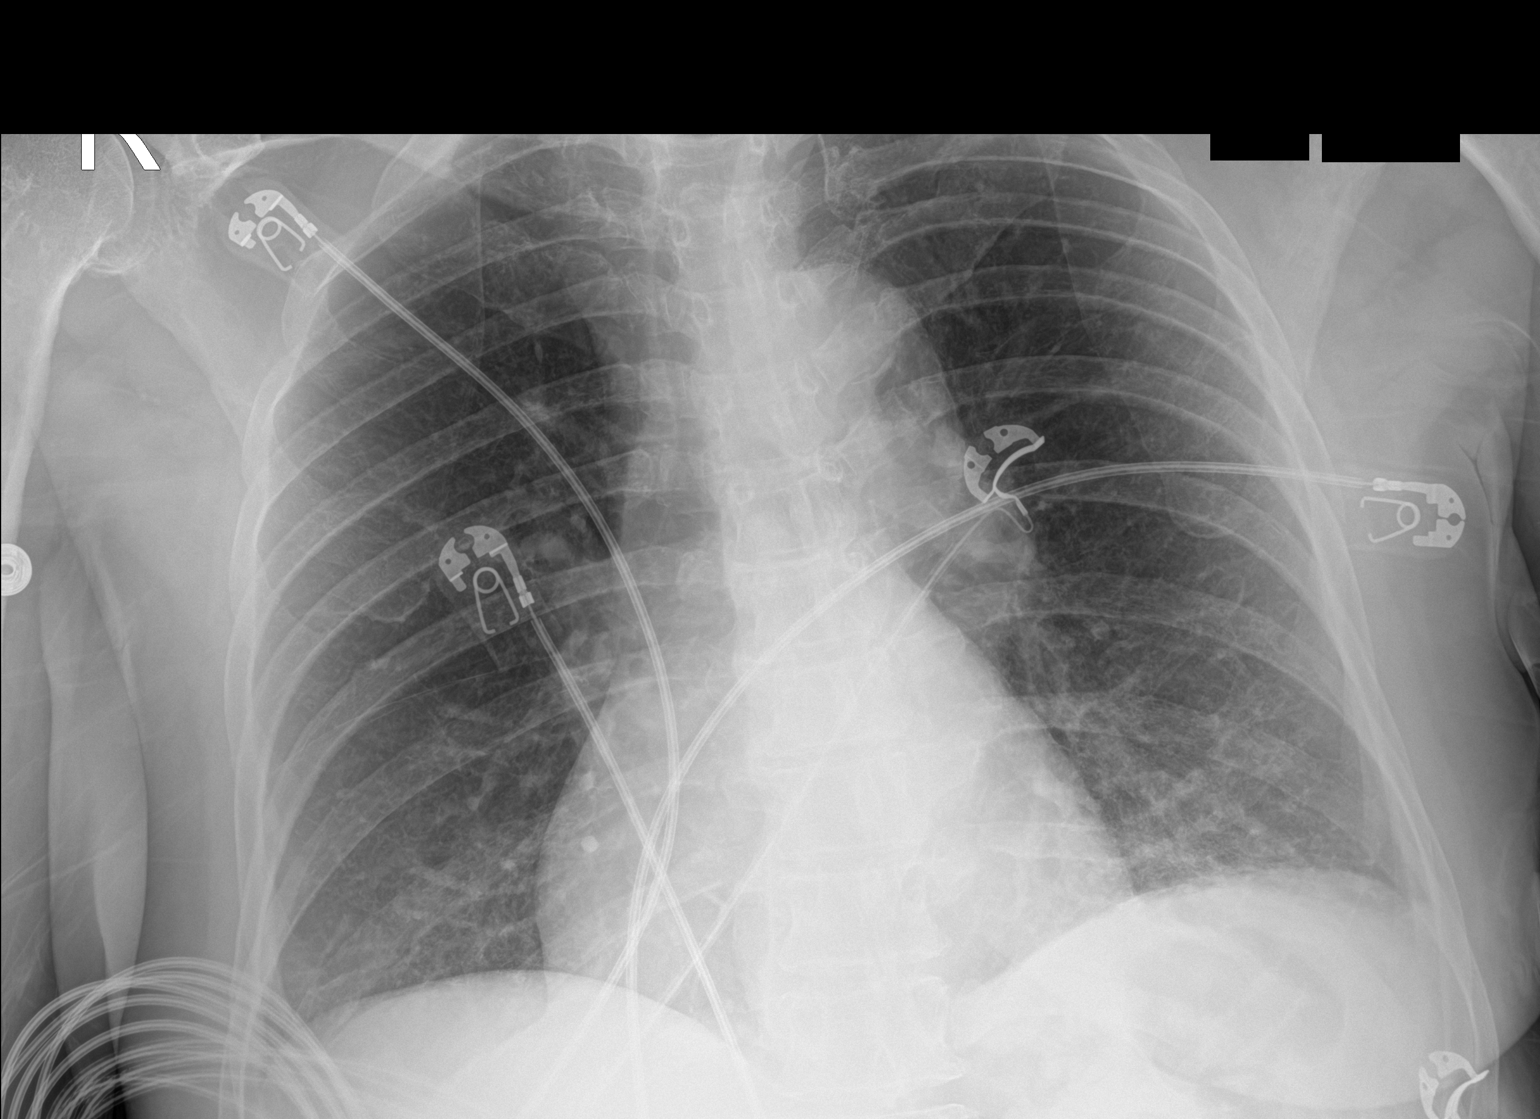

[1 of 1 positions shown; findings below may reference images not displayed]

FINDINGS: Upper normal heart size. Unchanged aortic tortuosity. Patchy
airspace disease in the left greater than right lung base. No
pulmonary edema. No pleural fluid. No pneumothorax. No acute osseous
abnormalities are seen.
IMPRESSION: Patchy airspace disease in the left greater than right lung base,
suspicious for pneumonia.

## 2021-01-29 ENCOUNTER — Encounter: Payer: Self-pay | Admitting: Physician Assistant

## 2021-02-14 ENCOUNTER — Telehealth: Payer: Self-pay

## 2021-02-14 ENCOUNTER — Encounter: Payer: Self-pay | Admitting: Physician Assistant

## 2021-02-14 ENCOUNTER — Other Ambulatory Visit: Payer: Self-pay

## 2021-02-14 ENCOUNTER — Ambulatory Visit (INDEPENDENT_AMBULATORY_CARE_PROVIDER_SITE_OTHER): Payer: Medicare HMO | Admitting: Physician Assistant

## 2021-02-14 VITALS — BP 150/70 | HR 100 | Ht 61.0 in | Wt 137.4 lb

## 2021-02-14 DIAGNOSIS — R1013 Epigastric pain: Secondary | ICD-10-CM | POA: Diagnosis not present

## 2021-02-14 DIAGNOSIS — R195 Other fecal abnormalities: Secondary | ICD-10-CM | POA: Diagnosis not present

## 2021-02-14 DIAGNOSIS — R112 Nausea with vomiting, unspecified: Secondary | ICD-10-CM

## 2021-02-14 MED ORDER — CLENPIQ 10-3.5-12 MG-GM -GM/160ML PO SOLN: 320.0000 mL | Freq: Once | ORAL | 0 refills | Status: AC

## 2021-02-14 NOTE — Telephone Encounter (Signed)
    Kelsey Bridges DOB:  1951-09-14  MRN:  [id number]   Primary Cardiologist: Quay Burow, MD  Chart reviewed as part of pre-operative protocol coverage.   Per pharmacy recommendations, patient can hold eliquis 2 days prior to her upcoming EGD/C-scope with plans to restart as soon as she is cleared to do so by her gastroenterologist.   I will route this recommendation to the requesting party via [facility] fax function and remove from pre-op pool.  Please call with questions.  Abigail Butts, PA-C 02/14/2021, 12:58 PM

## 2021-02-14 NOTE — Telephone Encounter (Signed)
[facility] Pre-operative Risk Assessment     Request for surgical clearance:     Endoscopy Procedure  What type of surgery is being performed?     Endoscopy and Colonoscopy   When is this surgery scheduled?     04/03/21  What type of clearance is required ?   Pharmacy  Are there any medications that need to be held prior to surgery and how long? Eliquis 2 days prior   Practice name and name of physician performing surgery?      [facility]  What is your office phone and fax number?      Phone- [phone]  [phone]  Anesthesia type (None, local, MAC, general) ?       MAC

## 2021-02-14 NOTE — Telephone Encounter (Signed)
Patient with diagnosis of afib on Eliquis for anticoagulation.    Procedure: endoscopy and colonoscopy Date of procedure: 04/03/21  CHA2DS2-VASc Score = 3  This indicates a 3.2% annual risk of stroke. The patient's score is based upon: CHF History: No HTN History: Yes Diabetes History: No Stroke History: No Vascular Disease History: No Age Score: 1 Gender Score: 1   CrCl 23mL/min Platelet count 257K in 2020  Per office protocol, patient can hold Eliquis for 2 days prior to procedure as requested.

## 2021-02-14 NOTE — Progress Notes (Signed)
Chief Complaint: Positive FIT test, nausea, epigastric pain  HPI:    Kelsey Bridges is a 70 year old female with a past medical history as listed below, who was referred to me by [facility] for a complaint of positive FIT test, nausea and epigastric pain.      01/07/2021 positive FIT test.    Today, the patient presents to clinic and has a myriad of various complaints and is a poor historian, so hard to follow.  Her GI symptoms are mostly nausea which sounds like it has been off-and-on over the past couple of months, apparently a month ago she had 2 episodes of vomiting but has not vomited since then but remains somewhat nauseous occasionally.  Also describes an epigastric pain and "growling in there even when I do not feel hungry".  Tells me that she has lost about 5 pounds over the past month without really trying.    Also describes that she had some positive testing at her PCPs office.  She is not really sure what for.    Denies fever, chills, heartburn, reflux, seeing any bright red blood or black tarry stools, change in bowel habits or symptoms that awaken her from sleep  Past Medical History:  Diagnosis Date  . Abnormal Pap smear   . Hammer toe   . Hypertension     Past Surgical History:  Procedure Laterality Date  . ABDOMINAL HYSTERECTOMY    . LAPAROSCOPIC SUPRACERVICAL HYSTERECTOMY    . left ovary  1988  . Varicose veins      Current Outpatient Medications  Medication Sig Dispense Refill  . diltiazem (CARDIZEM CD) 240 MG 24 hr capsule Take 1 capsule (240 mg total) by mouth daily. 90 capsule 3  . ELIQUIS 5 MG TABS tablet Take 1 tablet (5 mg total) by mouth 2 (two) times daily. 180 tablet 1  . ibuprofen (ADVIL) 800 MG tablet     . losartan (COZAAR) 100 MG tablet Take 1 tablet (100 mg total) by mouth daily. 90 tablet 3  . ondansetron (ZOFRAN ODT) 4 MG disintegrating tablet Take 1 tablet (4 mg total) by mouth every 8 (eight) hours as needed for nausea or vomiting.  20 tablet 0  . sertraline (ZOLOFT) 100 MG tablet     . traZODone (DESYREL) 50 MG tablet      No current facility-administered medications for this visit.    Allergies as of 02/14/2021 - Review Complete 12/03/2020  Allergen Reaction Noted  . Penicillins  06/08/2011    Family History  Problem Relation Age of Onset  . Cancer Brother        liver  . Cancer Sister        breast    Social History   Socioeconomic History  . Marital status: Widowed    Spouse name: Not on file  . Number of children: Not on file  . Years of education: Not on file  . Highest education level: Not on file  Occupational History  . Not on file  Tobacco Use  . Smoking status: Current Every Day Smoker    Packs/day: 0.20    Types: Cigarettes  . Smokeless tobacco: Current User    Types: Snuff  Vaping Use  . Vaping Use: Never used  Substance and Sexual Activity  . Alcohol use: Yes    Comment: "at the holidays maybe"  . Drug use: No  . Sexual activity: Not Currently    Birth control/protection: Surgical  Other Topics Concern  .  Not on file  Social History Narrative  . Not on file   Social Determinants of Health   Financial Resource Strain: Not on file  Food Insecurity: Not on file  Transportation Needs: Not on file  Physical Activity: Not on file  Stress: Not on file  Social Connections: Not on file  Intimate Partner Violence: Not on file    Review of Systems:    Constitutional: No  fever or chills Skin: No rash Cardiovascular: No chest pain   Respiratory: No SOB  Gastrointestinal: See HPI and otherwise negative Genitourinary: No dysuria  Neurological: No headache, dizziness or syncope Musculoskeletal: No new muscle or joint pain Hematologic: No bleeding  Psychiatric: No history of depression or anxiety   Physical Exam:  Vital signs: BP (!) 150/70   Pulse 100   Ht '5\' 1"'  (1.549 m)   Wt 137 lb 6.4 oz (62.3 kg)   BMI 25.96 kg/m  Constitutional:   Pleasant Elderly AA female  appears to be in NAD, Well developed, Well nourished, alert and cooperative Head:  Normocephalic and atraumatic. Eyes:   PEERL, EOMI. No icterus. Conjunctiva pink. Ears:  Normal auditory acuity. Neck:  Supple Throat: Oral cavity and pharynx without inflammation, swelling or lesion.  Respiratory: Respirations even and unlabored. Lungs clear to auscultation bilaterally.   No wheezes, crackles, or rhonchi.  Cardiovascular: Normal S1, S2. No MRG. Regular rate and rhythm. No peripheral edema, cyanosis or pallor.  Gastrointestinal:  Soft, nondistended, moderate epigastric TTP with involuntary guarding. Normal bowel sounds. No appreciable masses or hepatomegaly. Rectal:  Not performed.  Msk:  Symmetrical without gross deformities. Without edema, no deformity or joint abnormality.  Neurologic:  Alert and  oriented x4;  grossly normal neurologically.  Skin:   Dry and intact without significant lesions or rashes. Psychiatric:  Demonstrates good judgement and reason without abnormal affect or behaviors.  See HPI for recent labs.  Assessment: 1.  Epigastric pain: With some nausea and a couple episodes of vomiting, is tender on exam with involuntary guarding; question gastritis versus 2.  Nausea: With above 3.  Positive FIT testing  Plan: 1.  Prescribed Pantoprazole 40 mg daily, 30-60 minutes before breakfast #30 with 5 refills. 2.  Scheduled patient for a diagnostic EGD and colonoscopy in the [place] with Dr. Lyndel Safe as he had sooner availability that fit the patient's schedule.  She was given a detailed list of risks for the procedures and she agrees to proceed. 3.  Patient advised to hold her Eliquis for 2 days prior to time of procedure.  We will communicate with her prescribing physician to ensure this is acceptable for her. 4.  Patient to follow in clinic per recommendations from Dr. Lyndel Safe after time of procedure.  He will be her primary GI physician.  Ellouise Newer, PA-C [facility] 02/14/2021, 11:17 AM  Cc: [facility]

## 2021-02-14 NOTE — Patient Instructions (Signed)
If you are age 71 or older, your body mass index should be between 23-30. Your Body mass index is 25.96 kg/m. If this is out of the aforementioned range listed, please consider follow up with your Primary Care Provider.  If you are age 57 or younger, your body mass index should be between 19-25. Your Body mass index is 25.96 kg/m. If this is out of the aformentioned range listed, please consider follow up with your Primary Care Provider.   You have been scheduled for an endoscopy and colonoscopy. Please follow the written instructions given to you at your visit today. Please pick up your prep supplies at the pharmacy within the next 1-3 days. If you use inhalers (even only as needed), please bring them with you on the day of your procedure.  Due to recent changes in healthcare laws, you may see the results of your imaging and laboratory studies on MyChart before your provider has had a chance to review them.  We understand that in some cases there may be results that are confusing or concerning to you. Not all laboratory results come back in the same time frame and the provider may be waiting for multiple results in order to interpret others.  Please give [place] 48 hours in order for your provider to thoroughly review all the results before contacting the office for clarification of your results.   Thank you for choosing me and [facility].  Ellouise Newer, PA-C

## 2021-02-16 NOTE — Progress Notes (Signed)
Note reviewed Agree with assessment/plan as outlined  Carmell Austria MD

## 2021-02-18 ENCOUNTER — Telehealth: Payer: Self-pay | Admitting: Physician Assistant

## 2021-02-18 NOTE — Telephone Encounter (Signed)
Inbound call from patient. States need prior authorization Clenpiq. Patient would like a call back at [phone]

## 2021-02-18 NOTE — Telephone Encounter (Signed)
Called patient and let her know that Clenpiq was not covered by her insurance. I offered to send in a prep that was covered by her insurance. Patient decided to do a miralax prep and I also notified patient to hold Eliquis 2 days before procedure.

## 2021-02-27 ENCOUNTER — Other Ambulatory Visit: Payer: Self-pay

## 2021-02-27 ENCOUNTER — Ambulatory Visit
Admission: RE | Admit: 2021-02-27 | Discharge: 2021-02-27 | Disposition: A | Discharge status: Home / Self Care | Payer: Medicare HMO | Source: Ambulatory Visit | Attending: Specialist | Admitting: Specialist

## 2021-02-27 ENCOUNTER — Ambulatory Visit: Payer: Medicare HMO

## 2021-02-27 DIAGNOSIS — Z1231 Encounter for screening mammogram for malignant neoplasm of breast: Secondary | ICD-10-CM

## 2021-04-02 ENCOUNTER — Encounter: Payer: Self-pay | Admitting: Certified Registered Nurse Anesthetist

## 2021-04-03 ENCOUNTER — Other Ambulatory Visit: Payer: Self-pay

## 2021-04-03 ENCOUNTER — Encounter: Payer: Self-pay | Admitting: Gastroenterology

## 2021-04-03 ENCOUNTER — Other Ambulatory Visit (INDEPENDENT_AMBULATORY_CARE_PROVIDER_SITE_OTHER): Payer: Medicare HMO

## 2021-04-03 ENCOUNTER — Ambulatory Visit (AMBULATORY_SURGERY_CENTER): Payer: Medicare HMO | Admitting: Gastroenterology

## 2021-04-03 VITALS — BP 175/95 | HR 77 | Temp 98.4°F | Resp 22 | Ht 61.0 in | Wt 137.0 lb

## 2021-04-03 DIAGNOSIS — K6389 Other specified diseases of intestine: Secondary | ICD-10-CM

## 2021-04-03 DIAGNOSIS — K648 Other hemorrhoids: Secondary | ICD-10-CM | POA: Diagnosis not present

## 2021-04-03 DIAGNOSIS — R112 Nausea with vomiting, unspecified: Secondary | ICD-10-CM

## 2021-04-03 DIAGNOSIS — D122 Benign neoplasm of ascending colon: Secondary | ICD-10-CM

## 2021-04-03 DIAGNOSIS — K2970 Gastritis, unspecified, without bleeding: Secondary | ICD-10-CM | POA: Diagnosis not present

## 2021-04-03 DIAGNOSIS — B9681 Helicobacter pylori [H. pylori] as the cause of diseases classified elsewhere: Secondary | ICD-10-CM

## 2021-04-03 DIAGNOSIS — C186 Malignant neoplasm of descending colon: Secondary | ICD-10-CM | POA: Diagnosis not present

## 2021-04-03 DIAGNOSIS — D123 Benign neoplasm of transverse colon: Secondary | ICD-10-CM

## 2021-04-03 DIAGNOSIS — R195 Other fecal abnormalities: Secondary | ICD-10-CM | POA: Diagnosis not present

## 2021-04-03 DIAGNOSIS — R1013 Epigastric pain: Secondary | ICD-10-CM

## 2021-04-03 LAB — COMPREHENSIVE METABOLIC PANEL
ALT: 11 U/L (ref 0–35)
AST: 15 U/L (ref 0–37)
Albumin: 3.7 g/dL (ref 3.5–5.2)
Alkaline Phosphatase: 88 U/L (ref 39–117)
BUN: 11 mg/dL (ref 6–23)
CO2: 22 mEq/L (ref 19–32)
Calcium: 8.5 mg/dL (ref 8.4–10.5)
Chloride: 109 mEq/L (ref 96–112)
Creatinine, Ser: 0.81 mg/dL (ref 0.40–1.20)
GFR: 73.36 mL/min (ref >60.00)
Glucose, Bld: 107 mg/dL — ABNORMAL HIGH (ref 70–99)
Potassium: 3.7 mEq/L (ref 3.5–5.1)
Sodium: 140 mEq/L (ref 135–145)
Total Bilirubin: 0.4 mg/dL (ref 0.2–1.2)
Total Protein: 6.7 g/dL (ref 6.0–8.3)

## 2021-04-03 LAB — CBC WITH DIFFERENTIAL/PLATELET
Basophils Absolute: 0.1 10*3/uL (ref 0.0–0.1)
Basophils Relative: 0.8 % (ref 0.0–3.0)
Eosinophils Absolute: 0.7 10*3/uL (ref 0.0–0.7)
Eosinophils Relative: 10.2 % — ABNORMAL HIGH (ref 0.0–5.0)
HCT: 27.9 % — ABNORMAL LOW (ref 36.0–46.0)
Hemoglobin: 9 g/dL — ABNORMAL LOW (ref 12.0–15.0)
Lymphocytes Relative: 18.1 % (ref 12.0–46.0)
Lymphs Abs: 1.3 10*3/uL (ref 0.7–4.0)
MCHC: 32.1 g/dL (ref 30.0–36.0)
MCV: 73.3 fl — ABNORMAL LOW (ref 78.0–100.0)
Monocytes Absolute: 0.5 10*3/uL (ref 0.1–1.0)
Monocytes Relative: 6.8 % (ref 3.0–12.0)
Neutro Abs: 4.6 10*3/uL (ref 1.4–7.7)
Neutrophils Relative %: 64.1 % (ref 43.0–77.0)
Platelets: 357 10*3/uL (ref 150.0–400.0)
RBC: 3.81 Mil/uL — ABNORMAL LOW (ref 3.87–5.11)
RDW: 17.2 % — ABNORMAL HIGH (ref 11.5–15.5)
WBC: 7.2 10*3/uL (ref 4.0–10.5)

## 2021-04-03 MED ORDER — FLEET ENEMA 7-19 GM/118ML RE ENEM
1.0000 | ENEMA | Freq: Once | RECTAL | Status: AC
  Administered 2021-04-03: 1 via RECTAL

## 2021-04-03 MED ORDER — SODIUM CHLORIDE 0.9 % IV SOLN: 500.0000 mL | Freq: Once | INTRAVENOUS | Status: DC

## 2021-04-03 NOTE — Progress Notes (Signed)
Order for labs have already been entered by Dr.Gupta.

## 2021-04-03 NOTE — Progress Notes (Signed)
Called to room to assist during endoscopic procedure.  Patient ID and intended procedure confirmed with present staff. Received instructions for my participation in the procedure from the performing physician.  

## 2021-04-03 NOTE — Patient Instructions (Signed)
YOU HAD AN ENDOSCOPIC PROCEDURE TODAY AT [facility]:   Refer to the procedure report that was given to you for any specific questions about what was found during the examination.  If the procedure report does not answer your questions, please call your gastroenterologist to clarify.  If you requested that your care partner not be given the details of your procedure findings, then the procedure report has been included in a sealed envelope for you to review at your convenience later.  YOU SHOULD EXPECT: Some feelings of bloating in the abdomen. Passage of more gas than usual.  Walking can help get rid of the air that was put into your GI tract during the procedure and reduce the bloating. If you had a lower endoscopy (such as a colonoscopy or flexible sigmoidoscopy) you may notice spotting of blood in your stool or on the toilet paper. If you underwent a bowel prep for your procedure, you may not have a normal bowel movement for a few days.  Please Note:  You might notice some irritation and congestion in your nose or some drainage.  This is from the oxygen used during your procedure.  There is no need for concern and it should clear up in a day or so.  SYMPTOMS TO REPORT IMMEDIATELY:  Following lower endoscopy (colonoscopy or flexible sigmoidoscopy):  Excessive amounts of blood in the stool  Significant tenderness or worsening of abdominal pains  Swelling of the abdomen that is new, acute  Fever of 100F or higher  Following upper endoscopy (EGD)  Vomiting of blood or coffee ground material  New chest pain or pain under the shoulder blades  Painful or persistently difficult swallowing  New shortness of breath  Fever of 100F or higher  Black, tarry-looking stools  For urgent or emergent issues, a gastroenterologist can be reached at any hour by calling [phone]. Do not use MyChart messaging for urgent concerns.    DIET:  We do recommend a small meal at first, but  then you may proceed to your regular diet.  Drink plenty of fluids but you should avoid alcoholic beverages for 24 hours.  ACTIVITY:  You should plan to take it easy for the rest of today and you should NOT DRIVE or use heavy machinery until tomorrow (because of the sedation medicines used during the test).    FOLLOW UP: Our staff will call the number listed on your records 48-72 hours following your procedure to check on you and address any questions or concerns that you may have regarding the information given to you following your procedure. If we do not reach you, we will leave a message.  We will attempt to reach you two times.  During this call, we will ask if you have developed any symptoms of COVID 19. If you develop any symptoms (ie: fever, flu-like symptoms, shortness of breath, cough etc.) before then, please call [phone].  If you test positive for Covid 19 in the 2 weeks post procedure, please call and report this information to [place].    If any biopsies were taken you will be contacted by phone or by letter within the next 1-3 weeks.  Please call us at [phone] if you have not heard about the biopsies in 3 weeks.    SIGNATURES/CONFIDENTIALITY: You and/or your care partner have signed paperwork which will be entered into your electronic medical record.  These signatures attest to the fact that that the information above on your After  Visit Summary has been reviewed and is understood.  Full responsibility of the confidentiality of this discharge information lies with you and/or your care-partner.    RESUME ELIQUIS AT PRIOR DOSE IN 2 DAYS START 04/05/21,RESUME REMAINDER OF MEDICATIONS. CHECK CBC,CMP,CEA LEVEL TODAY. OFFICE WILL CALL WITH APPOINTMENT FOR CT,CONTRAST SENT WITH PATIENT. INFORMATION GIVEN ON POLYPS AND GASTRITIS.

## 2021-04-03 NOTE — Progress Notes (Signed)
Report given to PACU, vss 

## 2021-04-03 NOTE — Op Note (Signed)
[facility] Patient Name: Kelsey Bridges Procedure Date: 04/03/2021 2:24 PM MRN: [id number] Endoscopist: Jackquline Denmark , MD Age: 70 Referring MD:  Date of Birth: 1951/02/12 Gender: Female Account #: [id number] Procedure:                Colonoscopy Indications:              Positive fecal immunochemical test Medicines:                Monitored Anesthesia Care Procedure:                Pre-Anesthesia Assessment:                           - Prior to the procedure, a History and Physical                            was performed, and patient medications and                            allergies were reviewed. The patient's tolerance of                            previous anesthesia was also reviewed. The risks                            and benefits of the procedure and the sedation                            options and risks were discussed with the patient.                            All questions were answered, and informed consent                            was obtained. Prior Anticoagulants: The patient has                            taken Eliquis (apixaban), last dose was 2 days                            prior to procedure. ASA Grade Assessment: III - A                            patient with severe systemic disease. After                            reviewing the risks and benefits, the patient was                            deemed in satisfactory condition to undergo the                            procedure.  After obtaining informed consent, the colonoscope                            was passed under direct vision. Throughout the                            procedure, the patient's blood pressure, pulse, and                            oxygen saturations were monitored continuously. The                            Olympus PFC-H190DL (#[id number]) Colonoscope was                            introduced through the anus and advanced to the the                             cecum, identified by appendiceal orifice and                            ileocecal valve. The colonoscopy was performed                            without difficulty. The patient tolerated the                            procedure well. The quality of the bowel                            preparation was adequate to identify polyps. The                            ileocecal valve, appendiceal orifice, and rectum                            were photographed. Scope In: 2:36:15 PM Scope Out: 3:14:44 PM Scope Withdrawal Time: 0 hours 30 minutes 48 seconds  Total Procedure Duration: 0 hours 38 minutes 29 seconds  Findings:                 A 4 cm fungating, infiltrative, friable, necrotic                            and ulcerated partially obstructing medium-sized                            apple core mass was found in the mid descending                            colon. The mass was circumferential. The PCF H190                            scope was barely passed beyond. This was biopsied  with a cold forceps for histology and biopsies sent                            [facility]. Area distal to the mass was tattooed with an                            injection of 3 mL of Spot (carbon black).                           Two sessile polyps were found in the proximal                            ascending colon. The polyps were 4 to 6 mm in size.                            These polyps were removed with a cold snare.                            Resection and retrieval were complete.                           Two semi-sessile polyps were found in the splenic                            flexure and mid transverse colon. The polyps were                            10 to 12 mm in size. These polyps were removed with                            a hot snare. Resection and retrieval were complete.                           Noted the colon was highly redundant. The quality                             of preparation was fair especially in the right                            side of the colon. Nevertheless aggressive                            suctioning and aspiration was performed. Overall                            the examination was adequate.                           Non-bleeding internal hemorrhoids were found during                            retroflexion. The hemorrhoids were moderate. Complications:  No immediate complications. Estimated Blood Loss:     Estimated blood loss: none. Impression:               - Malignant partially obstructing tumor in the mid                            descending colon. Biopsied. Tattooed.                           - Two 4 to 6 mm polyps in the proximal ascending                            colon, removed with a cold snare. Resected and                            retrieved.                           - Two 10 to 12 mm polyps at the splenic flexure and                            in the mid transverse colon, removed with a hot                            snare. Resected and retrieved.                           - Non-bleeding internal hemorrhoids. Recommendation:           - Patient has a contact number available for                            emergencies. The signs and symptoms of potential                            delayed complications were discussed with the                            patient. Return to normal activities tomorrow.                            Written discharge instructions were provided to the                            patient.                           - Resume previous diet.                           - Continue present medications.                           - Resume Eliquis (apixaban) at prior dose in 2 days.                           -  Check CBC, CMP, CEA level today.                           - Proceed with CT chest, Abdo/pelvis with p.o. and                            IV contrast.                            - Await pathology results. Jackquline Denmark, MD 04/03/2021 3:26:22 PM This report has been signed electronically.

## 2021-04-03 NOTE — Progress Notes (Signed)
Last BM dark and cloudy. Reported to MD. Enema administered as ordered. Last BM still cloudy post enema, reported to MD.

## 2021-04-03 NOTE — Op Note (Signed)
[facility] Patient Name: Kelsey Bridges Procedure Date: 04/03/2021 2:25 PM MRN: [id number] Endoscopist: Jackquline Denmark , MD Age: 70 Referring MD:  Date of Birth: [birth year] Gender: Female Account #: [id number] Procedure:                Upper GI endoscopy Indications:              Intermittent nausea/vomiting. Medicines:                Monitored Anesthesia Care Procedure:                Pre-Anesthesia Assessment:                           - Prior to the procedure, a History and Physical                            was performed, and patient medications and                            allergies were reviewed. The patient's tolerance of                            previous anesthesia was also reviewed. The risks                            and benefits of the procedure and the sedation                            options and risks were discussed with the patient.                            All questions were answered, and informed consent                            was obtained. Prior Anticoagulants: The patient has                            taken Eliquis (apixaban), last dose was 2 days                            prior to procedure. ASA Grade Assessment: III - A                            patient with severe systemic disease. After                            reviewing the risks and benefits, the patient was                            deemed in satisfactory condition to undergo the                            procedure.  After obtaining informed consent, the endoscope was                            passed under direct vision. Throughout the                            procedure, the patient's blood pressure, pulse, and                            oxygen saturations were monitored continuously. The                            Endoscope was introduced through the mouth, and                            advanced to the second part of duodenum. The upper                             GI endoscopy was accomplished without difficulty.                            The patient tolerated the procedure well. Scope In: Scope Out: Findings:                 The examined esophagus was normal.                           Localized mild inflammation characterized by                            erythema was found in the gastric body. Biopsies                            were taken with a cold forceps for histology.                           The examined duodenum was normal. Biopsies for                            histology were taken with a cold forceps for                            evaluation of celiac disease. Complications:            No immediate complications. Estimated Blood Loss:     Estimated blood loss: none. Impression:               - Minimal gastritis. Recommendation:           - Patient has a contact number available for                            emergencies. The signs and symptoms of potential                            delayed complications were discussed with the  patient. Return to normal activities tomorrow.                            Written discharge instructions were provided to the                            patient.                           - Resume previous diet.                           - Continue present medications.                           - Await pathology results.                           - The findings and recommendations were discussed                            with the patient.                           - Proceed with colonoscopy. Jackquline Denmark, MD 04/03/2021 3:17:51 PM This report has been signed electronically.

## 2021-04-03 NOTE — Progress Notes (Signed)
1430 HR > 100 with esmolol 25 mg given IV, MD updated, vss  

## 2021-04-03 NOTE — Progress Notes (Signed)
1343 Robinul 0.1 mg IV given due large amount of secretions upon assessment.  MD made aware, vss

## 2021-04-04 ENCOUNTER — Telehealth: Payer: Self-pay

## 2021-04-04 DIAGNOSIS — R1013 Epigastric pain: Secondary | ICD-10-CM

## 2021-04-04 DIAGNOSIS — C189 Malignant neoplasm of colon, unspecified: Secondary | ICD-10-CM

## 2021-04-04 DIAGNOSIS — R110 Nausea: Secondary | ICD-10-CM

## 2021-04-04 DIAGNOSIS — A048 Other specified bacterial intestinal infections: Secondary | ICD-10-CM

## 2021-04-04 LAB — CEA: CEA: 87.6 ng/mL — ABNORMAL HIGH

## 2021-04-04 NOTE — Telephone Encounter (Signed)
Received call from Dr. 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

## 2021-04-07 MED ORDER — DOXYCYCLINE MONOHYDRATE 100 MG PO TABS: 100.0000 mg | ORAL_TABLET | Freq: Two times a day (BID) | ORAL | 0 refills | Status: DC

## 2021-04-07 MED ORDER — METRONIDAZOLE 250 MG PO TABS: 250.0000 mg | ORAL_TABLET | Freq: Four times a day (QID) | ORAL | 0 refills | Status: DC

## 2021-04-07 MED ORDER — BISMUTH SUBSALICYLATE 262 MG PO CHEW: 524.0000 mg | CHEWABLE_TABLET | Freq: Four times a day (QID) | ORAL | 0 refills | Status: DC

## 2021-04-07 MED ORDER — OMEPRAZOLE 20 MG PO CPDR: 20.0000 mg | DELAYED_RELEASE_CAPSULE | Freq: Two times a day (BID) | ORAL | 6 refills | Status: DC

## 2021-04-07 NOTE — Telephone Encounter (Signed)
Spoke with patient. Medication is sent and will seee about CT and oncology in [place] per patient request.

## 2021-04-07 NOTE — Progress Notes (Signed)
Colon bx-adenocarcinoma  CEA 87, Hb 9.0  Gastric biopsies also positive for HP   Plan:  -CT chest Abdo/pelvis ASAP  -Oncology consultation  - for HP  1) Omeprazole 20 mg 2 times a day  2) Pepto Bismol 2 tabs (262 mg each) 4 times a day x 14 d  3) Metronidazole 250 mg 4 times a day x 14 d  4) doxycycline 100 mg 2 times a day x 14 d  4 weeks after treatment completed,can hold PPI x10 days in order tocheck H. Pylori stool antigen to confirm eradication (must be off acid suppression therapy).   RG    Sarah, No need for letter RG

## 2021-04-07 NOTE — Addendum Note (Signed)
Addended by: Curlene Labrum E on: 04/07/2021 05:05 PM   Modules accepted: Orders

## 2021-04-08 ENCOUNTER — Telehealth: Payer: Self-pay | Admitting: *Deleted

## 2021-04-08 NOTE — Telephone Encounter (Signed)
  Follow up Call-  Call back number 04/03/2021  Post procedure Call Back phone  # [phone], [phone]  Permission to leave phone message Yes  Some recent data might be hidden     Patient questions:  Do you have a fever, pain , or abdominal swelling? No. Pain Score  0 *  Have you tolerated food without any problems? Yes.    Have you been able to return to your normal activities? Yes.    Do you have any questions about your discharge instructions: Diet   No. Medications  No. Follow up visit  No.  Do you have questions or concerns about your Care? No. -pt spoke about medications that were prescribed. RN reviewed chart and noted that pt was positive for H.pylori and was prescribed treatment for that. RN reviewed medications and encouraged pt to get these medications ASAP and begin treatment. Pt states she plans to get them on Friday from her pharmacy. Pt states she spoke with the nurse yesterday who reviewed CT instructions and was going to mail the instructions to the pt. RN reiterated to pt to look for that information in the mail.   Actions: * If pain score is 4 or above: No action needed, pain <4.  Have you developed a fever since your procedure? no  2.   Have you had an respiratory symptoms (SOB or cough) since your procedure? no  3.   Have you tested positive for COVID 19 since your procedure no  4.   Have you had any family members/close contacts diagnosed with the COVID 19 since your procedure?  no   If yes to any of these questions please route to Joylene John, RN and Joella Prince, RN

## 2021-04-09 ENCOUNTER — Telehealth: Payer: Self-pay | Admitting: *Deleted

## 2021-04-09 NOTE — Telephone Encounter (Addendum)
You have [facility]  [place]  [id number]   You are scheduled on 04/18/2021 at [facility] should arrive 15 minutes prior to your appointment time for registration. Please follow the written instructions below on the day of your exam:  WARNING: IF YOU ARE ALLERGIC TO IODINE/X-RAY DYE, PLEASE NOTIFY RADIOLOGY IMMEDIATELY AT [phone]! YOU WILL BE GIVEN A 13 HOUR PREMEDICATION PREP.  1) Do not eat or drink anything after 7am (4 hours prior to your test) 2) You have been given 2 bottles of oral contrast to drink. The solution may taste better if refrigerated, but do NOT add ice or any other liquid to this solution. Shake well before drinking.    Drink 1 bottle of contrast @ 9am (2 hours prior to your exam)  Drink 1 bottle of contrast @ 10am (1 hour prior to your exam)  You may take any medications as prescribed with a small amount of water, if necessary. If you take any of the following medications: METFORMIN, GLUCOPHAGE, GLUCOVANCE, AVANDAMET, RIOMET, FORTAMET, [place] MET, JANUMET, GLUMETZA or METAGLIP, you MAY be asked to HOLD this medication 48 hours AFTER the exam.  The purpose of you drinking the oral contrast is to aid in the visualization of your intestinal tract. The contrast solution may cause some diarrhea. Depending on your individual set of symptoms, you may also receive an intravenous injection of x-ray contrast/dye. Plan on being at [facility] for 30 minutes or longer, depending on the type of exam you are having performed.  This test typically takes 30-45 minutes to complete.  If you have any questions regarding your exam or if you need to reschedule, you may call the CT department at [phone] between the hours of 8:00 am and 5:00 pm, Monday-Friday.  ________________________________________________________________________

## 2021-04-09 NOTE — Telephone Encounter (Deleted)
You have been scheduled for a CT scan of the abdomen and pelvis at [facility] ([address]---this is in the same building as [employer]).   You are scheduled on 04/18/2021 at 1030am. You should arrive 15 minutes prior to your appointment time for registration. Please follow the written instructions below on the day of your exam:  WARNING: IF YOU ARE ALLERGIC TO IODINE/X-RAY DYE, PLEASE NOTIFY RADIOLOGY IMMEDIATELY AT [phone]! YOU WILL BE GIVEN A 13 HOUR PREMEDICATION PREP.  1) Do not eat or drink anything after 630am (4 hours prior to your test) 2) You have been given 2 bottles of oral contrast to drink. The solution may taste better if refrigerated, but do NOT add ice or any other liquid to this solution. Shake well before drinking.    Drink 1 bottle of contrast @ 830am (2 hours prior to your exam)  Drink 1 bottle of contrast @ 930am (1 hour prior to your exam)  You may take any medications as prescribed with a small amount of water, if necessary. If you take any of the following medications: METFORMIN, GLUCOPHAGE, GLUCOVANCE, AVANDAMET, RIOMET, FORTAMET, [place] MET, JANUMET, GLUMETZA or METAGLIP, you MAY be asked to HOLD this medication 48 hours AFTER the exam.  The purpose of you drinking the oral contrast is to aid in the visualization of your intestinal tract. The contrast solution may cause some diarrhea. Depending on your individual set of symptoms, you may also receive an intravenous injection of x-ray contrast/dye. Plan on being at [facility] for 30 minutes or longer, depending on the type of exam you are having performed.  This test typically takes 30-45 minutes to complete.  If you have any questions regarding your exam or if you need to reschedule, you may call the CT department at [phone] between the hours of 8:00 am and 5:00 pm, Monday-Friday.  ________________________________________________________________________

## 2021-04-09 NOTE — Telephone Encounter (Signed)
Spoke with Manuela Schwartz at [place] [phone] and will give patient a call once chart has been review by the doctor

## 2021-04-09 NOTE — Telephone Encounter (Signed)
Patient made aware and says she understands.  I have left a detailed voicemail on her home phone and have mailed the information. I have asked her if she wanted to call her on Tuesday but she said no. But I told her to call with any questions please

## 2021-04-09 NOTE — Telephone Encounter (Signed)
Call from [facility] w/[facility] requesting new patient appointment with Dr. Benay Spice. Dx: colon cancer. Has staging scans on 04/18/21. Patient will need few day notice for appointment. Referral to MD desk for review.

## 2021-04-09 NOTE — Telephone Encounter (Signed)
LVM 3 times for patient to call back

## 2021-04-09 NOTE — Addendum Note (Signed)
Addended by: Curlene Labrum E on: 04/09/2021 10:51 AM   Modules accepted: Orders

## 2021-04-11 ENCOUNTER — Telehealth: Payer: Self-pay | Admitting: *Deleted

## 2021-04-11 NOTE — Telephone Encounter (Signed)
Patient returned call and RN explained that if CT is free of evidence of cancer she will go to surgeon first and see Dr. Benay Spice after surgery. She thought GI took out the tumor--explained to her they only did a biopsy of tumor, they are not surgeons. She inquired when to drink her contrast for the CT scan--instructed her to drink at 0900 and 1000 day of scan and to be NPO 4 hours prior. She will be notified after scan results w/appointment.

## 2021-04-11 NOTE — Telephone Encounter (Signed)
Per Dr. Benay Spice: Waiting on CT results to determine if she needs to be seen by oncology or surgery first. Left VM for patient to call office to inform her reason her appointment has not been scheduled yet. Mobile number not working.

## 2021-04-18 ENCOUNTER — Other Ambulatory Visit: Payer: Medicare HMO

## 2021-04-18 ENCOUNTER — Ambulatory Visit (HOSPITAL_BASED_OUTPATIENT_CLINIC_OR_DEPARTMENT_OTHER)
Admission: RE | Admit: 2021-04-18 | Discharge: 2021-04-18 | Disposition: A | Discharge status: Home / Self Care | Payer: Medicare HMO | Source: Ambulatory Visit | Attending: Gastroenterology | Admitting: Gastroenterology

## 2021-04-18 ENCOUNTER — Other Ambulatory Visit: Payer: Self-pay

## 2021-04-18 ENCOUNTER — Encounter (HOSPITAL_BASED_OUTPATIENT_CLINIC_OR_DEPARTMENT_OTHER): Payer: Self-pay

## 2021-04-18 DIAGNOSIS — C189 Malignant neoplasm of colon, unspecified: Secondary | ICD-10-CM

## 2021-04-18 DIAGNOSIS — R1013 Epigastric pain: Secondary | ICD-10-CM

## 2021-04-18 DIAGNOSIS — R110 Nausea: Secondary | ICD-10-CM

## 2021-04-18 DIAGNOSIS — A048 Other specified bacterial intestinal infections: Secondary | ICD-10-CM | POA: Diagnosis present

## 2021-04-18 MED ORDER — IOHEXOL 300 MG/ML  SOLN
75.0000 mL | Freq: Once | INTRAMUSCULAR | Status: AC | PRN
  Administered 2021-04-18: 75 mL via INTRAVENOUS

## 2021-04-21 ENCOUNTER — Telehealth: Payer: Self-pay | Admitting: Gastroenterology

## 2021-04-21 NOTE — Telephone Encounter (Signed)
Spoke with patient calling to inquire about CT results. Patient informed that Dr Lyndel Safe had been out of town the last week. Patient aware that once results have been read office will follow up with her.

## 2021-04-21 NOTE — Telephone Encounter (Signed)
Pt calling wants to know/discuss CT results.. Plz advise

## 2021-04-22 ENCOUNTER — Other Ambulatory Visit: Payer: Self-pay | Admitting: *Deleted

## 2021-04-23 ENCOUNTER — Telehealth: Payer: Self-pay | Admitting: *Deleted

## 2021-04-23 NOTE — Telephone Encounter (Signed)
Patient calling wanting to know who is supposed to contact her about seeing surgeon. Pt advised on referral process and that surgery will contact her to set up appt. Pt made aware that if she ahs not heard from surgery in 2 weeks to give our office a call back. Patient verbalized understanding and nothing further needed at this time.

## 2021-04-29 ENCOUNTER — Telehealth: Payer: Self-pay | Admitting: *Deleted

## 2021-04-29 NOTE — Telephone Encounter (Signed)
Patient returning call. Patient had question about surgeon's office appt. Informed patient that surgeon's office will be contacting her to set appt. Patient also advised that if she has not heard from surgeon's office to contact our office. Patient verbalized understanding and nothing further needed at this time.

## 2021-05-19 ENCOUNTER — Telehealth: Payer: Self-pay | Admitting: *Deleted

## 2021-05-19 NOTE — Telephone Encounter (Signed)
Called patient to f/u on her surgeon appointment on 06/04/21--inquired if this was the surgeon or her choice to delay the consult. She reports it was hers--had another appointment on 1st appointment she was given. She is still moving her bowels well. Suggested she call office to see if they will see her before 06/04/21. Asking RN what surgeon will do and if she will need a prep? Informed her the surgeon visit is just for consultation and to set up the surgery. She will be prescribed a prep and antibiotics for the surgery. She agrees to call. Also reports she finished her Flagyl taking 4/day for 14 days and says she has pills left over and asking what to do. Instructed her to call Dr. Lyndel Safe with that question.

## 2021-05-27 ENCOUNTER — Telehealth: Payer: Self-pay

## 2021-05-27 NOTE — Telephone Encounter (Signed)
   [facility] Pre-operative Risk Assessment    Patient Name: Kelsey Bridges  DOB: [birth year] MRN: [id number]  HEARTCARE STAFF:  - IMPORTANT!!!!!! Under Visit Info/Reason for Call, type in Other and utilize the format Clearance MM/DD/YY or Clearance TBD. Do not use dashes or single digits. - Please review there is not already an duplicate clearance open for this procedure. - If request is for dental extraction, please clarify the # of teeth to be extracted. - If the patient is currently at the dentist's office, call Pre-Op Callback Staff (MA/nurse) to input urgent request.  - If the patient is not currently in the dentist office, please route to the Pre-Op pool.  Request for surgical clearance:  What type of surgery is being performed? ROBOTIC LEFT HEMICOLECTOMY AND FLEXIBLE SIGMOIDOSCOPY   When is this surgery scheduled? TBD  What type of clearance is required (medical clearance vs. Pharmacy clearance to hold med vs. Both)? BOTH  Are there any medications that need to be held prior to surgery and how long? [place] name and name of physician performing surgery? [facility] CHRISTOPHER WHITE, MD ATTN:KELSEY  What is the office phone number? [phone]   7.   What is the office fax number? [phone]  8.   Anesthesia type (None, local, MAC, general) ? GENERAL   Waylan Rocher 05/27/2021, 4:55 PM  _________________________________________________________________   (provider comments below)

## 2021-05-28 ENCOUNTER — Telehealth: Payer: Self-pay | Admitting: Gastroenterology

## 2021-05-28 NOTE — Telephone Encounter (Signed)
Pt has questions about colonoscopy that she had done with Dr. Lyndel Safe. Pls call her.

## 2021-05-28 NOTE — Telephone Encounter (Signed)
Attempted to return patients call x3, line rings then goes busy. No option for VM, will attempt to call patient later.

## 2021-05-29 NOTE — Telephone Encounter (Signed)
Patient with diagnosis of atrial fibrillation on Eliquis for anticoagulation.    Procedure: robotic hemicolectomy and flexible sigmoidoscopy Date of procedure: TBD   CHA2DS2-VASc Score = 3  This indicates a 3.2% annual risk of stroke. The patient's score is based upon: CHF History: No HTN History: Yes Diabetes History: No Stroke History: No Vascular Disease History: No Age Score: 1 Gender Score: 1    CrCl 55 (with adjusted body weight) Platelet count 357  Per office protocol, patient can hold Eliquis for 2 days prior to procedure.   Patient will not need bridging with Lovenox (enoxaparin) around procedure.

## 2021-05-29 NOTE — Telephone Encounter (Signed)
Attempted to call patient. Patients phone rings then goes busy. Have attempted to contact patient four times, will close encounter at this time.

## 2021-05-30 NOTE — Telephone Encounter (Signed)
Primary Cardiologist:Jonathan Gwenlyn Found, MD  Chart reviewed as part of pre-operative protocol coverage. Because of Kelsey Bridges past medical history and time since last visit, he/she will require a follow-up visit in order to better assess preoperative cardiovascular risk.  Pre-op covering staff: - Please schedule appointment and call patient to inform them. - Please contact requesting surgeon's office via preferred method (i.e, phone, fax) to inform them of need for appointment prior to surgery.  If applicable, this message will also be routed to pharmacy pool and/or primary cardiologist for input on holding anticoagulant/antiplatelet agent as requested below so that this information is available at time of patient's appointment.   Deberah Pelton, NP  05/30/2021, 7:42 AM

## 2021-05-30 NOTE — Telephone Encounter (Signed)
Pt has appt with Dr Gwenlyn Found 8-24 @ 1030am and appt for ECHO 8-25 @ 1035am. Called pt and she states that she did not know about the ECHO appt. Informed pt that Dr Gwenlyn Found may need the ECHO done for her cardiac clearance. She states that she may have to cancel the ECHO she is unsure that she can get transportation. Informed pt that we do have transportation services available. Pt would like a call back later to discuss as I woke her up and she is "foggy" headed. Will CB later to discuss.

## 2021-06-03 NOTE — Telephone Encounter (Signed)
I tried to call the Kelsey Bridges per the previous message. Recording came on "call cannot be completed". Kelsey Bridges has appt with Dr. Gwenlyn Found tomorrow 06/04/21, see notes from Veronda Prude, LPN in regards to echo. If Kelsey Bridges needs echo for pre op clearance she may have to reschedule to when she will have transportation, though this will delay he pre op clearance. She will have to discuss this with Dr. Gwenlyn Found further. I will send FYI to surgeon's office of upcoming appt and may need echo for pre op clearance.

## 2021-06-04 ENCOUNTER — Ambulatory Visit (INDEPENDENT_AMBULATORY_CARE_PROVIDER_SITE_OTHER): Payer: Medicare HMO | Admitting: Cardiovascular Disease

## 2021-06-04 ENCOUNTER — Encounter: Payer: Self-pay | Admitting: Cardiovascular Disease

## 2021-06-04 ENCOUNTER — Other Ambulatory Visit: Payer: Self-pay

## 2021-06-04 VITALS — BP 144/76 | HR 78 | Ht 61.0 in | Wt 128.0 lb

## 2021-06-04 DIAGNOSIS — I480 Paroxysmal atrial fibrillation: Secondary | ICD-10-CM | POA: Diagnosis not present

## 2021-06-04 DIAGNOSIS — E782 Mixed hyperlipidemia: Secondary | ICD-10-CM

## 2021-06-04 DIAGNOSIS — Z720 Tobacco use: Secondary | ICD-10-CM

## 2021-06-04 DIAGNOSIS — I10 Essential (primary) hypertension: Secondary | ICD-10-CM | POA: Diagnosis not present

## 2021-06-04 DIAGNOSIS — I340 Nonrheumatic mitral (valve) insufficiency: Secondary | ICD-10-CM

## 2021-06-04 NOTE — Assessment & Plan Note (Signed)
History of ongoing tobacco abuse a pack cigarettes a day.

## 2021-06-04 NOTE — Assessment & Plan Note (Signed)
History of hyperlipidemia not on statin therapy with lipid profile performed 12/09/2020 revealing total cholesterol of 166, LDL of 99 and HDL 54.

## 2021-06-04 NOTE — Assessment & Plan Note (Signed)
History of mild to moderate MR by 2D echocardiogram performed 06/28/2020.  She had normal LV size and function.  She is scheduled to have this repeated tomorrow.

## 2021-06-04 NOTE — Assessment & Plan Note (Signed)
History of PAF maintaining sinus rhythm on Eliquis oral anticoagulation. 

## 2021-06-04 NOTE — Progress Notes (Signed)
06/04/2021 Kelsey Bridges   1951/02/17  [id number]  Primary Physician Delford Field, FNP Primary Cardiologist: Lorretta Harp MD Lupe Carney, [place]  HPI:  Kelsey Bridges is a 70 y.o.  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  She was referred by Dr. Lavonia Drafts for cardiovascular evaluation because of recently recognized PAF.  I last saw her in the office 05/15/2020.  She does have a history of treated hypertension.  There is no family history for heart disease.  She is never had a heart doctor stroke.    She really did not know she had atrial fibrillation.  She was started on on Eliquis oral anticoagulation.  She had an echocardiogram performed 06/28/2020 revealing normal LV size and function with mild to moderate MR.  She denies chest pain or shortness of breath.  She apparently does need colon surgery in the near future for what sounds like a partial colectomy for abnormality seen on colonoscopy.  I believe that she is at low risk to have this done given her lack of symptoms and normal LV function.     Current Meds  Medication Sig   bismuth subsalicylate (PEPTO-BISMOL) 262 MG chewable tablet Chew 2 tablets (524 mg total) by mouth QID. For 14 days   diltiazem (CARDIZEM CD) 240 MG 24 hr capsule Take 1 capsule (240 mg total) by mouth daily.   doxycycline (ADOXA) 100 MG tablet Take 1 tablet (100 mg total) by mouth 2 (two) times daily. For 14 days   ELIQUIS 5 MG TABS tablet Take 1 tablet (5 mg total) by mouth 2 (two) times daily.   ibuprofen (ADVIL) 800 MG tablet    losartan (COZAAR) 100 MG tablet Take 1 tablet (100 mg total) by mouth daily.   metroNIDAZOLE (FLAGYL) 250 MG tablet Take 1 tablet (250 mg total) by mouth 4 (four) times daily. For 14 days   omeprazole (PRILOSEC) 20 MG capsule Take 1 capsule (20 mg total) by mouth 2 (two) times daily.   sertraline  (ZOLOFT) 100 MG tablet Take 100 mg by mouth daily.   traZODone (DESYREL) 50 MG tablet      Allergies  Allergen Reactions   Penicillins     Did it involve swelling of the face/tongue/throat, SOB, or low BP? No Did it involve sudden or severe rash/hives, skin peeling, or any reaction on the inside of your mouth or nose? Yes Did you need to seek medical attention at a hospital or doctor's office? No When did it last happen?   More than 10 If all above answers are "NO", may proceed with cephalosporin use.  Pt states "she can take the tablet but not the injection"    Social History   Socioeconomic History   Marital status: Widowed    Spouse name: Not on file   Number of children: Not on file   Years of education: Not on file   Highest education level: Not on file  Occupational History   Not on file  Tobacco Use   Smoking status: Every Day    Packs/day: 0.20    Types: Cigarettes   Smokeless tobacco: Current    Types: Snuff  Vaping Use   Vaping Use: Never used  Substance and Sexual Activity   Alcohol use: Yes    Comment: "at the holidays maybe"   Drug use: No   Sexual activity: Not  Currently    Birth control/protection: Surgical  Other Topics Concern   Not on file  Social History Narrative   Not on file   Social Determinants of Health   Financial Resource Strain: Not on file  Food Insecurity: Not on file  Transportation Needs: Not on file  Physical Activity: Not on file  Stress: Not on file  Social Connections: Not on file  Intimate Partner Violence: Not on file     Review of Systems: General: negative for chills, fever, night sweats or weight changes.  Cardiovascular: negative for chest pain, dyspnea on exertion, edema, orthopnea, palpitations, paroxysmal nocturnal dyspnea or shortness of breath Dermatological: negative for rash Respiratory: negative for cough or wheezing Urologic: negative for hematuria Abdominal: negative for nausea, vomiting, diarrhea,  bright red blood per rectum, melena, or hematemesis Neurologic: negative for visual changes, syncope, or dizziness All other systems reviewed and are otherwise negative except as noted above.    Blood pressure (!) 144/76, pulse 78, height '5\' 1"'$  (1.549 m), weight 128 lb (58.1 kg), SpO2 98 %.  General appearance: alert and no distress Neck: no adenopathy, no carotid bruit, no JVD, supple, symmetrical, trachea midline, and thyroid not enlarged, symmetric, no tenderness/mass/nodules Lungs: clear to auscultation bilaterally Heart: regular rate and rhythm, S1, S2 normal, no murmur, click, rub or gallop Extremities: extremities normal, atraumatic, no cyanosis or edema Pulses: 2+ and symmetric Skin: Skin color, texture, turgor normal. No rashes or lesions Neurologic: Grossly normal  EKG sinus rhythm at 71 without ST or T wave changes.  I personally reviewed this EKG.  ASSESSMENT AND PLAN:   Essential hypertension History of essential hypertension blood pressure measured today at 144/76.  She is on diltiazem.  Paroxysmal atrial fibrillation (HCC) History of PAF maintaining sinus rhythm on Eliquis oral anticoagulation.  Hyperlipidemia History of hyperlipidemia not on statin therapy with lipid profile performed 12/09/2020 revealing total cholesterol of 166, LDL of 99 and HDL 54.  Tobacco abuse History of ongoing tobacco abuse a pack cigarettes a day.  Mitral regurgitation History of mild to moderate MR by 2D echocardiogram performed 06/28/2020.  She had normal LV size and function.  She is scheduled to have this repeated tomorrow.     Lorretta Harp MD FACP,FACC,FAHA, [facility] 06/04/2021 11:59 AM

## 2021-06-04 NOTE — Patient Instructions (Signed)
Medication Instructions:  You may stop your eliquis 2 days before your upcoming procedure.   *If you need a refill on your cardiac medications before your next appointment, please call your pharmacy*   Follow-Up: At [facility], you and your health needs are our priority.  As part of our continuing mission to provide you with exceptional heart care, we have created designated Provider Care Teams.  These Care Teams include your primary Cardiologist (physician) and Advanced Practice Providers (APPs -  Physician Assistants and Nurse Practitioners) who all work together to provide you with the care you need, when you need it.  We recommend signing up for the patient portal called "MyChart".  Sign up information is provided on this After Visit Summary.  MyChart is used to connect with patients for Virtual Visits (Telemedicine).  Patients are able to view lab/test results, encounter notes, upcoming appointments, etc.  Non-urgent messages can be sent to your provider as well.   To learn more about what you can do with MyChart, go to [email or web address].    Your next appointment:   6 month(s)  The format for your next appointment:   In Person  Provider:   You will see one of the following Advanced Practice Providers on your designated Care Team:   Sande Rives, PA-C Coletta Memos, FNP  Then, Quay Burow, MD will plan to see you again in 12 month(s).

## 2021-06-04 NOTE — Assessment & Plan Note (Signed)
History of essential hypertension blood pressure measured today at 144/76.  She is on diltiazem.

## 2021-06-05 ENCOUNTER — Other Ambulatory Visit (HOSPITAL_COMMUNITY): Payer: Medicare HMO

## 2021-06-12 ENCOUNTER — Telehealth: Payer: Self-pay | Admitting: Gastroenterology

## 2021-06-12 NOTE — Telephone Encounter (Signed)
Inbound call from patient requesting a call back. States she is having complications with her stomach before her upcoming surgery.   Best contact number [phone]

## 2021-06-12 NOTE — Telephone Encounter (Signed)
Spoke with patient, patient states she will call office back as she was busy

## 2021-06-15 DIAGNOSIS — M1711 Unilateral primary osteoarthritis, right knee: Secondary | ICD-10-CM | POA: Insufficient documentation

## 2021-06-18 NOTE — Telephone Encounter (Signed)
Attempted to contact pt at number provided. Phone continues to ring no VM. Will try again later

## 2021-06-19 ENCOUNTER — Encounter: Payer: Self-pay | Admitting: *Deleted

## 2021-06-19 NOTE — Progress Notes (Signed)
Spoke briefly with Kelsey Bridges that she is having an ECHO performed tomorrow for surgical clearance on 9/9.  No surgery date yet

## 2021-06-20 ENCOUNTER — Ambulatory Visit (HOSPITAL_COMMUNITY): Payer: Medicare HMO | Attending: Internal Medicine

## 2021-06-20 ENCOUNTER — Other Ambulatory Visit: Payer: Self-pay

## 2021-06-20 DIAGNOSIS — I480 Paroxysmal atrial fibrillation: Secondary | ICD-10-CM | POA: Insufficient documentation

## 2021-06-20 DIAGNOSIS — R931 Abnormal findings on diagnostic imaging of heart and coronary circulation: Secondary | ICD-10-CM

## 2021-06-20 LAB — ECHOCARDIOGRAM COMPLETE
Area-P 1/2: 3.96 cm2
S' Lateral: 2.8 cm

## 2021-06-20 NOTE — Telephone Encounter (Signed)
Attempted to contact patient, phone rings no answer no VM. Will close encounter at this time

## 2021-06-23 ENCOUNTER — Other Ambulatory Visit: Payer: Self-pay | Admitting: *Deleted

## 2021-06-23 DIAGNOSIS — I340 Nonrheumatic mitral (valve) insufficiency: Secondary | ICD-10-CM

## 2021-06-27 NOTE — Telephone Encounter (Signed)
   Name: TAIJAH COWINS  DOB: 02/28/1951  MRN: [id number]   Primary Cardiologist: Quay Burow, MD  Chart reviewed as part of pre-operative protocol coverage. Patient was contacted 06/27/2021 in reference to pre-operative risk assessment for pending surgery as outlined below.  RICHIE GARRELL was last seen on 06/04/2021 by Dr. Gwenlyn Found after a previous request for clearance and felt to be doing well without any chest pain or shortness of breath.  Per Dr. Gwenlyn Found documentation: "I believe that she is at low risk to have this done given her lack of symptoms and normal LV function." Dr. Gwenlyn Found was contacted after this visit regarding an echo and has confirmed that she does not require an echo prior to her surgery. Therefore, based on ACC/AHA guidelines, the patient would be at acceptable risk for the planned procedure without further cardiovascular testing.   Pharmacy was contacted 05/29/2021 and has recommended patient hold Eliquis for 2 days prior to the procedure.  Will contact surgeon via [place] and remove from pool.   Arvil Chaco, PA-C 06/27/2021, 4:12 PM

## 2021-06-27 NOTE — Telephone Encounter (Signed)
Claiborne Billings from [facility] calling to follow up on clearance.

## 2021-06-30 ENCOUNTER — Telehealth: Payer: Self-pay | Admitting: *Deleted

## 2021-06-30 NOTE — Telephone Encounter (Signed)
   [facility] Pre-operative Risk Assessment    Patient Name: Kelsey Bridges  DOB: February 05, 1951 MRN: [id number]    Request for surgical clearance:  What type of surgery is being performed? Robotic Left Hemicolectomy and Flexible Sigmoidoscopy   When is this surgery scheduled? 106/22  What type of clearance is required (medical clearance vs. Pharmacy clearance to hold med vs. Both)? Both   Are there any medications that need to be held prior to surgery and how long? Eliquis  Practice name and name of physician performing surgery? [facility] ; Dr Nadeen Landau   What is the office phone number? [phone]   7.   What is the office fax number? [phone]  8.   Anesthesia type (None, local, MAC, general) ? general   Raiford Simmonds 06/30/2021, 12:30 PM  _________________________________________________________________   (provider comments below)

## 2021-06-30 NOTE — Progress Notes (Signed)
Requested orders with Dr. Orest Dikes office.

## 2021-07-01 NOTE — Telephone Encounter (Signed)
Patient with diagnosis of atrial fibrillation on Eliquis for anticoagulation.    Procedure: robotic left hemicolectomy and flexible sigmoidoscopy Date of procedure: 07/17/21   CHA2DS2-VASc Score = 3   This indicates a 3.2% annual risk of stroke. The patient's score is based upon: CHF History: 0 HTN History: 1 Diabetes History: 0 Stroke History: 0 Vascular Disease History: 0 Age Score: 1 Gender Score: 1    CrCl 58 Platelet count 357  Per office protocol, patient can hold Eliquis for 3 days prior to procedure.   Patient will not need bridging with Lovenox (enoxaparin) around procedure.  Patient should restart Eliquis on the evening of procedure or day after, at discretion of procedure MD

## 2021-07-02 ENCOUNTER — Other Ambulatory Visit: Payer: Self-pay | Admitting: *Deleted

## 2021-07-02 NOTE — Progress Notes (Unsigned)
am

## 2021-07-02 NOTE — Telephone Encounter (Signed)
   Patient Name: Kelsey Bridges  DOB: 29-May-1951 MRN: [id number]  Primary Cardiologist: Quay Burow, MD  Chart reviewed as part of pre-operative protocol coverage. Patient was recently seen by Dr. Gwenlyn Found in clinic at which time he referenced pre-op clearance. Per his note, "She apparently does need colon surgery in the near future for what sounds like a partial colectomy for abnormality seen on colonoscopy.  I believe that she is at low risk to have this done given her lack of symptoms and normal LV function." 2D Echo 06/2021 unchanged felt unchanged from prior.  Pharmacist has given recommendations for anticoagulation: "Per office protocol, patient can hold Eliquis for 3 days prior to procedure. Patient will not need bridging with Lovenox (enoxaparin) around procedure. Patient should restart Eliquis on the evening of procedure or day after, at discretion of procedure MD"  Will route this bundled recommendation and Dr. Kennon Holter note to requesting provider via Epic fax function. Please call with questions.  Charlie Pitter, PA-C 07/02/2021, 2:40 PM

## 2021-07-03 ENCOUNTER — Ambulatory Visit: Payer: Self-pay | Admitting: Surgery

## 2021-07-04 ENCOUNTER — Telehealth: Payer: Self-pay | Admitting: Oncology

## 2021-07-04 NOTE — Patient Instructions (Addendum)
DUE TO COVID-19 ONLY ONE VISITOR IS ALLOWED TO COME WITH YOU AND STAY IN THE WAITING ROOM ONLY DURING PRE OP AND PROCEDURE DAY OF SURGERY IF YOU ARE GOING HOME AFTER SURGERY. IF YOU ARE SPENDING THE NIGHT 2 PEOPLE MAY VISIT WITH YOU IN YOUR PRIVATE ROOM AFTER SURGERY UNTIL VISITING  HOURS ARE OVER AT 8:00 PM AND 1 VISITOR CAN SPEND THE NIGHT.   YOU NEED TO HAVE A COVID 19 TEST ON__10/4___THIS TEST MUST BE DONE BEFORE SURGERY,  COVID TESTING SITE  IS LOCATED AT [place], [place]. REMAIN IN YOUR CAR THIS IS A DRIVE UP TEST. AFTER YOUR COVID TEST PLEASE WEAR A MASK OUT IN PUBLIC AND SOCIAL DISTANCE AND Kelsey Bridges YOUR HANDS FREQUENTLY, ALSO ASK ALL YOUR CLOSE CONTACT PERSONS TO WEAR A MASK AND SOCIAL DISTANCE AND Kelsey Bridges THEIR HANDS FREQUENTLY ALSO.               Kelsey Bridges     Your procedure is scheduled on: 07/17/21   Report to [facility] Main  Entrance   Report to admitting at   8:45 AM     Call this number if you have problems the morning of surgery [phone]   Follow the bowel prep instructions  from the Dr's office.   Day before surgery have a light diet but drink plenty of liquids to prevent dehydration   DRINK 2 PRESURGERY ENSURE DRINKS THE NIGHT BEFORE SURGERY AT 10:00 PM    NO SOLIDS AFTER MIDNIGHT THE DAY PRIOR TO THE SURGERY.   NOTHING BY MOUTH EXCEPT CLEAR LIQUIDS UNTIL 8:00 am. Drink the 3rd ensure at 8:00 am  CLEAR LIQUID DIET   Foods Allowed                                                                     Foods Excluded  Coffee and tea, regular and decaf                          No Milk or creamer.   liquids that you cannot  Plain Jell-O any favor except red or purple                                           see through such as: Fruit ices (not with fruit pulp)                                     milk, soups, orange juice  Iced Popsicles                                    All solid food Carbonated beverages, regular and diet                                     Cranberry, grape and apple juices Sports drinks like Gatorade Lightly seasoned clear  broth or consume(fat free) Sugar    BRUSH YOUR TEETH MORNING OF SURGERY AND RINSE YOUR MOUTH OUT, NO CHEWING GUM CANDY OR MINTS.     Take these medicines the morning of surgery with A SIP OF WATER: Zoloft,( Sertraline),  Diltiazem, Omeprazole.(Prilosec)   Use your inhaler and bring it with you        Stop taking _eliquis__________on _10/3_________as instructed by _Dr.  White____________.                              You may not have any metal on your body including hair pins and              piercings  Do not wear jewelry, make-up, lotions, powders or perfumes, deodorant             Do not wear nail polish on your finger and toesnails.  Do not shave  48 hours prior to surgery.            Do not bring valuables to the hospital. Kelsey Bridges.  Contacts, dentures or bridgework may not be worn into surgery.  Leave suitcase in the car. After surgery it may be brought to your room. Special Instructions: N/A              Please read over the following fact sheets you were given: _____________________________________________________________________             [facility] - Preparing for Surgery Before surgery, you can play an important role.  Because skin is not sterile, your skin needs to be as free of germs as possible.  You can reduce the number of germs on your skin by washing with CHG (chlorahexidine gluconate) soap before surgery.  CHG is an antiseptic cleaner which kills germs and bonds with the skin to continue killing germs even after washing. Please DO NOT use if you have an allergy to CHG or antibacterial soaps.  If your skin becomes reddened/irritated stop using the CHG and inform your nurse when you arrive at Short Stay. Do not shave (including legs and underarms) for at least 48 hours prior to the first CHG shower.     Please follow these instructions carefully:  1.  Shower with CHG Soap the night before surgery and the  morning of Surgery.  2.  If you choose to wash your hair, wash your hair first as usual with your  normal  shampoo.  3.  After you shampoo, rinse your hair and body thoroughly to remove the  shampoo.                            4.  Use CHG as you would any other liquid soap.  You can apply chg directly  to the skin and wash                       Gently with a scrungie or clean washcloth.  5.  Apply the CHG Soap to your body ONLY FROM THE NECK DOWN.   Do not use on face/ open                           Wound or open sores. Avoid contact with eyes,  ears mouth and genitals (private parts).                       Wash face,  Genitals (private parts) with your normal soap.             6.  Wash thoroughly, paying special attention to the area where your surgery  will be performed.  7.  Thoroughly rinse your body with warm water from the neck down.  8.  DO NOT shower/wash with your normal soap after using and rinsing off  the CHG Soap.                9.  Pat yourself dry with a clean towel.            10.  Wear clean pajamas.            11.  Place clean sheets on your bed the night of your first shower and do not  sleep with pets. Day of Surgery : Do not apply any lotions/deodorants the morning of surgery.  Please wear clean clothes to the hospital/surgery center.  FAILURE TO FOLLOW THESE INSTRUCTIONS MAY RESULT IN THE CANCELLATION OF YOUR SURGERY PATIENT SIGNATURE_________________________________  NURSE SIGNATURE__________________________________  ________________________________________________________________________   Kelsey Bridges  An incentive spirometer is a tool that can help keep your lungs clear and active. This tool measures how well you are filling your lungs with each breath. Taking long deep breaths may help reverse or decrease the chance of developing breathing  (pulmonary) problems (especially infection) following: A long period of time when you are unable to move or be active. BEFORE THE PROCEDURE  If the spirometer includes an indicator to show your best effort, your nurse or respiratory therapist will set it to a desired goal. If possible, sit up straight or lean slightly forward. Try not to slouch. Hold the incentive spirometer in an upright position. INSTRUCTIONS FOR USE  Sit on the edge of your bed if possible, or sit up as far as you can in bed or on a chair. Hold the incentive spirometer in an upright position. Breathe out normally. Place the mouthpiece in your mouth and seal your lips tightly around it. Breathe in slowly and as deeply as possible, raising the piston or the ball toward the top of the column. Hold your breath for 3-5 seconds or for as long as possible. Allow the piston or ball to fall to the bottom of the column. Remove the mouthpiece from your mouth and breathe out normally. Rest for a few seconds and repeat Steps 1 through 7 at least 10 times every 1-2 hours when you are awake. Take your time and take a few normal breaths between deep breaths. The spirometer may include an indicator to show your best effort. Use the indicator as a goal to work toward during each repetition. After each set of 10 deep breaths, practice coughing to be sure your lungs are clear. If you have an incision (the cut made at the time of surgery), support your incision when coughing by placing a pillow or rolled up towels firmly against it. Once you are able to get out of bed, walk around indoors and cough well. You may stop using the incentive spirometer when instructed by your caregiver.  RISKS AND COMPLICATIONS Take your time so you do not get dizzy or light-headed. If you are in pain, you may need to take or ask for pain medication before doing incentive spirometry. It  is harder to take a deep breath if you are having pain. AFTER USE Rest and  breathe slowly and easily. It can be helpful to keep track of a log of your progress. Your caregiver can provide you with a simple table to help with this. If you are using the spirometer at home, follow these instructions: [place] IF:  You are having difficultly using the spirometer. You have trouble using the spirometer as often as instructed. Your pain medication is not giving enough relief while using the spirometer. You develop fever of 100.5 F (38.1 C) or higher. SEEK IMMEDIATE MEDICAL CARE IF:  You cough up bloody sputum that had not been present before. You develop fever of 102 F (38.9 C) or greater. You develop worsening pain at or near the incision site. MAKE SURE YOU:  Understand these instructions. Will watch your condition. Will get help right away if you are not doing well or get worse. Document Released: 02/08/2007 Document Revised: 12/21/2011 Document Reviewed: 04/11/2007 [facility] Patient Information 2014 [place], [place].   ________________________________________________________________________

## 2021-07-04 NOTE — Telephone Encounter (Signed)
Rescheduled new patient appointment on 08/01/21 to 10/24 due to provider PAL - called patient and they are aware of appt date and time of new appt.

## 2021-07-07 ENCOUNTER — Encounter (HOSPITAL_COMMUNITY)
Admission: RE | Admit: 2021-07-07 | Discharge: 2021-07-07 | Disposition: A | Discharge status: Home / Self Care | Payer: Medicare HMO | Source: Ambulatory Visit | Attending: Surgery | Admitting: Surgery

## 2021-07-07 ENCOUNTER — Encounter (HOSPITAL_COMMUNITY): Payer: Self-pay

## 2021-07-07 ENCOUNTER — Other Ambulatory Visit: Payer: Self-pay

## 2021-07-07 DIAGNOSIS — I4891 Unspecified atrial fibrillation: Secondary | ICD-10-CM | POA: Diagnosis not present

## 2021-07-07 DIAGNOSIS — F17210 Nicotine dependence, cigarettes, uncomplicated: Secondary | ICD-10-CM | POA: Diagnosis not present

## 2021-07-07 DIAGNOSIS — C189 Malignant neoplasm of colon, unspecified: Secondary | ICD-10-CM | POA: Diagnosis not present

## 2021-07-07 DIAGNOSIS — I10 Essential (primary) hypertension: Secondary | ICD-10-CM | POA: Insufficient documentation

## 2021-07-07 DIAGNOSIS — Z79899 Other long term (current) drug therapy: Secondary | ICD-10-CM | POA: Diagnosis not present

## 2021-07-07 DIAGNOSIS — Z791 Long term (current) use of non-steroidal anti-inflammatories (NSAID): Secondary | ICD-10-CM | POA: Diagnosis not present

## 2021-07-07 DIAGNOSIS — Z01812 Encounter for preprocedural laboratory examination: Secondary | ICD-10-CM | POA: Diagnosis not present

## 2021-07-07 DIAGNOSIS — Z7901 Long term (current) use of anticoagulants: Secondary | ICD-10-CM | POA: Insufficient documentation

## 2021-07-07 HISTORY — DX: Cardiac arrhythmia, unspecified: I49.9

## 2021-07-07 LAB — COMPREHENSIVE METABOLIC PANEL
ALT: 9 U/L (ref 0–44)
AST: 12 U/L — ABNORMAL LOW (ref 15–41)
Albumin: 3.6 g/dL (ref 3.5–5.0)
Alkaline Phosphatase: 66 U/L (ref 38–126)
Anion gap: 8 (ref 5–15)
BUN: 12 mg/dL (ref 8–23)
CO2: 25 mmol/L (ref 22–32)
Calcium: 9.9 mg/dL (ref 8.9–10.3)
Chloride: 113 mmol/L — ABNORMAL HIGH (ref 98–111)
Creatinine, Ser: 0.99 mg/dL (ref 0.44–1.00)
GFR, Estimated: >60 mL/min (ref >60)
Glucose, Bld: 120 mg/dL — ABNORMAL HIGH (ref 70–99)
Potassium: 4.9 mmol/L (ref 3.5–5.1)
Sodium: 146 mmol/L — ABNORMAL HIGH (ref 135–145)
Total Bilirubin: 0.5 mg/dL (ref 0.3–1.2)
Total Protein: 7.6 g/dL (ref 6.5–8.1)

## 2021-07-07 LAB — CBC WITH DIFFERENTIAL/PLATELET
Abs Immature Granulocytes: 0.02 10*3/uL (ref 0.00–0.07)
Basophils Absolute: 0 10*3/uL (ref 0.0–0.1)
Basophils Relative: 1 %
Eosinophils Absolute: 0.1 10*3/uL (ref 0.0–0.5)
Eosinophils Relative: 1 %
HCT: 32.4 % — ABNORMAL LOW (ref 36.0–46.0)
Hemoglobin: 10.1 g/dL — ABNORMAL LOW (ref 12.0–15.0)
Immature Granulocytes: 0 %
Lymphocytes Relative: 16 %
Lymphs Abs: 1.4 10*3/uL (ref 0.7–4.0)
MCH: 25.4 pg — ABNORMAL LOW (ref 26.0–34.0)
MCHC: 31.2 g/dL (ref 30.0–36.0)
MCV: 81.4 fL (ref 80.0–100.0)
Monocytes Absolute: 0.7 10*3/uL (ref 0.1–1.0)
Monocytes Relative: 8 %
Neutro Abs: 6.6 10*3/uL (ref 1.7–7.7)
Neutrophils Relative %: 74 %
Platelets: 308 10*3/uL (ref 150–400)
RBC: 3.98 MIL/uL (ref 3.87–5.11)
RDW: 21.9 % — ABNORMAL HIGH (ref 11.5–15.5)
WBC: 8.8 10*3/uL (ref 4.0–10.5)
nRBC: 0 % (ref 0.0–0.2)

## 2021-07-07 LAB — PROTIME-INR
INR: 1.4 — ABNORMAL HIGH (ref 0.8–1.2)
Prothrombin Time: 17.1 seconds — ABNORMAL HIGH (ref 11.4–15.2)

## 2021-07-07 NOTE — Progress Notes (Signed)
COVID 10/4  PCP - Dr. Imogene Burn williams Cardiologist - Dr. Adora Fridge LOV 06/04/21, clearance 06/30/21  Chest x-ray - no EKG - 06/04/21-epic Stress Test - no ECHO - 06/20/21-epic Cardiac Cath - no Pacemaker/ICD device last checked:NA  Sleep Study - no CPAP -   Fasting Blood Sugar - NA Checks Blood Sugar _____ times a day  Blood Thinner Instructions:Eliquis/ Dr. Gwenlyn Found Aspirin Instructions:Stop 2 days prior to DOS/ Dr. Dema Severin Last Dose:10/3  Anesthesia review: yes  Patient denies shortness of breath, fever, cough and chest pain at PAT appointment Pt has no SOB climbing stairs ,doing housework or with ADLs.  Patient verbalized understanding of instructions that were given to them at the PAT appointment. Patient was also instructed that they will need to review over the PAT instructions again at home before surgery. Yes. Pharmacy tech reviewed Pt's meds with her. Discussion was about keeping meds in their bottles until used up and not loading multy days doses together. Also Zoloft is a medication that is not an as needed medication and to discuss it with her Dr.  I went over her instructions along with her bowel prep to help her keep it all straight.

## 2021-07-08 LAB — HEMOGLOBIN A1C
Hgb A1c MFr Bld: 5.6 % (ref 4.8–5.6)
Mean Plasma Glucose: 114.02 mg/dL

## 2021-07-10 NOTE — Anesthesia Preprocedure Evaluation (Addendum)
Anesthesia Evaluation  Patient identified by MRN, date of birth, ID band Patient awake    Reviewed: Allergy & Precautions, NPO status , Patient's Chart, lab work & pertinent test results  Airway Mallampati: II  TM Distance: >3 FB     Dental  (+) Teeth Intact   Pulmonary neg pulmonary ROS, Current Smoker,    Pulmonary exam normal        Cardiovascular hypertension, Pt. on medications + dysrhythmias Atrial Fibrillation  Rhythm:Irregular Rate:Normal     Neuro/Psych negative neurological ROS  negative psych ROS   GI/Hepatic Neg liver ROS, GERD  Medicated,Colon cancer   Endo/Other  negative endocrine ROS  Renal/GU negative Renal ROS  negative genitourinary   Musculoskeletal negative musculoskeletal ROS (+)   Abdominal (+)  Abdomen: soft.    Peds  Hematology negative hematology ROS (+)   Anesthesia Other Findings   Reproductive/Obstetrics                           Anesthesia Physical Anesthesia Plan  ASA: 3  Anesthesia Plan: General   Post-op Pain Management:    Induction: Intravenous  PONV Risk Score and Plan: 2 and Ondansetron, Dexamethasone and Treatment may vary due to age or medical condition  Airway Management Planned: Mask and Oral ETT  Additional Equipment: None  Intra-op Plan:   Post-operative Plan: Extubation in OR  Informed Consent: I have reviewed the patients History and Physical, chart, labs and discussed the procedure including the risks, benefits and alternatives for the proposed anesthesia with the patient or authorized representative who has indicated his/her understanding and acceptance.     Dental advisory given  Plan Discussed with: CRNA  Anesthesia Plan Comments: (See PAT note 07/07/2021, Konrad Felix Ward, PA-C Lab Results      Component                Value               Date                      WBC                      8.8                  07/07/2021                HGB                      10.1 (L)            07/07/2021                HCT                      32.4 (L)            07/07/2021                MCV                      81.4                07/07/2021                PLT                      308  07/07/2021           Lab Results      Component                Value               Date                      NA                       146 (H)             07/07/2021                K                        4.9                 07/07/2021                CO2                      25                  07/07/2021                GLUCOSE                  120 (H)             07/07/2021                BUN                      12                  07/07/2021                CREATININE               0.99                07/07/2021                CALCIUM                  9.9                 07/07/2021                GFRNONAA                 >60                 07/07/2021           Echo 06/20/2021 1. Left ventricular ejection fraction, by estimation, is 65 to 70%. Left  ventricular ejection fraction by 3D volume is 69 %. The left ventricle has  normal function. The left ventricle has no regional wall motion  abnormalities. Left ventricular diastolic  parameters are consistent with Grade I diastolic dysfunction (impaired  relaxation).  2. Normal RV free wall strain. Right ventricular systolic function is  normal. The right ventricular size is normal. Tricuspid regurgitation  signal is inadequate for assessing PA pressure.  3. Borderline abnormal PreA resevoir strain. Left atrial size was  severely dilated.  4. Right atrial size was moderately dilated.  5. The mitral valve is abnormal. Mild  to moderate mitral valve  regurgitation.  6. The aortic valve is tricuspid. Aortic valve regurgitation is trivial.  Mild aortic valve sclerosis is present, with no evidence of aortic valve  stenosis.  7. The inferior vena cava is  normal in size with greater than 50%  respiratory variability, suggesting right atrial pressure of 3 mmHg.)      Anesthesia Quick Evaluation

## 2021-07-10 NOTE — Progress Notes (Signed)
Anesthesia Chart Review   Case: [id number] Date/Time: 07/17/21 1045   Procedures:      ROBOTIC LEFT HEMICOLECTOMY (Left)     FLEXIBLE SIGMOIDOSCOPY     INDOCYANINE GREEN FLUORESCENCE IMAGING (ICG), INTRAOPERATIVE ASSESSMENT OF PERFUSION ICG   Anesthesia type: General   Pre-op diagnosis: COLON CANCER   Location: [facility] ROOM 02 / [facility]   Surgeons: Ileana Roup, MD       DISCUSSION:70 y.o. every day smoker with h/o HTN, atrial fibrillation (on Eliquis), colon cancer scheduled for above procedure 07/17/2021 with Dr. Nadeen Landau.   Per cardiology preoperative evaluation 07/02/21, "Chart reviewed as part of pre-operative protocol coverage. Patient was recently seen by Dr. Gwenlyn Found in clinic at which time he referenced pre-op clearance. Per his note, "She apparently does need colon surgery in the near future for what sounds like a partial colectomy for abnormality seen on colonoscopy.  I believe that she is at low risk to have this done given her lack of symptoms and normal LV function." 2D Echo 06/2021 unchanged felt unchanged from prior.   Pharmacist has given recommendations for anticoagulation: "Per office protocol, patient can hold Eliquis for 3 days prior to procedure. Patient will not need bridging with Lovenox (enoxaparin) around procedure. Patient should restart Eliquis on the evening of procedure or day after, at discretion of procedure MD""  Anticipate pt can proceed with planned procedure barring acute status change.   VS: BP (!) 143/88   Pulse 78   Temp 36.9 C (Oral)   Resp 20   Ht 5\' 1"  (1.549 m)   Wt 56.2 kg   SpO2 100%   BMI 23.43 kg/m   PROVIDERS: Delford Field, FNP is PCP   Quay Burow, MD is Cardiologist  LABS: Labs reviewed: Acceptable for surgery. (all labs ordered are listed, but only abnormal results are displayed)  Labs Reviewed  CBC WITH DIFFERENTIAL/PLATELET - Abnormal; Notable for the following components:      Result Value   Hemoglobin  10.1 (*)    HCT 32.4 (*)    MCH 25.4 (*)    RDW 21.9 (*)    All other components within normal limits  COMPREHENSIVE METABOLIC PANEL - Abnormal; Notable for the following components:   Sodium 146 (*)    Chloride 113 (*)    Glucose, Bld 120 (*)    AST 12 (*)    All other components within normal limits  PROTIME-INR - Abnormal; Notable for the following components:   Prothrombin Time 17.1 (*)    INR 1.4 (*)    All other components within normal limits  HEMOGLOBIN A1C  TYPE AND SCREEN     IMAGES:   EKG: 06/04/2021 Rate 71 bpm  NSR  CV: Echo 06/20/2021  1. Left ventricular ejection fraction, by estimation, is 65 to 70%. Left  ventricular ejection fraction by 3D volume is 69 %. The left ventricle has  normal function. The left ventricle has no regional wall motion  abnormalities. Left ventricular diastolic   parameters are consistent with Grade I diastolic dysfunction (impaired  relaxation).   2. Normal RV free wall strain. Right ventricular systolic function is  normal. The right ventricular size is normal. Tricuspid regurgitation  signal is inadequate for assessing PA pressure.   3. Borderline abnormal PreA resevoir strain. Left atrial size was  severely dilated.   4. Right atrial size was moderately dilated.   5. The mitral valve is abnormal. Mild to moderate mitral valve  regurgitation.  6. The aortic valve is tricuspid. Aortic valve regurgitation is trivial.  Mild aortic valve sclerosis is present, with no evidence of aortic valve  stenosis.   7. The inferior vena cava is normal in size with greater than 50%  respiratory variability, suggesting right atrial pressure of 3 mmHg. Past Medical History:  Diagnosis Date   Abnormal Pap smear    Allergy    Atrial fibrillation (HCC)    Dysrhythmia    Hammer toe    Hypertension     Past Surgical History:  Procedure Laterality Date   ABDOMINAL HYSTERECTOMY     HAMMER TOE SURGERY Right 2019   LAPAROSCOPIC  SUPRACERVICAL HYSTERECTOMY     left ovary  10/12/1986   Varicose veins      MEDICATIONS:  bisacodyl 5 MG EC tablet   diltiazem (CARDIZEM CD) 240 MG 24 hr capsule   ELIQUIS 5 MG TABS tablet   ferrous sulfate 325 (65 FE) MG tablet   ibuprofen (ADVIL) 800 MG tablet   losartan (COZAAR) 50 MG tablet   meloxicam (MOBIC) 15 MG tablet   metroNIDAZOLE (FLAGYL) 250 MG tablet   metroNIDAZOLE (FLAGYL) 500 MG tablet   neomycin (MYCIFRADIN) 500 MG tablet   omeprazole (PRILOSEC) 20 MG capsule   pantoprazole (PROTONIX) 40 MG tablet   polyethylene glycol powder (GLYCOLAX/MIRALAX) 17 GM/SCOOP powder   sertraline (ZOLOFT) 100 MG tablet   No current facility-administered medications for this encounter.    Konrad Felix Ward, PA-C [facility]-Surgical Testing [phone]

## 2021-07-15 ENCOUNTER — Other Ambulatory Visit: Payer: Self-pay | Admitting: Surgery

## 2021-07-16 LAB — SARS CORONAVIRUS 2 (TAT 6-24 HRS): SARS Coronavirus 2: NEGATIVE

## 2021-07-16 MED ORDER — CLINDAMYCIN PHOSPHATE 900 MG/50ML IV SOLN
900.0000 mg | INTRAVENOUS | Status: AC
  Administered 2021-07-17: 900 mg via INTRAVENOUS
  Filled 2021-07-16: qty 50

## 2021-07-16 MED ORDER — GENTAMICIN SULFATE 40 MG/ML IJ SOLN
5.0000 mg/kg | INTRAVENOUS | Status: AC
  Administered 2021-07-17: 280 mg via INTRAVENOUS
  Filled 2021-07-16: qty 7

## 2021-07-17 ENCOUNTER — Inpatient Hospital Stay (HOSPITAL_COMMUNITY): Payer: Medicare HMO | Admitting: Registered Nurse

## 2021-07-17 ENCOUNTER — Encounter (HOSPITAL_COMMUNITY): Payer: Self-pay | Admitting: Surgery

## 2021-07-17 ENCOUNTER — Other Ambulatory Visit: Payer: Self-pay

## 2021-07-17 ENCOUNTER — Inpatient Hospital Stay (HOSPITAL_COMMUNITY): Payer: Medicare HMO | Admitting: Physician Assistant

## 2021-07-17 ENCOUNTER — Inpatient Hospital Stay (HOSPITAL_COMMUNITY)
Admission: RE | Admit: 2021-07-17 | Discharge: 2021-07-22 | DRG: 330 | Disposition: A | Discharge status: Home / Self Care | Payer: Medicare HMO | Attending: Surgery | Admitting: Surgery

## 2021-07-17 ENCOUNTER — Encounter (HOSPITAL_COMMUNITY)
Admission: RE | Disposition: A | Discharge status: Home / Self Care | Payer: Self-pay | Source: Home / Self Care | Attending: Surgery

## 2021-07-17 DIAGNOSIS — K219 Gastro-esophageal reflux disease without esophagitis: Secondary | ICD-10-CM | POA: Diagnosis present

## 2021-07-17 DIAGNOSIS — Z7901 Long term (current) use of anticoagulants: Secondary | ICD-10-CM | POA: Diagnosis not present

## 2021-07-17 DIAGNOSIS — Z803 Family history of malignant neoplasm of breast: Secondary | ICD-10-CM | POA: Diagnosis not present

## 2021-07-17 DIAGNOSIS — I482 Chronic atrial fibrillation, unspecified: Secondary | ICD-10-CM | POA: Diagnosis present

## 2021-07-17 DIAGNOSIS — Z9049 Acquired absence of other specified parts of digestive tract: Secondary | ICD-10-CM

## 2021-07-17 DIAGNOSIS — Z800 Family history of malignant neoplasm of digestive organs: Secondary | ICD-10-CM | POA: Diagnosis not present

## 2021-07-17 DIAGNOSIS — D62 Acute posthemorrhagic anemia: Secondary | ICD-10-CM | POA: Diagnosis present

## 2021-07-17 DIAGNOSIS — C186 Malignant neoplasm of descending colon: Principal | ICD-10-CM

## 2021-07-17 DIAGNOSIS — F17290 Nicotine dependence, other tobacco product, uncomplicated: Secondary | ICD-10-CM | POA: Diagnosis present

## 2021-07-17 DIAGNOSIS — I4891 Unspecified atrial fibrillation: Principal | ICD-10-CM

## 2021-07-17 DIAGNOSIS — F17210 Nicotine dependence, cigarettes, uncomplicated: Secondary | ICD-10-CM | POA: Diagnosis present

## 2021-07-17 DIAGNOSIS — I10 Essential (primary) hypertension: Secondary | ICD-10-CM | POA: Diagnosis present

## 2021-07-17 DIAGNOSIS — E785 Hyperlipidemia, unspecified: Secondary | ICD-10-CM | POA: Diagnosis present

## 2021-07-17 DIAGNOSIS — Z90711 Acquired absence of uterus with remaining cervical stump: Secondary | ICD-10-CM | POA: Diagnosis not present

## 2021-07-17 DIAGNOSIS — E440 Moderate protein-calorie malnutrition: Secondary | ICD-10-CM | POA: Diagnosis present

## 2021-07-17 DIAGNOSIS — I480 Paroxysmal atrial fibrillation: Secondary | ICD-10-CM | POA: Diagnosis present

## 2021-07-17 DIAGNOSIS — K3189 Other diseases of stomach and duodenum: Secondary | ICD-10-CM

## 2021-07-17 DIAGNOSIS — Z880 Allergy status to penicillin: Secondary | ICD-10-CM | POA: Diagnosis not present

## 2021-07-17 DIAGNOSIS — K660 Peritoneal adhesions (postprocedural) (postinfection): Secondary | ICD-10-CM | POA: Diagnosis present

## 2021-07-17 HISTORY — PX: FLEXIBLE SIGMOIDOSCOPY: SHX5431

## 2021-07-17 LAB — TYPE AND SCREEN
ABO/RH(D): O POS
Antibody Screen: NEGATIVE

## 2021-07-17 LAB — ABO/RH: ABO/RH(D): O POS

## 2021-07-17 LAB — GLUCOSE, CAPILLARY: Glucose-Capillary: 196 mg/dL — ABNORMAL HIGH (ref 70–99)

## 2021-07-17 SURGERY — COLECTOMY, SIGMOID, ROBOT-ASSISTED
Anesthesia: General | Site: Rectum

## 2021-07-17 MED ORDER — TRAMADOL HCL 50 MG PO TABS
50.0000 mg | ORAL_TABLET | Freq: Four times a day (QID) | ORAL | Status: DC | PRN
Start: 2021-07-17 — End: 2021-07-21
  Administered 2021-07-18: 50 mg via ORAL
  Filled 2021-07-17: qty 1

## 2021-07-17 MED ORDER — ACETAMINOPHEN 500 MG PO TABS
1000.0000 mg | ORAL_TABLET | Freq: Four times a day (QID) | ORAL | Status: DC
  Administered 2021-07-17 – 2021-07-22 (×16): 1000 mg via ORAL
  Filled 2021-07-17 (×16): qty 2

## 2021-07-17 MED ORDER — ACETAMINOPHEN 500 MG PO TABS
1000.0000 mg | ORAL_TABLET | ORAL | Status: AC
  Administered 2021-07-17: 1000 mg via ORAL
  Filled 2021-07-17: qty 2

## 2021-07-17 MED ORDER — ENSURE PRE-SURGERY PO LIQD
296.0000 mL | Freq: Once | ORAL | Status: DC
  Filled 2021-07-17: qty 296

## 2021-07-17 MED ORDER — ALVIMOPAN 12 MG PO CAPS
12.0000 mg | ORAL_CAPSULE | ORAL | Status: AC
  Administered 2021-07-17: 12 mg via ORAL
  Filled 2021-07-17: qty 1

## 2021-07-17 MED ORDER — PHENYLEPHRINE HCL-NACL 20-0.9 MG/250ML-% IV SOLN
INTRAVENOUS | Status: AC
  Filled 2021-07-17: qty 250

## 2021-07-17 MED ORDER — IBUPROFEN 400 MG PO TABS
600.0000 mg | ORAL_TABLET | Freq: Four times a day (QID) | ORAL | Status: DC | PRN
  Administered 2021-07-18 – 2021-07-19 (×2): 600 mg via ORAL
  Filled 2021-07-17 (×2): qty 1

## 2021-07-17 MED ORDER — BUPIVACAINE LIPOSOME 1.3 % IJ SUSP: 20.0000 mL | Freq: Once | INTRAMUSCULAR | Status: DC

## 2021-07-17 MED ORDER — ENSURE SURGERY PO LIQD
237.0000 mL | Freq: Two times a day (BID) | ORAL | Status: DC
  Administered 2021-07-18 (×2): 237 mL via ORAL

## 2021-07-17 MED ORDER — METRONIDAZOLE 500 MG PO TABS: 1000.0000 mg | ORAL_TABLET | ORAL | Status: DC

## 2021-07-17 MED ORDER — BUPIVACAINE-EPINEPHRINE (PF) 0.25% -1:200000 IJ SOLN
INTRAMUSCULAR | Status: DC | PRN
  Administered 2021-07-17: 30 mL

## 2021-07-17 MED ORDER — ONDANSETRON HCL 4 MG/2ML IJ SOLN
INTRAMUSCULAR | Status: AC
  Filled 2021-07-17: qty 2

## 2021-07-17 MED ORDER — ALUM & MAG HYDROXIDE-SIMETH 200-200-20 MG/5ML PO SUSP: 30.0000 mL | Freq: Four times a day (QID) | ORAL | Status: DC | PRN

## 2021-07-17 MED ORDER — 0.9 % SODIUM CHLORIDE (POUR BTL) OPTIME
TOPICAL | Status: DC | PRN
  Administered 2021-07-17: 2000 mL

## 2021-07-17 MED ORDER — ORAL CARE MOUTH RINSE: 15.0000 mL | Freq: Once | OROMUCOSAL | Status: DC

## 2021-07-17 MED ORDER — CHLORHEXIDINE GLUCONATE CLOTH 2 % EX PADS: 6.0000 | MEDICATED_PAD | Freq: Once | CUTANEOUS | Status: DC

## 2021-07-17 MED ORDER — BISACODYL 5 MG PO TBEC: 20.0000 mg | DELAYED_RELEASE_TABLET | Freq: Once | ORAL | Status: DC

## 2021-07-17 MED ORDER — INDOCYANINE GREEN 25 MG IV SOLR
INTRAVENOUS | Status: DC | PRN
  Administered 2021-07-17: 10 mg via INTRAVENOUS
  Administered 2021-07-17: 7.5 mg via INTRAVENOUS

## 2021-07-17 MED ORDER — INSULIN ASPART 100 UNIT/ML IJ SOLN
0.0000 [IU] | Freq: Three times a day (TID) | INTRAMUSCULAR | Status: DC
  Administered 2021-07-18 – 2021-07-20 (×6): 1 [IU] via SUBCUTANEOUS
  Administered 2021-07-21: 2 [IU] via SUBCUTANEOUS

## 2021-07-17 MED ORDER — HYDRALAZINE HCL 20 MG/ML IJ SOLN: 10.0000 mg | INTRAMUSCULAR | Status: DC | PRN

## 2021-07-17 MED ORDER — ROCURONIUM BROMIDE 10 MG/ML (PF) SYRINGE
PREFILLED_SYRINGE | INTRAVENOUS | Status: DC | PRN
  Administered 2021-07-17 (×2): 20 mg via INTRAVENOUS
  Administered 2021-07-17: 50 mg via INTRAVENOUS

## 2021-07-17 MED ORDER — LOSARTAN POTASSIUM 50 MG PO TABS
50.0000 mg | ORAL_TABLET | Freq: Every morning | ORAL | Status: DC
  Administered 2021-07-18 – 2021-07-22 (×5): 50 mg via ORAL
  Filled 2021-07-17 (×5): qty 1

## 2021-07-17 MED ORDER — PROPOFOL 10 MG/ML IV BOLUS
INTRAVENOUS | Status: AC
  Filled 2021-07-17: qty 20

## 2021-07-17 MED ORDER — LACTATED RINGERS IV SOLN: INTRAVENOUS | Status: DC

## 2021-07-17 MED ORDER — LIDOCAINE HCL (PF) 2 % IJ SOLN
INTRAMUSCULAR | Status: AC
  Filled 2021-07-17: qty 5

## 2021-07-17 MED ORDER — PANTOPRAZOLE SODIUM 40 MG PO TBEC
40.0000 mg | DELAYED_RELEASE_TABLET | Freq: Every day | ORAL | Status: DC
  Administered 2021-07-18 – 2021-07-22 (×5): 40 mg via ORAL
  Filled 2021-07-17 (×5): qty 1

## 2021-07-17 MED ORDER — BUPIVACAINE LIPOSOME 1.3 % IJ SUSP
INTRAMUSCULAR | Status: AC
  Filled 2021-07-17: qty 20

## 2021-07-17 MED ORDER — HEPARIN SODIUM (PORCINE) 5000 UNIT/ML IJ SOLN
5000.0000 [IU] | Freq: Once | INTRAMUSCULAR | Status: AC
  Administered 2021-07-17: 5000 [IU] via SUBCUTANEOUS
  Filled 2021-07-17: qty 1

## 2021-07-17 MED ORDER — LIDOCAINE 2% (20 MG/ML) 5 ML SYRINGE
INTRAMUSCULAR | Status: DC | PRN
  Administered 2021-07-17: 1.5 mg/kg/h via INTRAVENOUS
  Administered 2021-07-17: 60 mg via INTRAVENOUS

## 2021-07-17 MED ORDER — NEOMYCIN SULFATE 500 MG PO TABS: 1000.0000 mg | ORAL_TABLET | ORAL | Status: DC

## 2021-07-17 MED ORDER — MIDAZOLAM HCL 5 MG/5ML IJ SOLN
INTRAMUSCULAR | Status: DC | PRN
  Administered 2021-07-17 (×2): 1 mg via INTRAVENOUS

## 2021-07-17 MED ORDER — HYDROMORPHONE HCL 1 MG/ML IJ SOLN: 0.5000 mg | INTRAMUSCULAR | Status: DC | PRN

## 2021-07-17 MED ORDER — ROCURONIUM BROMIDE 10 MG/ML (PF) SYRINGE
PREFILLED_SYRINGE | INTRAVENOUS | Status: AC
  Filled 2021-07-17: qty 10

## 2021-07-17 MED ORDER — FENTANYL CITRATE PF 50 MCG/ML IJ SOSY
PREFILLED_SYRINGE | INTRAMUSCULAR | Status: AC
  Filled 2021-07-17: qty 2

## 2021-07-17 MED ORDER — FENTANYL CITRATE PF 50 MCG/ML IJ SOSY: 25.0000 ug | PREFILLED_SYRINGE | INTRAMUSCULAR | Status: DC | PRN

## 2021-07-17 MED ORDER — SUGAMMADEX SODIUM 200 MG/2ML IV SOLN
INTRAVENOUS | Status: DC | PRN
  Administered 2021-07-17: 200 mg via INTRAVENOUS

## 2021-07-17 MED ORDER — HEPARIN SODIUM (PORCINE) 5000 UNIT/ML IJ SOLN
5000.0000 [IU] | Freq: Three times a day (TID) | INTRAMUSCULAR | Status: DC
  Administered 2021-07-17 – 2021-07-20 (×8): 5000 [IU] via SUBCUTANEOUS
  Filled 2021-07-17 (×8): qty 1

## 2021-07-17 MED ORDER — INSULIN ASPART 100 UNIT/ML IJ SOLN: 0.0000 [IU] | Freq: Every day | INTRAMUSCULAR | Status: DC

## 2021-07-17 MED ORDER — ENSURE PRE-SURGERY PO LIQD
592.0000 mL | Freq: Once | ORAL | Status: DC
  Filled 2021-07-17: qty 592

## 2021-07-17 MED ORDER — LACTATED RINGERS IR SOLN
Status: DC | PRN
  Administered 2021-07-17: 1000 mL

## 2021-07-17 MED ORDER — MIDAZOLAM HCL 2 MG/2ML IJ SOLN
INTRAMUSCULAR | Status: AC
  Filled 2021-07-17: qty 2

## 2021-07-17 MED ORDER — PROPOFOL 10 MG/ML IV BOLUS
INTRAVENOUS | Status: DC | PRN
  Administered 2021-07-17: 80 mg via INTRAVENOUS

## 2021-07-17 MED ORDER — ALVIMOPAN 12 MG PO CAPS
12.0000 mg | ORAL_CAPSULE | Freq: Two times a day (BID) | ORAL | Status: DC
  Administered 2021-07-18: 12 mg via ORAL
  Filled 2021-07-17: qty 1

## 2021-07-17 MED ORDER — FENTANYL CITRATE (PF) 100 MCG/2ML IJ SOLN
INTRAMUSCULAR | Status: AC
  Filled 2021-07-17: qty 2

## 2021-07-17 MED ORDER — DIPHENHYDRAMINE HCL 50 MG/ML IJ SOLN: 12.5000 mg | Freq: Four times a day (QID) | INTRAMUSCULAR | Status: DC | PRN

## 2021-07-17 MED ORDER — BUPIVACAINE-EPINEPHRINE (PF) 0.25% -1:200000 IJ SOLN
INTRAMUSCULAR | Status: AC
  Filled 2021-07-17: qty 30

## 2021-07-17 MED ORDER — DEXAMETHASONE SODIUM PHOSPHATE 10 MG/ML IJ SOLN
INTRAMUSCULAR | Status: DC | PRN
  Administered 2021-07-17: 8 mg via INTRAVENOUS

## 2021-07-17 MED ORDER — POLYETHYLENE GLYCOL 3350 17 GM/SCOOP PO POWD: 1.0000 | Freq: Once | ORAL | Status: DC

## 2021-07-17 MED ORDER — DIPHENHYDRAMINE HCL 12.5 MG/5ML PO ELIX: 12.5000 mg | ORAL_SOLUTION | Freq: Four times a day (QID) | ORAL | Status: DC | PRN

## 2021-07-17 MED ORDER — LACTATED RINGERS IV SOLN: INTRAVENOUS | Status: DC | PRN

## 2021-07-17 MED ORDER — DEXAMETHASONE SODIUM PHOSPHATE 10 MG/ML IJ SOLN
INTRAMUSCULAR | Status: AC
  Filled 2021-07-17: qty 1

## 2021-07-17 MED ORDER — PHENYLEPHRINE HCL-NACL 20-0.9 MG/250ML-% IV SOLN
INTRAVENOUS | Status: DC | PRN
  Administered 2021-07-17: 10 ug/min via INTRAVENOUS

## 2021-07-17 MED ORDER — BUPIVACAINE LIPOSOME 1.3 % IJ SUSP
INTRAMUSCULAR | Status: DC | PRN
  Administered 2021-07-17: 20 mL

## 2021-07-17 MED ORDER — DILTIAZEM HCL ER COATED BEADS 240 MG PO CP24
240.0000 mg | ORAL_CAPSULE | Freq: Every day | ORAL | Status: DC
  Administered 2021-07-18 – 2021-07-22 (×5): 240 mg via ORAL
  Filled 2021-07-17 (×5): qty 1

## 2021-07-17 MED ORDER — ONDANSETRON HCL 4 MG/2ML IJ SOLN
INTRAMUSCULAR | Status: DC | PRN
  Administered 2021-07-17: 4 mg via INTRAVENOUS

## 2021-07-17 MED ORDER — LIDOCAINE HCL 2 % IJ SOLN
INTRAMUSCULAR | Status: AC
  Filled 2021-07-17: qty 20

## 2021-07-17 MED ORDER — SIMETHICONE 80 MG PO CHEW: 40.0000 mg | CHEWABLE_TABLET | Freq: Four times a day (QID) | ORAL | Status: DC | PRN

## 2021-07-17 MED ORDER — LIP MEDEX EX OINT
1.0000 "application " | TOPICAL_OINTMENT | CUTANEOUS | Status: DC | PRN
  Filled 2021-07-17: qty 7

## 2021-07-17 MED ORDER — ONDANSETRON HCL 4 MG PO TABS
4.0000 mg | ORAL_TABLET | Freq: Four times a day (QID) | ORAL | Status: DC | PRN
  Filled 2021-07-17: qty 1

## 2021-07-17 MED ORDER — CHLORHEXIDINE GLUCONATE 0.12 % MT SOLN: 15.0000 mL | Freq: Once | OROMUCOSAL | Status: DC

## 2021-07-17 MED ORDER — FENTANYL CITRATE (PF) 100 MCG/2ML IJ SOLN
INTRAMUSCULAR | Status: DC | PRN
  Administered 2021-07-17: 50 ug via INTRAVENOUS
  Administered 2021-07-17: 25 ug via INTRAVENOUS
  Administered 2021-07-17: 100 ug via INTRAVENOUS
  Administered 2021-07-17 (×3): 25 ug via INTRAVENOUS

## 2021-07-17 MED ORDER — FERROUS SULFATE 325 (65 FE) MG PO TABS
325.0000 mg | ORAL_TABLET | Freq: Every morning | ORAL | Status: DC
  Administered 2021-07-18 – 2021-07-22 (×5): 325 mg via ORAL
  Filled 2021-07-17 (×4): qty 1

## 2021-07-17 MED ORDER — ACETAMINOPHEN 10 MG/ML IV SOLN: 1000.0000 mg | Freq: Once | INTRAVENOUS | Status: DC | PRN

## 2021-07-17 MED ORDER — ONDANSETRON HCL 4 MG/2ML IJ SOLN
4.0000 mg | Freq: Four times a day (QID) | INTRAMUSCULAR | Status: DC | PRN
  Administered 2021-07-20: 4 mg via INTRAVENOUS
  Filled 2021-07-17: qty 2

## 2021-07-17 SURGICAL SUPPLY — 115 items
APL PRP STRL LF DISP 70% ISPRP (MISCELLANEOUS) ×3
APPLIER CLIP 5 13 M/L LIGAMAX5 (MISCELLANEOUS)
APPLIER CLIP ROT 10 11.4 M/L (STAPLE)
APR CLP MED LRG 11.4X10 (STAPLE)
APR CLP MED LRG 5 ANG JAW (MISCELLANEOUS)
BAG COUNTER SPONGE SURGICOUNT (BAG) IMPLANT
BAG SPNG CNTER NS LX DISP (BAG)
BLADE EXTENDED COATED 6.5IN (ELECTRODE) ×4 IMPLANT
CANNULA REDUC XI 12-8 STAPL (CANNULA) ×4
CANNULA REDUCER 12-8 DVNC XI (CANNULA) ×3 IMPLANT
CELLS DAT CNTRL 66122 CELL SVR (MISCELLANEOUS) ×3 IMPLANT
CHLORAPREP W/TINT 26 (MISCELLANEOUS) ×4 IMPLANT
CLIP APPLIE 5 13 M/L LIGAMAX5 (MISCELLANEOUS) IMPLANT
CLIP APPLIE ROT 10 11.4 M/L (STAPLE) IMPLANT
CLIP LIGATING HEMO O LOK GREEN (MISCELLANEOUS) IMPLANT
COVER SURGICAL LIGHT HANDLE (MISCELLANEOUS) ×8 IMPLANT
COVER TIP SHEARS 8 DVNC (MISCELLANEOUS) ×3 IMPLANT
COVER TIP SHEARS 8MM DA VINCI (MISCELLANEOUS) ×4
DECANTER SPIKE VIAL GLASS SM (MISCELLANEOUS) ×4 IMPLANT
DEVICE TROCAR PUNCTURE CLOSURE (ENDOMECHANICALS) IMPLANT
DRAIN CHANNEL 19F RND (DRAIN) ×4 IMPLANT
DRAPE ARM DVNC X/XI (DISPOSABLE) ×12 IMPLANT
DRAPE COLUMN DVNC XI (DISPOSABLE) ×3 IMPLANT
DRAPE DA VINCI XI ARM (DISPOSABLE) ×16
DRAPE DA VINCI XI COLUMN (DISPOSABLE) ×4
DRAPE SURG IRRIG POUCH 19X23 (DRAPES) ×4 IMPLANT
DRSG OPSITE POSTOP 4X10 (GAUZE/BANDAGES/DRESSINGS) IMPLANT
DRSG OPSITE POSTOP 4X6 (GAUZE/BANDAGES/DRESSINGS) ×4 IMPLANT
DRSG OPSITE POSTOP 4X8 (GAUZE/BANDAGES/DRESSINGS) IMPLANT
DRSG TEGADERM 2-3/8X2-3/4 SM (GAUZE/BANDAGES/DRESSINGS) ×20 IMPLANT
DRSG TEGADERM 4X4.75 (GAUZE/BANDAGES/DRESSINGS) ×4 IMPLANT
ELECT REM PT RETURN 15FT ADLT (MISCELLANEOUS) ×4 IMPLANT
ENDOLOOP SUT PDS II  0 18 (SUTURE)
ENDOLOOP SUT PDS II 0 18 (SUTURE) IMPLANT
EVACUATOR SILICONE 100CC (DRAIN) ×4 IMPLANT
GAUZE SPONGE 2X2 8PLY STRL LF (GAUZE/BANDAGES/DRESSINGS) ×3 IMPLANT
GAUZE SPONGE 4X4 12PLY STRL (GAUZE/BANDAGES/DRESSINGS) IMPLANT
GLOVE SURG ENC MOIS LTX SZ7.5 (GLOVE) ×12 IMPLANT
GLOVE SURG UNDER LTX SZ8 (GLOVE) ×12 IMPLANT
GOWN STRL REUS W/TWL XL LVL3 (GOWN DISPOSABLE) ×20 IMPLANT
GRASPER SUT TROCAR 14GX15 (MISCELLANEOUS) IMPLANT
HOLDER FOLEY CATH W/STRAP (MISCELLANEOUS) ×4 IMPLANT
KIT PROCEDURE DA VINCI SI (MISCELLANEOUS)
KIT PROCEDURE DVNC SI (MISCELLANEOUS) IMPLANT
KIT TURNOVER KIT A (KITS) ×4 IMPLANT
LIGASURE IMPACT 36 18CM CVD LR (INSTRUMENTS) ×4 IMPLANT
NEEDLE INSUFFLATION 14GA 120MM (NEEDLE) ×4 IMPLANT
PACK CARDIOVASCULAR III (CUSTOM PROCEDURE TRAY) ×4 IMPLANT
PACK COLON (CUSTOM PROCEDURE TRAY) ×4 IMPLANT
PAD POSITIONING PINK XL (MISCELLANEOUS) ×4 IMPLANT
PENCIL SMOKE EVACUATOR (MISCELLANEOUS) IMPLANT
PROTECTOR NERVE ULNAR (MISCELLANEOUS) ×8 IMPLANT
RELOAD PROXIMATE 75MM BLUE (ENDOMECHANICALS) ×8 IMPLANT
RELOAD STAPLER 3.5X45 BLU DVNC (STAPLE) IMPLANT
RELOAD STAPLER 3.5X60 BLU DVNC (STAPLE) IMPLANT
RELOAD STAPLER 4.3X45 GRN DVNC (STAPLE) ×3 IMPLANT
RELOAD STAPLER 4.3X60 GRN DVNC (STAPLE) ×3 IMPLANT
RTRCTR WOUND ALEXIS 18CM MED (MISCELLANEOUS) ×4
SCISSORS LAP 5X35 DISP (ENDOMECHANICALS) IMPLANT
SEAL CANN UNIV 5-8 DVNC XI (MISCELLANEOUS) ×12 IMPLANT
SEAL XI 5MM-8MM UNIVERSAL (MISCELLANEOUS) ×16
SEALER VESSEL DA VINCI XI (MISCELLANEOUS) ×4
SEALER VESSEL EXT DVNC XI (MISCELLANEOUS) ×3 IMPLANT
SET IRRIG TUBING LAPAROSCOPIC (IRRIGATION / IRRIGATOR) ×4 IMPLANT
SLEEVE ADV FIXATION 5X100MM (TROCAR) ×4 IMPLANT
SOLUTION ELECTROLUBE (MISCELLANEOUS) ×4 IMPLANT
SPONGE GAUZE 2X2 STER 10/PKG (GAUZE/BANDAGES/DRESSINGS) ×1
STAPLER 60 DA VINCI SURE FORM (STAPLE) ×4
STAPLER 60 SUREFORM DVNC (STAPLE) ×3 IMPLANT
STAPLER CANNULA SEAL DVNC XI (STAPLE) ×3 IMPLANT
STAPLER CANNULA SEAL XI (STAPLE) ×4
STAPLER ECHELON POWER CIR 29 (STAPLE) ×4 IMPLANT
STAPLER ECHELON POWER CIR 31 (STAPLE) IMPLANT
STAPLER PROXIMATE 75MM BLUE (STAPLE) ×4 IMPLANT
STAPLER RELOAD 3.5X45 BLU DVNC (STAPLE)
STAPLER RELOAD 3.5X45 BLUE (STAPLE)
STAPLER RELOAD 3.5X60 BLU DVNC (STAPLE)
STAPLER RELOAD 3.5X60 BLUE (STAPLE)
STAPLER RELOAD 4.3X45 GREEN (STAPLE) ×4
STAPLER RELOAD 4.3X45 GRN DVNC (STAPLE) ×3
STAPLER RELOAD 4.3X60 GREEN (STAPLE) ×4
STAPLER RELOAD 4.3X60 GRN DVNC (STAPLE) ×3
STAPLER SHEATH (SHEATH)
STAPLER SHEATH ENDOWRIST DVNC (SHEATH) IMPLANT
STOPCOCK 4 WAY LG BORE MALE ST (IV SETS) ×8 IMPLANT
SURGILUBE 2OZ TUBE FLIPTOP (MISCELLANEOUS) ×4 IMPLANT
SUT MNCRL AB 4-0 PS2 18 (SUTURE) ×4 IMPLANT
SUT PDS AB 1 CT1 27 (SUTURE) IMPLANT
SUT PDS AB 1 TP1 96 (SUTURE) IMPLANT
SUT PROLENE 0 CT 2 (SUTURE) IMPLANT
SUT PROLENE 2 0 KS (SUTURE) ×4 IMPLANT
SUT PROLENE 2 0 SH DA (SUTURE) IMPLANT
SUT SILK 2 0 (SUTURE)
SUT SILK 2 0 SH CR/8 (SUTURE) IMPLANT
SUT SILK 2-0 18XBRD TIE 12 (SUTURE) IMPLANT
SUT SILK 3 0 (SUTURE) ×4
SUT SILK 3 0 SH CR/8 (SUTURE) ×4 IMPLANT
SUT SILK 3-0 18XBRD TIE 12 (SUTURE) ×3 IMPLANT
SUT V-LOC BARB 180 2/0GR6 GS22 (SUTURE)
SUT VIC AB 3-0 SH 18 (SUTURE) IMPLANT
SUT VIC AB 3-0 SH 27 (SUTURE)
SUT VIC AB 3-0 SH 27XBRD (SUTURE) IMPLANT
SUT VICRYL 0 UR6 27IN ABS (SUTURE) ×4 IMPLANT
SUTURE V-LC BRB 180 2/0GR6GS22 (SUTURE) IMPLANT
SYR 10ML LL (SYRINGE) ×4 IMPLANT
SYS LAPSCP GELPORT 120MM (MISCELLANEOUS)
SYS WOUND ALEXIS 18CM MED (MISCELLANEOUS) ×4
SYSTEM LAPSCP GELPORT 120MM (MISCELLANEOUS) IMPLANT
SYSTEM WOUND ALEXIS 18CM MED (MISCELLANEOUS) ×3 IMPLANT
TAPE UMBILICAL 1/8 X36 TWILL (MISCELLANEOUS) ×4 IMPLANT
TOWEL OR NON WOVEN STRL DISP B (DISPOSABLE) ×4 IMPLANT
TRAY FOLEY MTR SLVR 16FR STAT (SET/KITS/TRAYS/PACK) ×4 IMPLANT
TROCAR ADV FIXATION 5X100MM (TROCAR) ×4 IMPLANT
TUBING CONNECTING 10 (TUBING) ×8 IMPLANT
TUBING INSUFFLATION 10FT LAP (TUBING) ×4 IMPLANT

## 2021-07-17 NOTE — Transfer of Care (Signed)
Immediate Anesthesia Transfer of Care Note  Patient: Kelsey Bridges  Procedure(s) Performed: ROBOTIC LEFT HEMICOLECTOMY, URETEROLYSIS, TAKEDOWN OF SPLENIC FLEXURE, LYSIS OF ADHESIONS X 90 MIN, REPAIR OF SMALL BOWEL, REPAIR OF COLON (Left: Abdomen) FLEXIBLE SIGMOIDOSCOPY (Rectum) INDOCYANINE GREEN FLUORESCENCE IMAGING (ICG), INTRAOPERATIVE ASSESSMENT OF PERFUSION ICG (Abdomen)  Patient Location: PACU  Anesthesia Type:General  Level of Consciousness: drowsy and patient cooperative  Airway & Oxygen Therapy: Patient Spontanous Breathing and Patient connected to face mask oxygen  Post-op Assessment: Report given to RN and Post -op Vital signs reviewed and stable  Post vital signs: Reviewed and stable  Last Vitals:  Vitals Value Taken Time  BP 167/92 07/17/21 1711  Temp    Pulse 94 07/17/21 1714  Resp 23 07/17/21 1714  SpO2 94 % 07/17/21 1714  Vitals shown include unvalidated device data.  Last Pain:  Vitals:   07/17/21 0941  TempSrc: Oral  PainSc: 0-No pain      Patients Stated Pain Goal: 3 (37/09/64 3838)  Complications: No notable events documented.

## 2021-07-17 NOTE — Op Note (Signed)
PATIENT: Kelsey Bridges  70 y.o. female  Patient Care Team: Delford Field, FNP as PCP - General (Family Medicine) Lorretta Harp, MD as PCP - Cardiology (Cardiology)  PREOP DIAGNOSIS: COLON CANCER  POSTOP DIAGNOSIS: Descending colon cancer  PROCEDURE:  Robotic assisted left hemicolectomy with colorectal anastomosis Left ureterolysis Takedown of splenic flexure  Lysis of adhesions x 90 minutes Partial omentectomy Repair of small bowel serosa x1, repair of transverse colon serosa x1 Intraoperative assessment of perfusion Flexible sigmoidoscopy Bilateral transversus abdominus plane blocks  SURGEON: Sharon Mt. Ebb Carelock, MD  ASSISTANT: Michael Boston, MD  ANESTHESIA: General endotracheal  EBL: 150 mL Total I/O In: 3157 [I.V.:3000; IV Piggyback:157] Out: 400 [Urine:250; Blood:150]  DRAINS: None  SPECIMEN:  Left colon - stitch proximal, omentum Rectosigmoid colon and additional lymph nodes- stitch proximal  COUNTS: Sponge, needle and instrument counts were reported correct x2  FINDINGS:  Large bulky tumor in the descending colon.  No evident metastatic disease on visceral parietal peritoneum or liver. There is involvement of the mesentery either directly or with bulky adenopathy - this was adherent to the retroperitoneal plane.  Left ureterolysis was necessary to free this from the surrounding retroperitoneal tissues and ensure that there was no involvement of the ureter, of which there was not.  Some of Gerota's fascia was included with the specimen as well to ensure a negative deep margin.  There is no apparent involvement of the left kidney.  Medially, the mass abuts the ligament of Treitz and proximalmost aspect of the jejunum.  This was able to be freed sharply without any apparent involvement.  A small deserosalization occurred which was subsequently repaired.  There is also a small deserosalization that was repaired on the mid transverse colon.  Both of these  were inherent to the nature of this procedure due to the large bulky nature of this tumor.  For blood supply reasons, a left hemicolectomy is necessary.  The splenic flexure was mobilized.  A double stapled colorectal anastomosis was fashioned 15 cm from the anal verge by flexible sigmoidoscopy.  This is well perfused, tension-free, airtight, and hemostatic.  NARRATIVE: Informed consent was verified. She was taken to the operating room, placed supine on the operating table and SCD's were applied. General endotracheal anesthesia was induced without difficulty. The patient was then positioned in the lithotomy position with Allen stirrups.  A foley catheter was then placed by nursing under sterile conditions. Hair on the abdomen was clipped.  She was positioned with arms tucked.  She was secured to the table.  Pressure points were then evaluated and padded.  The abdomen was then prepped and draped in the standard sterile fashion. Surgical timeout was called indicating the correct patient, procedure, positioning and need for preoperative antibiotics.   An OG tube was placed by anesthesia and confirmed to be to suction.  At Palmer's point, a stab incision was created and the Veress needle was introduced into the peritoneal cavity on the first attempt.  Intraperitoneal location was confirmed by the aspiration and saline drop test.  Pneumoperitoneum was established to a maximum pressure of 15 mmHg using CO2.  Following this, the abdomen was marked for planned trocar sites.  Just to the right and cephalad to the umbilicus, an 8 mm incision was created and an 8 mm blunt tipped robotic trocar was cautiously placed into the peritoneal cavity.  The laparoscope was inserted and demonstrated no evidence of trocar site nor Veress needle site complications.  The Veress needle was  removed.  Bilateral transversus abdominis plane blocks were then created using a dilute mixture of Exparel with Marcaine.  3 additional 8 mm robotic  trochars were placed under direct visualization roughly in a line extending from the right ASIS towards the left upper quadrant. 2 additional 5 mm assist ports were placed in the right lateral abdomen under direct visualization.  The abdomen was surveyed and there was no evidence of peritoneal disease.  She does have omental adhesions across her midline abdomen presumably related to her prior hysterectomy.  The liver is enlarged but grossly without any surface metastasis.  Laparoscopic adhesiolysis was carried out sharply with scissors freeing much of the omentum from the abdominal wall. She was positioned in Trendelenburg with some left side up.  Small bowel was carefully retracted out of the pelvis.  Bilateral transversus abdominis plane blocks were created using dilute mixture of Exparel with quarter percent Marcaine with epinephrine.  The robot was then docked and I went to the console.  The remaining adhesions to the abdominal wall were then taken down robotically.  This was done sharply.  In total, the adhesiolysis took [place] approximately 90 minutes to complete.  There is a large bulky mass in the descending colon.  This does not appear to involve the abdominal wall.  There is extension into the mesentery versus bulky adenopathy.  There are no peritoneal deposits evident.  We began by mobilizing the sigmoid colon off of the intersigmoid fossa.  Given the large bulky nature of this, we opted to start with a lateral to medial approach.  The sigmoid was fully mobilized.  The descending colon was then mobilized medially.  The mass sits over the left ureter proximal to the pelvic brim.  It is unclear if it involves the left ureter as it is fairly close.  We then directed our attention to a superior to inferior approach.  We began by mobilizing the omentum off of the mid transverse colon working over towards the splenic flexure.  We then carefully took the splenic flexure down.  This was quite difficult as her colon  is densely adherent to the capsule of the spleen.  Were able to do this sharply without any apparent injury to the colon.  The splenic flexure was then fully mobilized reflecting this medially off the retroperitoneum.  The mass encroaches on the Gerota's fat and therefore the anteriormost aspect of this is included with the specimen.  Looking medially, the mass abuts the ligament of Treitz and is quite close to the proximalmost portion of the jejunum at the ligament of Treitz.  Using scissors, this is then tediously dissected and we are able to separate the small bowel from this and there is no apparent involvement of the serosal surface of the small intestine.  There is a small deserosalization that occurred during this which was inherent to these procedure.  Also, with the bulky nature of her tumor, retracting the transverse colon, a small deserosalization occurred.  Both of these were ultimately repaired using interrupted 3-0 silk Lembert sutures during the extracorporeal portion of the procedure.  We are able to then redirect our attention at the left ureter.  We have mobilized the mass both medially and from a superior to inferior approach.  The left ureter was then carefully traced proximally.  We were able to sharply dissect the mass from the retroperitoneal tissues just overlying the left ureter.  Left ureterolysis was necessary to ensure that there is no ureteral involvement.  We did  not skeletonize the ureter but did mobilize the colon off of the anterior surface.  At this point, the mass has been completely freed from the surrounding tissues and is fully mobilized.  Attention was directed at the IMA pedicle.  The rectosigmoid colon was elevated anteriorly.  The TME plane is then gained by incising the peritoneum on the right side.  We were working between the fascia propria of the rectum and the presacral fascia.  The peritoneum overlying the IMA pedicle was incised.  The lymphadenopathy appears to  involve some of the IMA chain nodes and therefore we opted to ligate the IMA for oncologic purposes.  The IMA is circumferentially dissected.  The left ureter is confirmed to be well away from our plan dissection and has been swept "down."  The IMA is then sealed and divided using the vessel sealer device.  The stump was inspected and noted to be hemostatic.  Again for oncologic purposes, it also appears necessary to take the IMV.  This will also allow better reach of what will likely end up being her transverse colon into the pelvis.  Just inferior to the border the pancreas, the IMV is circumferentially dissected, sealed, and ligated using the vessel sealer.  The IMV is inspected and noted to be hemostatic.  Attention is then directed at the proximal point of transection.  To ensure we have adequate blood supply, we opted to use the mid/distal transverse colon as our point of division, ensuring that the middle colic pedicle was available to perfuse the conduit.  This is quite redundant and easily reaches into her pelvis without any difficulty.  The mesentery is then cleared out to the level of planned proximal transection.  The colon is cleared out to the level of the distalmost sigmoid.  The mesentery is also sealed with the vessel sealer out to this point.  We then turned our attention to the extracorporeal portion of the procedure.  The robot was undocked and I scrubbed back in.  We opted to pursue a midline extraction incision given the bulky nature of this tumor and to ensure he had adequate reach of colon to our extraction site.  A periumbilical incision was created.  The fascia is incised electrocautery under pneumoperitoneum.  The peritoneal cavity is entered.  An Ludlow wound protector was placed.  Towels were placed on the field.  The descending colon was brought out through the wound protector.  Our planned point of proximal transection was identified.  There is a palpable pulse in the mesentery  going out to the planned point of transection.  A pursestring device was applied.  A 2-0 Prolene on a Keith needle was passed.  The proximal point of transection was divided sharply.  A stitch is placed marking the proximal point of division.  3-0 silk "belt loop" sutures were placed around our pursestring.  EEA sizers were brought in the field and 29 mm EEA selected.  The anvil was placed and the pursestring is tied.  There is minimal amount of mesentery within the planned point of anastomosis.  The distal point of transection was identified on the distal sigmoid.  This is divided with a 75 mm linear cutting blue load GIA stapler.  The specimen is passed off.  Her omentum was quite thin and of questionable viability.  Therefore, we opted to proceed with a partial omentectomy removing all the involved omentum ensuring that she has nothing but well-perfused omentum remaining behind.  This was passed off and  included with the specimen #1.  We then turned our attention to performing the ICG perfusion test.  The robot is docked.  I went back to the console.  ICG was administered.  There is avid uptake of the tracer out to the level of the anvil.  The rectosigmoid colon was inspected.  There is excellent uptake out to the level of the proximalmost aspect of the rectum.  It became clear that we needed to go slightly higher on our point of transection.  A robotic stapler was brought onto the field and utilizing a 12 mm port through our extraction site, a green load is selected.  The associated mesorectum was cleared out to the point of division.  There is again excellent perfusion at this location.  The stapler was applied, held, and fired.  The additional specimen was then removed from the abdomen with a suture marking the proximal end.  The anvil reaches into the pelvis easily and remains in that location any significant tension.  The robot was then undocked.    Pneumoperitoneum was reestablished.  I then went  below to pass the stapler.  My partner remained above.  EEA sizers were cautiously passed trans-anally under direct visualization.  The stapler was passed and the spike deployed just anterior to the staple line.  The components were then mated.  Orientation was confirmed such that there is no twisting of the colon nor small bowel underneath the mesenteric defect. Care was taken to ensure no other structures were incorporated within this either.  The stapler was then closed, held, and fired. This was then removed. The donuts were inspected and noted to be complete.  The colon proximal to the anastomosis was then gently occluded. The pelvis was filled with sterile irrigation. Under direct visualization, I passed a flexible sigmoidoscope.  The anastomosis was under water.  With good distention of the anastomosis there was no air leak. The anastomosis is pink in appearance.  This is located at 15 cm from the anal verge by flexible sigmoidoscopy.  It is hemostatic.  Additionally, looking from above, there is no tension on the colon or mesentery.  Sigmoidoscope was withdrawn.  Irrigation was evacuated from the pelvis.  The abdomen and pelvis are surveyed and noted to be completely hemostatic without any apparent injury.  Under direct visualization, all trochars are removed.  The Vandiver wound protector was removed.  Gowns/gloves are changed and a fresh set of clean instruments utilized. Additional sterile drapes were placed around the field.   Sponge, needle, and instrument counts were reported correct. The midline rectus fascia was then closed using 2 running #1 PDS sutures.  The fascia was then palpated and noted to be completely closed.  Additional anesthetic was infiltrated at the midline extraction site.  Sponge, needle, and instrument counts were then again reported correct. 4-0 Monocryl subcuticular suture was used to close the skin of all port sites and staples for the midline wound.  Dermabond was placed over  port incisions.  A honeycomb dressing placed over the Pfannenstiel as well.   She is taken out of lithotomy, awakened from anesthesia, extubated, and transferred to a stretcher for transport to PACU in satisfactory condition having tolerated the procedure well.

## 2021-07-17 NOTE — H&P (Signed)
Chief Complaint: colon cancer - here today for surgery  History of Present Illness: Kelsey Bridges is a 70 y.o. female with history of HTN, GERD, afib (on Eliquis, Dr. Gwenlyn Found), whom is seen in the office today as a referral by Dr. Lyndel Safe for evaluation of colon cancer.  She underwent a colonoscopy with Dr. Lyndel Safe 04/03/2021 which was done for a positive fecal immunochemical test. She was found to have: 1. 4 cm fungating infiltrative friable and necrotic medium size apical scar mass in the descending colon. This was circumferential. The scope was able to traverse. Area just distal to mass was biopsied and returned invasive adenocarcinoma 2. 2 polyps removed from ascending colon. 3. 2 polyps removed from splenic flexure and mid transverse colon.  CT CAP 04/19/21 shows mass at junction of descending/sigmoid colon. No evidence of metastatic disease within the chest/abdomen/pelvis.  She denies fever/chills/nausea/vomiting. She denies any significant abdominal distention. She denies any blood in her stool. She reports she has regular/daily soft formed bowel movements.  She has been doing well. Cardiac clearance obtained. She has been off her blood thinner x 3 days. She took her bowel prep with satisfactory result. Took prescribed abx albeit with nausea with neomycin so only got 1 tab of that. She denies abdominal pain, vomiting, etc.  PMH: HTN, GERD, afib (on Eliquis, Dr. Gwenlyn Found)  [place]: Hysterectomy 1980s  FHx: Denies any known family history of colorectal, breast, endometrial or ovarian cancer  Social Hx: Denies use of tobacco/EtOH/illicit drug. 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 She has been smoking an average of .2 packs per day. Her smokeless tobacco use includes snuff. She reports current alcohol use. She reports that she does not use drugs.  Allergies:  Allergies  Allergen Reactions   Penicillins Rash    Reaction to injection / not tablets    Medications: I have reviewed the patient's current medications.  No results found for this or any previous visit (from the past 48 hour(s)).  No results found.  ROS - all of the below systems have been reviewed with the patient and positives are indicated with bold text General: chills, fever or night sweats Eyes: blurry vision or double vision ENT: epistaxis or sore throat Allergy/Immunology: itchy/watery eyes or nasal congestion Hematologic/Lymphatic: bleeding problems, blood clots or swollen lymph nodes Endocrine: temperature intolerance or unexpected weight changes Breast: new or changing breast lumps or nipple discharge Resp: cough, shortness of breath, or wheezing CV: chest pain or dyspnea on exertion GI: as per HPI GU: dysuria, trouble voiding, or hematuria MSK: joint pain or joint stiffness Neuro: TIA or stroke symptoms Derm: pruritus and skin lesion changes Psych: anxiety and depression  PE  Blood pressure (!) 163/89, pulse 87, resp. rate 16, SpO2 100 %. Constitutional: NAD; conversant; wearing mask Eyes: Moist conjunctiva Lungs: Normal respiratory effort CV: irreg rate/rhythm; no pitting edema GI: Abd soft, NT/ND; no palpable hepatosplenomegaly MSK: Normal range of motion of extremities Psychiatric: Appropriate affect; alert and oriented x3  No results found for this or any previous visit (from the past 48  hour(s)).  No results found.   A/P: Kelsey Bridges is a very pleasant 70 y.o. female with hx of HTN, GERD, afib (on Eliquis) here for surgery for her recently diagnosed colon cancer at the junction of the descending and sigmoid colon.  -Cardiac clearance with Dr. Gwenlyn Found obtained; she has held Eliquis x 3 days -The anatomy and physiology of the GI tract was reviewed with the patient. The pathophysiology of colon cancer was discussed as well with associated pictures. -We have discussed various different treatment options going forward including surgery (the most definitive) to address this -robotic assisted left hemicolectomy, flexible sigmoidoscopy, intraoperative assessment of perfusion (ICG) -The planned procedures, material risks (including, but not limited to, pain, bleeding, infection, scarring, need for blood transfusion, damage to surrounding structures- blood vessels/nerves/viscus/organs, damage to ureter, urine leak, leak from anastomosis, need for additional procedures, scenarios where a stoma may be necessary and where it may even be permanent, worsening of pre-existing medical conditions, hernia, recurrence, pneumonia, heart attack, stroke, death) benefits and alternatives to surgery were discussed at length. The patient's questions were answered to her satisfaction, she voiced understanding and elected to proceed with surgery. Additionally, we discussed typical postoperative expectations and the recovery process.  Nadeen Landau, MD [facility] Use [email or web address] to contact on call provider

## 2021-07-17 NOTE — Anesthesia Procedure Notes (Signed)
Procedure Name: Intubation Date/Time: 07/17/2021 10:58 AM Performed by: Victoriano Lain, CRNA Pre-anesthesia Checklist: Patient identified, Emergency Drugs available, Suction available, Patient being monitored and Timeout performed Patient Re-evaluated:Patient Re-evaluated prior to induction Oxygen Delivery Method: Circle system utilized Preoxygenation: Pre-oxygenation with 100% oxygen Induction Type: IV induction Ventilation: Oral airway inserted - appropriate to patient size and Mask ventilation without difficulty Laryngoscope Size: Mac and 4 Grade View: Grade II Tube type: Oral Tube size: 7.0 mm Number of attempts: 1 Airway Equipment and Method: Stylet Placement Confirmation: ETT inserted through vocal cords under direct vision, positive ETCO2 and breath sounds checked- equal and bilateral Secured at: 21 cm Tube secured with: Tape Dental Injury: Teeth and Oropharynx as per pre-operative assessment

## 2021-07-17 NOTE — Anesthesia Postprocedure Evaluation (Signed)
Anesthesia Post Note  Patient: Kelsey Bridges  Procedure(s) Performed: ROBOTIC LEFT HEMICOLECTOMY, URETEROLYSIS, TAKEDOWN OF SPLENIC FLEXURE, LYSIS OF ADHESIONS X 90 MIN, REPAIR OF SMALL BOWEL, REPAIR OF COLON (Left: Abdomen) FLEXIBLE SIGMOIDOSCOPY (Rectum) INDOCYANINE GREEN FLUORESCENCE IMAGING (ICG), INTRAOPERATIVE ASSESSMENT OF PERFUSION ICG (Abdomen)     Patient location during evaluation: PACU Anesthesia Type: General Level of consciousness: awake and alert and oriented Pain management: pain level controlled Vital Signs Assessment: post-procedure vital signs reviewed and stable Respiratory status: spontaneous breathing, nonlabored ventilation, respiratory function stable and patient connected to nasal cannula oxygen Cardiovascular status: blood pressure returned to baseline and stable Postop Assessment: no apparent nausea or vomiting Anesthetic complications: no   No notable events documented.                 Chemika Nightengale A.

## 2021-07-18 ENCOUNTER — Encounter (HOSPITAL_COMMUNITY): Payer: Self-pay | Admitting: Surgery

## 2021-07-18 ENCOUNTER — Other Ambulatory Visit: Payer: Self-pay

## 2021-07-18 DIAGNOSIS — E440 Moderate protein-calorie malnutrition: Secondary | ICD-10-CM | POA: Insufficient documentation

## 2021-07-18 LAB — CBC
HCT: 26.3 % — ABNORMAL LOW (ref 36.0–46.0)
Hemoglobin: 8.4 g/dL — ABNORMAL LOW (ref 12.0–15.0)
MCH: 25.8 pg — ABNORMAL LOW (ref 26.0–34.0)
MCHC: 31.9 g/dL (ref 30.0–36.0)
MCV: 80.7 fL (ref 80.0–100.0)
Platelets: 246 10*3/uL (ref 150–400)
RBC: 3.26 MIL/uL — ABNORMAL LOW (ref 3.87–5.11)
RDW: 20.4 % — ABNORMAL HIGH (ref 11.5–15.5)
WBC: 9.7 10*3/uL (ref 4.0–10.5)
nRBC: 0 % (ref 0.0–0.2)

## 2021-07-18 LAB — BASIC METABOLIC PANEL
Anion gap: 6 (ref 5–15)
BUN: 13 mg/dL (ref 8–23)
CO2: 23 mmol/L (ref 22–32)
Calcium: 9 mg/dL (ref 8.9–10.3)
Chloride: 107 mmol/L (ref 98–111)
Creatinine, Ser: 1.27 mg/dL — ABNORMAL HIGH (ref 0.44–1.00)
GFR, Estimated: 45 mL/min — ABNORMAL LOW (ref >60)
Glucose, Bld: 139 mg/dL — ABNORMAL HIGH (ref 70–99)
Potassium: 4.2 mmol/L (ref 3.5–5.1)
Sodium: 136 mmol/L (ref 135–145)

## 2021-07-18 LAB — GLUCOSE, CAPILLARY
Glucose-Capillary: 126 mg/dL — ABNORMAL HIGH (ref 70–99)
Glucose-Capillary: 135 mg/dL — ABNORMAL HIGH (ref 70–99)
Glucose-Capillary: 129 mg/dL — ABNORMAL HIGH (ref 70–99)

## 2021-07-18 MED ORDER — TRAMADOL HCL 50 MG PO TABS
50.0000 mg | ORAL_TABLET | Freq: Four times a day (QID) | ORAL | 0 refills | Status: AC | PRN
Start: 2021-07-18 — End: 2021-07-23

## 2021-07-18 MED ORDER — ENSURE ENLIVE PO LIQD
237.0000 mL | Freq: Two times a day (BID) | ORAL | Status: DC
  Administered 2021-07-19 – 2021-07-22 (×5): 237 mL via ORAL

## 2021-07-18 MED ORDER — ADULT MULTIVITAMIN W/MINERALS CH
1.0000 | ORAL_TABLET | Freq: Every day | ORAL | Status: DC
  Administered 2021-07-18 – 2021-07-22 (×5): 1 via ORAL
  Filled 2021-07-18 (×4): qty 1

## 2021-07-18 MED ORDER — LACTATED RINGERS IV BOLUS
500.0000 mL | Freq: Once | INTRAVENOUS | Status: AC
  Administered 2021-07-18: 500 mL via INTRAVENOUS

## 2021-07-18 NOTE — Evaluation (Signed)
Occupational Therapy Evaluation Patient Details Name: Kelsey Bridges MRN: [id number] DOB: 1951/03/26 Today's Date: 07/18/2021   History of Present Illness Patient is a 70 year old female with colon cancer s/p L hemicolectomy. PMH includes  HTN, GERD, afib   Clinical Impression   Patient typically lives alone in an apartment, reports she can stay with daughter in her house if needed at D/C. Patient educated in strategies to minimize abdominal strain during self care tasks, able to return demonstrate figure 4 to doff/don socks with increased time. Also able to ambulate in room with rolling walker without physical assistance, does not use walker at baseline. Instruct patient that if still using in hospital with nursing staff for ambulation in hallways at time of D/C to follow up with PT/case management to order one. Patient does not have any further questions/concerns for OT. Encouraged continued mobility/ambulation with nursing staff during acute stay. No further acute OT needs, will sign off.      Recommendations for follow up therapy are one component of a multi-disciplinary discharge planning process, led by the attending physician.  Recommendations may be updated based on patient status, additional functional criteria and insurance authorization.   Follow Up Recommendations  No OT follow up    Equipment Recommendations  Other (comment) (possibly rolling walker)       Precautions / Restrictions Precautions Precaution Comments: abd sx Restrictions Weight Bearing Restrictions: No      Mobility Bed Mobility               General bed mobility comments: OOB upoin arrival    Transfers Overall transfer level: Modified independent Equipment used: Rolling walker (2 wheeled)             General transfer comment: does not need physical assistance with sit to stand from recliner chair    Balance Overall balance assessment: Modified Independent                                          ADL either performed or assessed with clinical judgement   ADL Overall ADL's : Modified independent                                       General ADL Comments: Patient able to demonstrate figure 4 method for doff/donning socks with increased time. Educated patient on how to minimize abdominal strain during self care tasks, pt verbalize understanding. Using rolling walker patient able to ambulate in her room without physical assistance. Reports has been up walking with nursing in hallway already. Patient does not use walker at baseline, educate patient that if still feeling a little unsteady at time of D/C ask case management/MD re: obtaining walker      Pertinent Vitals/Pain Pain Assessment: Faces Faces Pain Scale: Hurts a little bit Pain Location: abdomen Pain Descriptors / Indicators: Sore Pain Intervention(s): Monitored during session     Hand Dominance Right   Extremity/Trunk Assessment Upper Extremity Assessment Upper Extremity Assessment: Overall WFL for tasks assessed   Lower Extremity Assessment Lower Extremity Assessment: Defer to PT evaluation       Communication Communication Communication: No difficulties   Cognition Arousal/Alertness: Awake/alert Behavior During Therapy: WFL for tasks assessed/performed Overall Cognitive Status: Within Functional Limits for tasks assessed  Home Living Family/patient expects to be discharged to:: Private residence Living Arrangements: Alone Available Help at Discharge: Family;Available PRN/intermittently;Neighbor Type of Home: House Home Access: Stairs to enter [employer] of Steps: 1   Home Layout: Two level;Able to live on main level with bedroom/bathroom;1/2 bath on main level         Bathroom Toilet: Standard     Home Equipment: Cane - single point;Other (comment) (life line)   Additional  Comments: may stay with DTR at D/C, DTR info above      Prior Functioning/Environment Level of Independence: Independent                 OT Problem List: Decreased activity tolerance;Impaired balance (sitting and/or standing);Pain         OT Goals(Current goals can be found in the care plan section) Acute Rehab OT Goals Patient Stated Goal: go home if I can by myself; can stay with DTR OT Goal Formulation: All assessment and education complete, DC therapy   AM-PAC OT "6 Clicks" Daily Activity     Outcome Measure Help from another person eating meals?: None Help from another person taking care of personal grooming?: None Help from another person toileting, which includes using toliet, bedpan, or urinal?: None Help from another person bathing (including washing, rinsing, drying)?: None Help from another person to put on and taking off regular upper body clothing?: None Help from another person to put on and taking off regular lower body clothing?: None 6 Click Score: 24   End of Session Equipment Utilized During Treatment: Rolling walker Nurse Communication: Mobility status  Activity Tolerance: Patient tolerated treatment well Patient left: in chair;with call bell/phone within reach  OT Visit Diagnosis: Pain Pain - part of body:  (abdomen)                Time: 5329-9242 OT Time Calculation (min): 19 min Charges:  OT General Charges $OT Visit: 1 Visit OT Evaluation $OT Eval Low Complexity: [place] OT OT pager: [place] 07/18/2021, 12:45 PM

## 2021-07-18 NOTE — Progress Notes (Addendum)
Subjective No acute events. Feeling quite well. Getting up to mobilize this morning. Tolerating clear liquids without any n/v. Has 2 drank 2 24 oz bottles of water. Denies distention, having flatus.  Objective: Vital signs in last 24 hours: Temp:  [97.5 F (36.4 C)-99.1 F (37.3 C)] 99.1 F (37.3 C) (10/07 0622) Pulse Rate:  [80-93] 80 (10/07 0622) Resp:  [16-22] 16 (10/07 0622) BP: (155-178)/(86-102) 156/92 (10/07 0622) SpO2:  [90 %-100 %] 100 % (10/07 0622) Last BM Date: 07/17/21  Intake/Output from previous day: 10/06 0701 - 10/07 0700 In: 3725.5 [I.V.:3568.5; IV Piggyback:157] Out: 8466 [Urine:1525; Blood:150] Intake/Output this shift: No intake/output data recorded.  Gen: NAD, comfortable CV: RRR Pulm: Normal work of breathing Abd: Soft, minimally tender, nondistended. Wounds clean and dry. Ext: SCDs in place  Lab Results: CBC  Recent Labs    07/18/21 0440  WBC 9.7  HGB 8.4*  HCT 26.3*  PLT 246   BMET Recent Labs    07/18/21 0440  NA 136  K 4.2  CL 107  CO2 23  GLUCOSE 139*  BUN 13  CREATININE 1.27*  CALCIUM 9.0   PT/INR No results for input(s): LABPROT, INR in the last 72 hours. ABG No results for input(s): PHART, HCO3 in the last 72 hours.  Invalid input(s): PCO2, PO2  Studies/Results:  Anti-infectives: Anti-infectives (From admission, onward)    Start     Dose/Rate Route Frequency Ordered Stop   07/17/21 1400  neomycin (MYCIFRADIN) tablet 1,000 mg  Status:  Discontinued       See Hyperspace for full Linked Orders Report.   1,000 mg Oral 3 times per day 07/17/21 0926 07/17/21 0934   07/17/21 1400  metroNIDAZOLE (FLAGYL) tablet 1,000 mg  Status:  Discontinued       See Hyperspace for full Linked Orders Report.   1,000 mg Oral 3 times per day 07/17/21 0926 07/17/21 0934   07/17/21 0600  clindamycin (CLEOCIN) IVPB 900 mg       See Hyperspace for full Linked Orders Report.   900 mg 100 mL/hr over 30 Minutes Intravenous 60 min pre-op  07/16/21 0756 07/17/21 1129   07/17/21 0600  gentamicin (GARAMYCIN) 280 mg in dextrose 5 % 100 mL IVPB       See Hyperspace for full Linked Orders Report.   5 mg/kg  56.2 kg 214 mL/hr over 30 Minutes Intravenous 60 min pre-op 07/16/21 0756 07/17/21 1216        Assessment/Plan: Patient Active Problem List   Diagnosis Date Noted   S/P left hemicolectomy 07/17/2021   Mitral regurgitation 06/04/2021   Essential hypertension 05/15/2020   Paroxysmal atrial fibrillation ([place]) 05/15/2020   Hyperlipidemia 05/15/2020   Tobacco abuse 05/15/2020   LSIL (low grade squamous intraepithelial lesion) on Pap smear 06/08/2011   s/p Procedure(s): ROBOTIC LEFT HEMICOLECTOMY, URETEROLYSIS, TAKEDOWN OF SPLENIC FLEXURE, LYSIS OF ADHESIONS X 90 MIN, REPAIR OF SMALL BOWEL, REPAIR OF COLON FLEXIBLE SIGMOIDOSCOPY INDOCYANINE GREEN FLUORESCENCE IMAGING (ICG), INTRAOPERATIVE ASSESSMENT OF PERFUSION ICG 07/17/2021  -She is doing quite well -Continue entereg today. -Full liquids, advance to soft as tolerated -D/C Foley -Continue MIVF today until reliably tolerating PO -Cr 1.27; 500 cc LR given, repeat labs tomorrow -Hgb 8.4; monitor - acute on chronic blood loss anemia, expected given procedure. Continue home iron as she is anemic at baseline related to colon cancer -Hx Afib - on Eliquis at home; will plan to restart 10/8 vs 10/9 based on clinical condition, hgb stability -Ppx: SQH, SCDs  LOS: 1 day   Nadeen Landau, MD [facility] Use [email or web address] to contact on call provider

## 2021-07-18 NOTE — Progress Notes (Signed)
Initial Nutrition Assessment  DOCUMENTATION CODES:  Non-severe (moderate) malnutrition in context of chronic illness  INTERVENTION:  Continue to advance diet as medically able and as tolerated.  Discontinue Ensure Surgery BID.  Add Ensure Plus High Protein po BID, each supplement provides 350 kcal and 20 grams of protein.  Add Magic cup TID with meals, each supplement provides 290 kcal and 9 grams of protein.  Add MVI with minerals daily.  Obtain updated weight.  Encourage PO intake.  NUTRITION DIAGNOSIS:  Moderate Malnutrition related to chronic illness, cancer and cancer related treatments as evidenced by moderate fat depletion, moderate muscle depletion, percent weight loss.  GOAL:  Patient will meet greater than or equal to 90% of their needs  MONITOR:  Diet advancement, PO intake, Supplement acceptance, Labs, Weight trends, Skin, I & O's  REASON FOR ASSESSMENT:  Malnutrition Screening Tool    ASSESSMENT:  70 yo female with a PMH of A-fib, HTN, and GERD. Admitted with colon cancer. 10/6 - L hemicolectomy with colorectal anastomosis  Spoke with pt at bedside. Pt in good spirits. She reports feeling well after surgery and is excited for a real meal soon. Pt had 100% of a full liquid breakfast this morning and lunch this afternoon.  Per Epic, pt has lost ~13 lbs (9.5%) in the last 5 months, which is significant and severe for the time frame. Pt also needs new admission weight - RD to order.  Recommend adding Ensure BID and Magic Cup TID, as well as MVI with minerals. Discontinue Ensure Surgery BID.  Supplements: Ensure Surgery BID  Medications: reviewed; EnterG, ferrous sulfate, SSI, Protonix, LR @ 75 ml/hr  Labs: reviewed; CBG 126-196 (H)  NUTRITION - FOCUSED PHYSICAL EXAM: Flowsheet Row Most Recent Value  Orbital Region Moderate depletion  Upper Arm Region Moderate depletion  Thoracic and Lumbar Region Mild depletion  Buccal Region Moderate depletion  Temple  Region Mild depletion  Clavicle Bone Region Mild depletion  Clavicle and Acromion Bone Region Mild depletion  Scapular Bone Region Unable to assess  Dorsal Hand Mild depletion  Patellar Region Moderate depletion  Anterior Thigh Region Moderate depletion  Posterior Calf Region Moderate depletion  Edema (RD Assessment) None  Hair Reviewed  Eyes Reviewed  Mouth Reviewed  Skin Reviewed  Nails Reviewed   Diet Order:   Diet Order             Diet full liquid Room service appropriate? Yes; Fluid consistency: Thin  Diet effective now                  EDUCATION NEEDS:  Education needs have been addressed  Skin:  Skin Assessment: Skin Integrity Issues: Skin Integrity Issues:: Incisions Incisions: Abdomen, closed  Last BM:  07/17/21 - prior to surgery  Height:  Ht Readings from Last 1 Encounters:  07/07/21 5\' 1"  (1.549 m)   Weight:  Wt Readings from Last 1 Encounters:  07/07/21 56.2 kg   BMI:  There is no height or weight on file to calculate BMI.  Estimated Nutritional Needs:  Kcal:  1900-2100 Protein:  75-90 grams Fluid:  >1.9 L  Derrel Nip, RD, LDN (she/her/hers) Registered Dietitian I After-Hours/Weekend Pager # in [place]

## 2021-07-18 NOTE — Evaluation (Signed)
Physical Therapy Evaluation Patient Details Name: Kelsey Bridges MRN: [id number] DOB: 04/04/1951 Today's Date: 07/18/2021  History of Present Illness  Patient is a 70 year old female with colon cancer s/p L hemicolectomy on 07/17/21. PMH includes  HTN, GERD, afib  Clinical Impression  Pt admitted with above diagnosis. At baseline, pt is independent and lives alone.  She occasionally uses a cane due to R knee pain.  Today, pt requiring supervision-min guard for mobility with min cues.  She is very motivated to ambulate and has walked 2-3x already today.  Will benefit from PT to progress toward baseline.  Pt currently with functional limitations due to the deficits listed below (see PT Problem List). Pt will benefit from skilled PT to increase their independence and safety with mobility to allow discharge to the venue listed below.          Recommendations for follow up therapy are one component of a multi-disciplinary discharge planning process, led by the attending physician.  Recommendations may be updated based on patient status, additional functional criteria and insurance authorization.  Follow Up Recommendations Supervision - Intermittent;No PT follow up    Equipment Recommendations  Rolling walker with 5" wheels    Recommendations for Other Services       Precautions / Restrictions Precautions Precaution Comments: abd sx Restrictions Weight Bearing Restrictions: No      Mobility  Bed Mobility               General bed mobility comments: OOB upoin arrival    Transfers Overall transfer level: Needs assistance Equipment used: Rolling walker (2 wheeled) Transfers: Sit to/from Stand Sit to Stand: Supervision         General transfer comment: supervision for safety  Ambulation/Gait Ambulation/Gait assistance: Supervision Gait Distance (Feet): 80 Feet Assistive device: Rolling walker (2 wheeled) Gait Pattern/deviations: Step-to pattern;Decreased stride  length Gait velocity: decreased   General Gait Details: Min cues for RW proximity and posture  Stairs            Wheelchair Mobility    Modified Rankin (Stroke Patients Only)       Balance Overall balance assessment: Needs assistance   Sitting balance-Leahy Scale: Normal       Standing balance-Leahy Scale: Fair Standing balance comment: RW to ambulate but can static stand no AD                             Pertinent Vitals/Pain Pain Assessment: 0-10 Pain Score: 7  Faces Pain Scale: Hurts a little bit Pain Location: abdomen Pain Descriptors / Indicators: Sore Pain Intervention(s): Limited activity within patient's tolerance;Monitored during session;Patient requesting pain meds-RN notified    Home Living Family/patient expects to be discharged to:: Private residence Living Arrangements: Alone Available Help at Discharge: Family;Available PRN/intermittently;Neighbor Type of Home: House Home Access: Stairs to enter   [employer] of Steps: 1 Home Layout: Two level;Able to live on main level with bedroom/bathroom;1/2 bath on main level Home Equipment: Cane - single point;Other (comment) (life line) Additional Comments: may stay with DTR at D/C, DTR info above ; if pt goes home to her apt she has elevator and no steps    Prior Function Level of Independence: Independent         Comments: Reports arthritis R knee and was seeing Dr. Alvan Dame for injections - occasionally uses cane     Hand Dominance   Dominant Hand: Right    Extremity/Trunk  Assessment   Upper Extremity Assessment Upper Extremity Assessment: Overall WFL for tasks assessed    Lower Extremity Assessment Lower Extremity Assessment: Overall WFL for tasks assessed    Cervical / Trunk Assessment Cervical / Trunk Assessment: Normal  Communication   Communication: No difficulties  Cognition Arousal/Alertness: Awake/alert Behavior During Therapy: WFL for tasks  assessed/performed Overall Cognitive Status: Within Functional Limits for tasks assessed                                        General Comments      Exercises     Assessment/Plan    PT Assessment Patient needs continued PT services  PT Problem List Decreased strength;Decreased mobility;Decreased activity tolerance;Decreased balance;Decreased knowledge of use of DME;Pain       PT Treatment Interventions DME instruction;Therapeutic activities;Gait training;Therapeutic exercise;Patient/family education;Balance training;Stair training;Functional mobility training    PT Goals (Current goals can be found in the Care Plan section)  Acute Rehab PT Goals Patient Stated Goal: go home if I can by myself; can stay with DTR if needed PT Goal Formulation: With patient Time For Goal Achievement: 08/01/21 Potential to Achieve Goals: Good    Frequency Min 3X/week   Barriers to discharge        Co-evaluation               AM-PAC PT "6 Clicks" Mobility  Outcome Measure Help needed turning from your back to your side while in a flat bed without using bedrails?: A Little Help needed moving from lying on your back to sitting on the side of a flat bed without using bedrails?: A Little Help needed moving to and from a bed to a chair (including a wheelchair)?: A Little Help needed standing up from a chair using your arms (e.g., wheelchair or bedside chair)?: A Little Help needed to walk in hospital room?: A Little Help needed climbing 3-5 steps with a railing? : A Little 6 Click Score: 18    End of Session   Activity Tolerance: Patient tolerated treatment well Patient left: in chair;with call bell/phone within reach Nurse Communication: Mobility status PT Visit Diagnosis: Other abnormalities of gait and mobility (R26.89)    Time: 4707-6151 PT Time Calculation (min) (ACUTE ONLY): 25 min   Charges:   PT Evaluation $PT Eval Low Complexity: 1 Low PT  Treatments $Gait Training: 8-22 mins        Abran Richard, PT Acute Rehab Services Pager [phone] [facility] [place] 07/18/2021, 2:02 PM

## 2021-07-19 LAB — BASIC METABOLIC PANEL
Anion gap: 9 (ref 5–15)
BUN: 15 mg/dL (ref 8–23)
CO2: 23 mmol/L (ref 22–32)
Calcium: 8.5 mg/dL — ABNORMAL LOW (ref 8.9–10.3)
Chloride: 104 mmol/L (ref 98–111)
Creatinine, Ser: 1.2 mg/dL — ABNORMAL HIGH (ref 0.44–1.00)
GFR, Estimated: 48 mL/min — ABNORMAL LOW (ref >60)
Glucose, Bld: 114 mg/dL — ABNORMAL HIGH (ref 70–99)
Potassium: 3.7 mmol/L (ref 3.5–5.1)
Sodium: 136 mmol/L (ref 135–145)

## 2021-07-19 LAB — CBC
HCT: 25 % — ABNORMAL LOW (ref 36.0–46.0)
Hemoglobin: 7.8 g/dL — ABNORMAL LOW (ref 12.0–15.0)
MCH: 24.6 pg — ABNORMAL LOW (ref 26.0–34.0)
MCHC: 31.2 g/dL (ref 30.0–36.0)
MCV: 78.9 fL — ABNORMAL LOW (ref 80.0–100.0)
Platelets: 256 10*3/uL (ref 150–400)
RBC: 3.17 MIL/uL — ABNORMAL LOW (ref 3.87–5.11)
RDW: 20.7 % — ABNORMAL HIGH (ref 11.5–15.5)
WBC: 8.3 10*3/uL (ref 4.0–10.5)
nRBC: 0 % (ref 0.0–0.2)

## 2021-07-19 LAB — GLUCOSE, CAPILLARY
Glucose-Capillary: 131 mg/dL — ABNORMAL HIGH (ref 70–99)
Glucose-Capillary: 113 mg/dL — ABNORMAL HIGH (ref 70–99)
Glucose-Capillary: 149 mg/dL — ABNORMAL HIGH (ref 70–99)
Glucose-Capillary: 140 mg/dL — ABNORMAL HIGH (ref 70–99)
Glucose-Capillary: 135 mg/dL — ABNORMAL HIGH (ref 70–99)

## 2021-07-19 MED ORDER — POTASSIUM CHLORIDE 20 MEQ PO PACK
20.0000 meq | PACK | Freq: Two times a day (BID) | ORAL | Status: DC
  Administered 2021-07-19 – 2021-07-22 (×7): 20 meq via ORAL
  Filled 2021-07-19 (×7): qty 1

## 2021-07-19 MED ORDER — TRAZODONE HCL 50 MG PO TABS
50.0000 mg | ORAL_TABLET | Freq: Every evening | ORAL | Status: DC | PRN
  Administered 2021-07-19 – 2021-07-21 (×3): 50 mg via ORAL
  Filled 2021-07-19 (×3): qty 1

## 2021-07-19 NOTE — Progress Notes (Signed)
Mobility Specialist - Progress Note    07/19/21 1028  Mobility  Activity Ambulated in hall  Level of Assistance Minimal assist, patient does 75% or more  Assistive Device Front wheel walker  Distance Ambulated (ft) 80 ft  Mobility Ambulated with assistance in hallway  Mobility Response Tolerated well  Mobility performed by Mobility specialist  $Mobility charge 1 Mobility   Upon entry pt agreeable to ambulate and required Min A to stand from recliner. Used RW while walking 80 ft in hallway and c/o of abdominal pain due to her muscle spasms. Pt required 1 standing rest break during session and was returned to room. Pt left in recliner with call bell at side. No other complaints at this time.   [place] Specialist Acute Rehabilitation Services Phone: [phone] 07/19/21, 10:30 AM

## 2021-07-19 NOTE — Progress Notes (Signed)
Subjective Feeling well. Tolerating full liquids without issue. Having bowel movements. Requesting trazodone for sleep at night.   Objective: Vital signs in last 24 hours: Temp:  [98.5 F (36.9 C)-99.1 F (37.3 C)] 98.7 F (37.1 C) (10/08 0557) Pulse Rate:  [71-87] 71 (10/08 0557) Resp:  [17-18] 18 (10/08 0557) BP: (133-175)/(81-96) 175/87 (10/08 0557) SpO2:  [94 %-99 %] 97 % (10/08 0557) Weight:  [56.4 kg] 56.4 kg (10/08 0557) Last BM Date: 07/18/21  Intake/Output from previous day: 10/07 0701 - 10/08 0700 In: 800 [P.O.:800] Out: 1400 [Urine:1400] Intake/Output this shift: No intake/output data recorded.  Gen: NAD, comfortable CV: RRR Pulm: Normal work of breathing Abd: Soft, minimally tender, nondistended. Wounds clean and dry. Ext: SCDs in place  Lab Results: CBC  Recent Labs    07/18/21 0440 07/19/21 0518  WBC 9.7 8.3  HGB 8.4* 7.8*  HCT 26.3* 25.0*  PLT 246 256   BMET Recent Labs    07/18/21 0440 07/19/21 0518  NA 136 136  K 4.2 3.7  CL 107 104  CO2 23 23  GLUCOSE 139* 114*  BUN 13 15  CREATININE 1.27* 1.20*  CALCIUM 9.0 8.5*   PT/INR No results for input(s): LABPROT, INR in the last 72 hours. ABG No results for input(s): PHART, HCO3 in the last 72 hours.  Invalid input(s): PCO2, PO2  Studies/Results:  Anti-infectives: Anti-infectives (From admission, onward)    Start     Dose/Rate Route Frequency Ordered Stop   07/17/21 1400  neomycin (MYCIFRADIN) tablet 1,000 mg  Status:  Discontinued       See Hyperspace for full Linked Orders Report.   1,000 mg Oral 3 times per day 07/17/21 0926 07/17/21 0934   07/17/21 1400  metroNIDAZOLE (FLAGYL) tablet 1,000 mg  Status:  Discontinued       See Hyperspace for full Linked Orders Report.   1,000 mg Oral 3 times per day 07/17/21 0926 07/17/21 0934   07/17/21 0600  clindamycin (CLEOCIN) IVPB 900 mg       See Hyperspace for full Linked Orders Report.   900 mg 100 mL/hr over 30 Minutes Intravenous 60  min pre-op 07/16/21 0756 07/17/21 1129   07/17/21 0600  gentamicin (GARAMYCIN) 280 mg in dextrose 5 % 100 mL IVPB       See Hyperspace for full Linked Orders Report.   5 mg/kg  56.2 kg 214 mL/hr over 30 Minutes Intravenous 60 min pre-op 07/16/21 0756 07/17/21 1216        Assessment/Plan: Patient Active Problem List   Diagnosis Date Noted   Malnutrition of moderate degree 07/18/2021   S/P left hemicolectomy 07/17/2021   Mitral regurgitation 06/04/2021   Essential hypertension 05/15/2020   Paroxysmal atrial fibrillation ([place]) 05/15/2020   Hyperlipidemia 05/15/2020   Tobacco abuse 05/15/2020   LSIL (low grade squamous intraepithelial lesion) on Pap smear 06/08/2011   s/p Procedure(s): ROBOTIC LEFT HEMICOLECTOMY, URETEROLYSIS, TAKEDOWN OF SPLENIC FLEXURE, LYSIS OF ADHESIONS X 90 MIN, REPAIR OF SMALL BOWEL, REPAIR OF COLON FLEXIBLE SIGMOIDOSCOPY INDOCYANINE GREEN FLUORESCENCE IMAGING (ICG), INTRAOPERATIVE ASSESSMENT OF PERFUSION ICG 07/17/2021  -Progressing well.  -Advance to soft diet, stop entereg -Stop IVF -Cr 1.20; stable/slightly down trending -Hgb 7.8 (8.4yest) monitor - acute on chronic blood loss anemia, expected given procedure. Continue home iron as she is anemic at baseline related to colon cancer -Hx Afib - on Eliquis at home; will plan to restart 10/9 based on clinical condition, hgb stability -Ppx: SQH, SCDs   LOS: 2 days  [facility] Use [email or web address] to contact on call provider

## 2021-07-20 ENCOUNTER — Inpatient Hospital Stay (HOSPITAL_COMMUNITY): Payer: Medicare HMO

## 2021-07-20 LAB — BASIC METABOLIC PANEL
Anion gap: 10 (ref 5–15)
BUN: 18 mg/dL (ref 8–23)
CO2: 23 mmol/L (ref 22–32)
Calcium: 9 mg/dL (ref 8.9–10.3)
Chloride: 103 mmol/L (ref 98–111)
Creatinine, Ser: 1.08 mg/dL — ABNORMAL HIGH (ref 0.44–1.00)
GFR, Estimated: 55 mL/min — ABNORMAL LOW (ref >60)
Glucose, Bld: 166 mg/dL — ABNORMAL HIGH (ref 70–99)
Potassium: 4 mmol/L (ref 3.5–5.1)
Sodium: 136 mmol/L (ref 135–145)

## 2021-07-20 LAB — GLUCOSE, CAPILLARY
Glucose-Capillary: 121 mg/dL — ABNORMAL HIGH (ref 70–99)
Glucose-Capillary: 134 mg/dL — ABNORMAL HIGH (ref 70–99)
Glucose-Capillary: 100 mg/dL — ABNORMAL HIGH (ref 70–99)
Glucose-Capillary: 123 mg/dL — ABNORMAL HIGH (ref 70–99)

## 2021-07-20 LAB — CBC
HCT: 30.6 % — ABNORMAL LOW (ref 36.0–46.0)
Hemoglobin: 9.6 g/dL — ABNORMAL LOW (ref 12.0–15.0)
MCH: 25.3 pg — ABNORMAL LOW (ref 26.0–34.0)
MCHC: 31.4 g/dL (ref 30.0–36.0)
MCV: 80.7 fL (ref 80.0–100.0)
Platelets: 322 10*3/uL (ref 150–400)
RBC: 3.79 MIL/uL — ABNORMAL LOW (ref 3.87–5.11)
RDW: 20.9 % — ABNORMAL HIGH (ref 11.5–15.5)
WBC: 9 10*3/uL (ref 4.0–10.5)
nRBC: 0 % (ref 0.0–0.2)

## 2021-07-20 LAB — TROPONIN I (HIGH SENSITIVITY)
Troponin I (High Sensitivity): 4 ng/L (ref <18)
Troponin I (High Sensitivity): 5 ng/L (ref <18)

## 2021-07-20 MED ORDER — APIXABAN 5 MG PO TABS
5.0000 mg | ORAL_TABLET | Freq: Two times a day (BID) | ORAL | Status: DC
  Administered 2021-07-20 – 2021-07-22 (×5): 5 mg via ORAL
  Filled 2021-07-20 (×5): qty 1

## 2021-07-20 MED ORDER — METOPROLOL TARTRATE 5 MG/5ML IV SOLN
5.0000 mg | INTRAVENOUS | Status: AC
  Administered 2021-07-20: 5 mg via INTRAVENOUS
  Filled 2021-07-20: qty 5

## 2021-07-20 MED ORDER — METOCLOPRAMIDE HCL 5 MG/ML IJ SOLN
5.0000 mg | Freq: Three times a day (TID) | INTRAMUSCULAR | Status: DC
  Administered 2021-07-20 – 2021-07-21 (×3): 5 mg via INTRAVENOUS
  Filled 2021-07-20 (×3): qty 2

## 2021-07-20 MED ORDER — APIXABAN 5 MG PO TABS: 5.0000 mg | ORAL_TABLET | Freq: Every morning | ORAL | Status: DC

## 2021-07-20 NOTE — Progress Notes (Addendum)
Subjective Some nausea this morning and palpitations.  Continues to have frequent small-volume loose bowel movements and intermittent abdominal wall spasm pain.  Objective: Vital signs in last 24 hours: Temp:  [97.8 F (36.6 C)-98.3 F (36.8 C)] 98 F (36.7 C) (10/09 0445) Pulse Rate:  [79-108] 106 (10/09 0637) Resp:  [15-16] 16 (10/09 0445) BP: (124-187)/(73-130) 170/110 (10/09 0637) SpO2:  [100 %] 100 % (10/09 0445) Weight:  [55.7 kg] 55.7 kg (10/09 0637) Last BM Date: 07/19/21  Intake/Output from previous day: 10/08 0701 - 10/09 0700 In: 1240 [P.O.:1240] Out: -  Intake/Output this shift: No intake/output data recorded.  Gen: NAD, comfortable CV: RRR Pulm: Normal work of breathing Abd: Soft, appropriately tender more so along the right hemiabdomen near port sites, nondistended.  Incisions clean dry and intact, no cellulitis, erythema, induration or drainage Ext: SCDs in place  Lab Results: CBC  Recent Labs    07/19/21 0518 07/20/21 0433  WBC 8.3 9.0  HGB 7.8* 9.6*  HCT 25.0* 30.6*  PLT 256 322    BMET Recent Labs    07/19/21 0518 07/20/21 0433  NA 136 136  K 3.7 4.0  CL 104 103  CO2 23 23  GLUCOSE 114* 166*  BUN 15 18  CREATININE 1.20* 1.08*  CALCIUM 8.5* 9.0    PT/INR No results for input(s): LABPROT, INR in the last 72 hours. ABG No results for input(s): PHART, HCO3 in the last 72 hours.  Invalid input(s): PCO2, PO2  Studies/Results:  Anti-infectives: Anti-infectives (From admission, onward)    Start     Dose/Rate Route Frequency Ordered Stop   07/17/21 1400  neomycin (MYCIFRADIN) tablet 1,000 mg  Status:  Discontinued       See Hyperspace for full Linked Orders Report.   1,000 mg Oral 3 times per day 07/17/21 0926 07/17/21 0934   07/17/21 1400  metroNIDAZOLE (FLAGYL) tablet 1,000 mg  Status:  Discontinued       See Hyperspace for full Linked Orders Report.   1,000 mg Oral 3 times per day 07/17/21 0926 07/17/21 0934   07/17/21 0600   clindamycin (CLEOCIN) IVPB 900 mg       See Hyperspace for full Linked Orders Report.   900 mg 100 mL/hr over 30 Minutes Intravenous 60 min pre-op 07/16/21 0756 07/17/21 1129   07/17/21 0600  gentamicin (GARAMYCIN) 280 mg in dextrose 5 % 100 mL IVPB       See Hyperspace for full Linked Orders Report.   5 mg/kg  56.2 kg 214 mL/hr over 30 Minutes Intravenous 60 min pre-op 07/16/21 0756 07/17/21 1216        Assessment/Plan: Patient Active Problem List   Diagnosis Date Noted   Malnutrition of moderate degree 07/18/2021   S/P left hemicolectomy 07/17/2021   Mitral regurgitation 06/04/2021   Essential hypertension 05/15/2020   Paroxysmal atrial fibrillation ([place]) 05/15/2020   Hyperlipidemia 05/15/2020   Tobacco abuse 05/15/2020   LSIL (low grade squamous intraepithelial lesion) on Pap smear 06/08/2011   s/p Procedure(s): ROBOTIC LEFT HEMICOLECTOMY, URETEROLYSIS, TAKEDOWN OF SPLENIC FLEXURE, LYSIS OF ADHESIONS X 90 MIN, REPAIR OF SMALL BOWEL, REPAIR OF COLON FLEXIBLE SIGMOIDOSCOPY INDOCYANINE GREEN FLUORESCENCE IMAGING (ICG), INTRAOPERATIVE ASSESSMENT OF PERFUSION ICG 07/17/2021  -Postop day 3, seems to be in A. Fib, hypertensive, hemoconcentrated likely recruiting third space fluid.  Check EKG, repeat vitals, check troponin -Continue soft diet, hold off on any IV fluids currently -Cr 1.08; down from 1.2 yesterday, preop baseline appears to be 0.99 -Hgb 9.6 (7.8<8.4<10.1)  monitor - acute on chronic blood loss anemia, expected given procedure.  Increase slightly from yesterday likely due to auto diuresis.  Continue home iron as she is anemic at baseline related to colon cancer -Hx Afib - on Eliquis at home; resume today -Ppx: SQH, SCDs   Addendum- ekg confirms afib, HR Low 100s. One time dose of metoprolol ordered as well as CXR. Troponin pending. Reglan added for nausea though suspect this is more related to her cardiac issues currently   LOS: 3 days   [facility] Use [email or web address] to contact on call provider

## 2021-07-20 NOTE — Progress Notes (Signed)
Reexamined this afternoon, she is feeling much better.  Reports her nausea is significantly improved, only has abdominal pain when she coughs or when she is trying to get up out of bed.  She has eaten most of her dinner and spent most of the day up in the chair.  Has done some walking today as well. Troponins were negative, chest x-ray without acute abnormality, pneumoperitoneum as expected.  Heart rate has returned 83, still irregular, normotensive, sats 100% on room air and she remains afebrile. Will continue current measures, suspect earlier A. fib with RVR secondary to third space fluid recruitment was the driver of her nausea and not feeling well and fortunately this seems to have resolved with just 1 dose of metoprolol.

## 2021-07-21 ENCOUNTER — Inpatient Hospital Stay (HOSPITAL_COMMUNITY): Payer: Medicare HMO

## 2021-07-21 DIAGNOSIS — Z7901 Long term (current) use of anticoagulants: Secondary | ICD-10-CM

## 2021-07-21 DIAGNOSIS — C186 Malignant neoplasm of descending colon: Secondary | ICD-10-CM

## 2021-07-21 LAB — BASIC METABOLIC PANEL
Anion gap: 5 (ref 5–15)
BUN: 13 mg/dL (ref 8–23)
CO2: 27 mmol/L (ref 22–32)
Calcium: 8.7 mg/dL — ABNORMAL LOW (ref 8.9–10.3)
Chloride: 107 mmol/L (ref 98–111)
Creatinine, Ser: 0.98 mg/dL (ref 0.44–1.00)
GFR, Estimated: >60 mL/min (ref >60)
Glucose, Bld: 125 mg/dL — ABNORMAL HIGH (ref 70–99)
Potassium: 4.1 mmol/L (ref 3.5–5.1)
Sodium: 139 mmol/L (ref 135–145)

## 2021-07-21 LAB — CBC
HCT: 27.1 % — ABNORMAL LOW (ref 36.0–46.0)
Hemoglobin: 8.5 g/dL — ABNORMAL LOW (ref 12.0–15.0)
MCH: 25.1 pg — ABNORMAL LOW (ref 26.0–34.0)
MCHC: 31.4 g/dL (ref 30.0–36.0)
MCV: 80.2 fL (ref 80.0–100.0)
Platelets: 298 10*3/uL (ref 150–400)
RBC: 3.38 MIL/uL — ABNORMAL LOW (ref 3.87–5.11)
RDW: 20.6 % — ABNORMAL HIGH (ref 11.5–15.5)
WBC: 6.7 10*3/uL (ref 4.0–10.5)
nRBC: 0 % (ref 0.0–0.2)

## 2021-07-21 LAB — MAGNESIUM: Magnesium: 1.9 mg/dL (ref 1.7–2.4)

## 2021-07-21 LAB — GLUCOSE, CAPILLARY
Glucose-Capillary: 114 mg/dL — ABNORMAL HIGH (ref 70–99)
Glucose-Capillary: 115 mg/dL — ABNORMAL HIGH (ref 70–99)
Glucose-Capillary: 165 mg/dL — ABNORMAL HIGH (ref 70–99)
Glucose-Capillary: 87 mg/dL (ref 70–99)

## 2021-07-21 MED ORDER — METOCLOPRAMIDE HCL 5 MG/ML IJ SOLN: 5.0000 mg | Freq: Three times a day (TID) | INTRAMUSCULAR | Status: DC | PRN

## 2021-07-21 MED ORDER — METHOCARBAMOL 500 MG PO TABS: 1000.0000 mg | ORAL_TABLET | Freq: Four times a day (QID) | ORAL | Status: DC | PRN

## 2021-07-21 MED ORDER — SERTRALINE HCL 100 MG PO TABS: 100.0000 mg | ORAL_TABLET | Freq: Every day | ORAL | Status: DC | PRN

## 2021-07-21 MED ORDER — MAGIC MOUTHWASH
15.0000 mL | Freq: Four times a day (QID) | ORAL | Status: DC | PRN
  Filled 2021-07-21: qty 15

## 2021-07-21 MED ORDER — METOCLOPRAMIDE HCL 5 MG PO TABS: 5.0000 mg | ORAL_TABLET | Freq: Three times a day (TID) | ORAL | Status: DC | PRN

## 2021-07-21 MED ORDER — IBUPROFEN 400 MG PO TABS: 800.0000 mg | ORAL_TABLET | Freq: Four times a day (QID) | ORAL | Status: DC | PRN

## 2021-07-21 MED ORDER — METHOCARBAMOL 1000 MG/10ML IJ SOLN
500.0000 mg | Freq: Four times a day (QID) | INTRAVENOUS | Status: DC | PRN
  Filled 2021-07-21: qty 5

## 2021-07-21 MED ORDER — LIP MEDEX EX OINT
1.0000 "application " | TOPICAL_OINTMENT | Freq: Two times a day (BID) | CUTANEOUS | Status: DC
  Administered 2021-07-21: 1 via TOPICAL

## 2021-07-21 MED ORDER — TRAMADOL HCL 50 MG PO TABS
50.0000 mg | ORAL_TABLET | Freq: Four times a day (QID) | ORAL | Status: DC | PRN
  Administered 2021-07-21 (×2): 50 mg via ORAL
  Filled 2021-07-21 (×3): qty 1

## 2021-07-21 MED ORDER — GABAPENTIN 300 MG PO CAPS
300.0000 mg | ORAL_CAPSULE | Freq: Every day | ORAL | Status: DC
  Administered 2021-07-21: 300 mg via ORAL
  Filled 2021-07-21: qty 1

## 2021-07-21 MED ORDER — ENALAPRILAT 1.25 MG/ML IV SOLN: 0.6250 mg | Freq: Four times a day (QID) | INTRAVENOUS | Status: DC | PRN

## 2021-07-21 MED ORDER — METHOCARBAMOL 1000 MG/10ML IJ SOLN: 1000.0000 mg | Freq: Four times a day (QID) | INTRAVENOUS | Status: DC | PRN

## 2021-07-21 MED ORDER — CALCIUM POLYCARBOPHIL 625 MG PO TABS
625.0000 mg | ORAL_TABLET | Freq: Two times a day (BID) | ORAL | Status: DC
Start: 2021-07-21 — End: 2021-07-22
  Administered 2021-07-21 – 2021-07-22 (×3): 625 mg via ORAL
  Filled 2021-07-21 (×3): qty 1

## 2021-07-21 NOTE — Progress Notes (Signed)
Physical Therapy Treatment/Discharge from PT  Patient Details Name: Kelsey Bridges MRN: [id number] DOB: [birth year] Today's Date: 07/21/2021   History of Present Illness Patient is a 70 year old female with colon cancer s/p L hemicolectomy on 07/17/21. PMH includes  HTN, GERD, afib    PT Comments    Progressing well with mobility. Pt reported she performed ADLs without assistance on today. She also reports she has been able to mobilize in the room without assistance (with RW) as well. She is agreeable to using a RW for ambulation safety. Will d/c from PT caseload. Could benefit from mobility team continuing to work with her.     Recommendations for follow up therapy are one component of a multi-disciplinary discharge planning process, led by the attending physician.  Recommendations may be updated based on patient status, additional functional criteria and insurance authorization.  Follow Up Recommendations  No PT follow up;Supervision - Intermittent     Equipment Recommendations  Rolling walker with 5" wheels    Recommendations for Other Services       Precautions / Restrictions Precautions Precaution Comments: abd sg Restrictions Weight Bearing Restrictions: No     Mobility  Bed Mobility               General bed mobility comments: oob in recliner    Transfers Overall transfer level: Modified independent   Transfers: Sit to/from Stand Sit to Stand: Modified independent (Device/Increase time)            Ambulation/Gait Ambulation/Gait assistance: Supervision Gait Distance (Feet): 75 Feet Assistive device: Rolling walker (2 wheeled) Gait Pattern/deviations: Step-through pattern;Decreased stride length     General Gait Details: Supv only. No dizziness. No LOB with RW use   Stairs             Wheelchair Mobility    Modified Rankin (Stroke Patients Only)       Balance Overall balance assessment: Mild deficits observed, not formally  tested           Standing balance-Leahy Scale: Fair Standing balance comment: RW to ambulate but can static stand no AD                            Cognition Arousal/Alertness: Awake/alert Behavior During Therapy: WFL for tasks assessed/performed Overall Cognitive Status: Within Functional Limits for tasks assessed                                        Exercises      General Comments        Pertinent Vitals/Pain Pain Assessment: Faces Pain Location: abdomen Pain Descriptors / Indicators: Sore Pain Intervention(s): Monitored during session    Home Living                      Prior Function            PT Goals (current goals can now be found in the care plan section) Acute Rehab PT Goals Patient Stated Goal: go home if I can by myself; can stay with DTR if needed Progress towards PT goals: Goals met/education completed, patient discharged from PT    Frequency    Min 3X/week      PT Plan Current plan remains appropriate    Co-evaluation  AM-PAC PT "6 Clicks" Mobility   Outcome Measure  Help needed turning from your back to your side while in a flat bed without using bedrails?: None Help needed moving from lying on your back to sitting on the side of a flat bed without using bedrails?: A Little Help needed moving to and from a bed to a chair (including a wheelchair)?: None Help needed standing up from a chair using your arms (e.g., wheelchair or bedside chair)?: None Help needed to walk in hospital room?: A Little Help needed climbing 3-5 steps with a railing? : A Little 6 Click Score: 21    End of Session   Activity Tolerance: Patient tolerated treatment well Patient left: in chair;with call bell/phone within reach   PT Visit Diagnosis: Other abnormalities of gait and mobility (R26.89)     Time: 5176-1607 PT Time Calculation (min) (ACUTE ONLY): 13 min  Charges:  $Gait Training: 8-22  mins                         Doreatha Massed, PT Acute Rehabilitation  Office: [phone] Pager: [phone]

## 2021-07-21 NOTE — Care Management Important Message (Signed)
Important Message  Patient Details IM Letter given to the Patient. Name: Kelsey Bridges MRN: [id number] Date of Birth: [birth year]   Medicare Important Message Given:  Yes     Kerin Salen 07/21/2021, 3:29 PM

## 2021-07-21 NOTE — Progress Notes (Signed)
Kelsey Bridges [id number] Mar 23, 1951  CARE TEAM:  PCP: Delford Field, FNP  Outpatient Care Team: Patient Care Team: Delford Field, FNP as PCP - General (Family Medicine) Lorretta Harp, MD as PCP - Cardiology (Cardiology)  Inpatient Treatment Team: Treatment Team: Attending Provider: Ileana Roup, MD; Mobility Specialist: Viviann Spare; Pharmacist: Randa Spike, [facility]; Charge Nurse: Orlena Sheldon, RN; Physical Therapist: Alphonzo Severance, PT; Consulting Physician: Michael Boston, MD; Utilization Review: Rolland Porter, RN; Social Worker: Trish Mage, [place]   Problem List:   Principal Problem:   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 Active Problems:   Essential hypertension   Paroxysmal atrial fibrillation ([place])   Hyperlipidemia   S/P left hemicolectomy   Malnutrition of moderate degree   4 Days Post-Op  07/17/2021  PREOP DIAGNOSIS: COLON CANCER   POSTOP DIAGNOSIS: Descending colon cancer   PROCEDURE:  Robotic assisted left hemicolectomy with colorectal anastomosis Left ureterolysis Takedown of splenic flexure  Lysis of adhesions x 90 minutes Partial omentectomy Repair of small bowel serosa x1, repair of transverse colon serosa x1 Intraoperative assessment of perfusion Flexible sigmoidoscopy Bilateral transversus abdominus plane blocks   SURGEON: Sharon Mt. Dema Severin, MD   ASSISTANT: Michael Boston, MD    Assessment  Slowly improving  [facility] Stay = 4 days)  Plan:  -Chronic atrial fibrillation rate controlled. -Back on full anticoagulation with Eliquis.  Check hemoglobin -Solid diet -Bowel regimen -Improved pain control.  Add gabapentin at night. -VTE prophylaxis- SCDs, etc -mobilize as tolerated to help recovery  Disposition:  Disposition:  The patient is from: Home  Anticipate discharge to:  Home  Anticipated Date of Discharge is:  October 11,2022    Barriers to discharge:   Pending Clinical improvement (more likely than not)  Patient currently is NOT MEDICALLY STABLE for discharge from the hospital from a surgery standpoint.      25 minutes spent in review, evaluation, examination, counseling, and coordination of care.   I have reviewed this patient's available data, including medical history, events of note, physical examination and test results as part of my evaluation.  A significant portion of that time was spent in counseling.  Care during the described time interval was provided by me.  07/21/2021    Subjective: (Chief complaint)  Sore.  Felt some lightheadedness and dizziness but better now.  No nausea or vomiting.  Objective:  Vital signs:  Vitals:   07/20/21 1012 07/20/21 1331 07/20/21 2049 07/21/21 0521  BP: (!) 167/98 110/71 134/90 (!) 156/93  Pulse: (!) 106 83 99 81  Resp:  19 18 18   Temp:  98.7 F (37.1 C) 98.8 F (37.1 C) 98.8 F (37.1 C)  TempSrc:  Oral Oral Oral  SpO2:  100% 100% 97%  Weight:    57.6 kg  Height:        Last BM Date: 07/20/21  Intake/Output   Yesterday:  10/09 0701 - 10/10 0700 In: 960 [P.O.:960] Out: 0  This shift:  No intake/output data recorded.  Bowel function:  Flatus: YES  BM:  YES  Drain: (No drain)   Physical Exam:  General: Pt awake/alert in no acute distress Eyes: PERRL, normal EOM.  Sclera clear.  No icterus Neuro: CN II-XII intact w/o focal sensory/motor deficits. Lymph: No head/neck/groin lymphadenopathy Psych:  No delerium/psychosis/paranoia.  Oriented x 4 HENT: Normocephalic, Mucus membranes moist.  No thrush Neck: Supple, No tracheal deviation.  No obvious thyromegaly Chest: No pain to chest  wall compression.  Good respiratory excursion.  No audible wheezing CV:  Pulses intact.  Regular rhythm.  No major extremity edema MS: Normal AROM mjr joints.  No obvious deformity  Abdomen: Soft.  Nondistended.  Mildly tender at incisions only.  No evidence of peritonitis.  No  incarcerated hernias.  Ext:   No deformity.  No mjr edema.  No cyanosis Skin: No petechiae / purpurea.  No major sores.  Warm and dry    Results:   Cultures: Recent Results (from the past 720 hour(s))  SARS Coronavirus 2 (TAT 6-24 hrs)     Status: None   Collection Time: 07/15/21 12:00 AM  Result Value Ref Range Status   SARS Coronavirus 2 RESULT: NEGATIVE  Final    Comment: RESULT: NEGATIVESARS-CoV-2 INTERPRETATION:A NEGATIVE  test result means that SARS-CoV-2 RNA was not present in the specimen above the limit of detection of this test. This does not preclude a possible SARS-CoV-2 infection and should not be used as the  sole basis for patient management decisions. Negative results must be combined with clinical observations, patient history, and epidemiological information. Optimum specimen types and timing for peak viral levels during infections caused by SARS-CoV-2  have not been determined. Collection of multiple specimens or types of specimens may be necessary to detect virus. Improper specimen collection and handling, sequence variability under primers/probes, or organism present below the limit of detection may  lead to false negative results. Positive and negative predictive values of testing are highly dependent on prevalence. False negative test results are more likely when prevalence of disease is high.The expected result is NEGATIVE.Fact S heet for  Healthcare Providers: LocalChronicle.no Sheet for Patients: SalonLookup.es Reference Range - Negative     Labs: Results for orders placed or performed during the hospital encounter of 07/17/21 (from the past 48 hour(s))  Glucose, capillary     Status: Abnormal   Collection Time: 07/19/21 12:41 PM  Result Value Ref Range   Glucose-Capillary 149 (H) 70 - 99 mg/dL    Comment: Glucose reference range applies only to samples taken after fasting for at least 8 hours.   Glucose, capillary     Status: Abnormal   Collection Time: 07/19/21  5:33 PM  Result Value Ref Range   Glucose-Capillary 140 (H) 70 - 99 mg/dL    Comment: Glucose reference range applies only to samples taken after fasting for at least 8 hours.  Glucose, capillary     Status: Abnormal   Collection Time: 07/19/21  8:50 PM  Result Value Ref Range   Glucose-Capillary 135 (H) 70 - 99 mg/dL    Comment: Glucose reference range applies only to samples taken after fasting for at least 8 hours.  Basic metabolic panel     Status: Abnormal   Collection Time: 07/20/21  4:33 AM  Result Value Ref Range   Sodium 136 135 - 145 mmol/L   Potassium 4.0 3.5 - 5.1 mmol/L   Chloride 103 98 - 111 mmol/L   CO2 23 22 - 32 mmol/L   Glucose, Bld 166 (H) 70 - 99 mg/dL    Comment: Glucose reference range applies only to samples taken after fasting for at least 8 hours.   BUN 18 8 - 23 mg/dL   Creatinine, Ser 1.08 (H) 0.44 - 1.00 mg/dL   Calcium 9.0 8.9 - 10.3 mg/dL   GFR, Estimated 55 (L) >60 mL/min    Comment: (NOTE) Calculated using the CKD-EPI Creatinine Equation (2021)    Anion gap 10  5 - 15    Comment: Performed at [facility], [place] [address], [place], [place] [address]  CBC     Status: Abnormal   Collection Time: 07/20/21  4:33 AM  Result Value Ref Range   WBC 9.0 4.0 - 10.5 K/uL   RBC 3.79 (L) 3.87 - 5.11 MIL/uL   Hemoglobin 9.6 (L) 12.0 - 15.0 g/dL   HCT 30.6 (L) 36.0 - 46.0 %   MCV 80.7 80.0 - 100.0 fL   MCH 25.3 (L) 26.0 - 34.0 pg   MCHC 31.4 30.0 - 36.0 g/dL   RDW 20.9 (H) 11.5 - 15.5 %   Platelets 322 150 - 400 K/uL   nRBC 0.0 0.0 - 0.2 %    Comment: Performed at [facility], [place] [address] [place], [place] [address]  Glucose, capillary     Status: Abnormal   Collection Time: 07/20/21  7:59 AM  Result Value Ref Range   Glucose-Capillary 121 (H) 70 - 99 mg/dL    Comment: Glucose reference range applies only to samples taken after fasting for at  least 8 hours.  Troponin I (High Sensitivity)     Status: None   Collection Time: 07/20/21  9:57 AM  Result Value Ref Range   Troponin I (High Sensitivity) 4 <18 ng/L    Comment: (NOTE) Elevated high sensitivity troponin I (hsTnI) values and significant  changes across serial measurements may suggest ACS but many other  chronic and acute conditions are known to elevate hsTnI results.  Refer to the "Links" section for chest pain algorithms and additional  guidance. Performed at [facility], [place] [address] [place], [place] [address]   Troponin I (High Sensitivity)     Status: None   Collection Time: 07/20/21 11:06 AM  Result Value Ref Range   Troponin I (High Sensitivity) 5 <18 ng/L    Comment: (NOTE) Elevated high sensitivity troponin I (hsTnI) values and significant  changes across serial measurements may suggest ACS but many other  chronic and acute conditions are known to elevate hsTnI results.  Refer to the "Links" section for chest pain algorithms and additional  guidance. Performed at [facility], [place] [address] [place],  [address]   Glucose, capillary     Status: Abnormal   Collection Time: 07/20/21 11:10 AM  Result Value Ref Range   Glucose-Capillary 134 (H) 70 - 99 mg/dL    Comment: Glucose reference range applies only to samples taken after fasting for at least 8 hours.  Glucose, capillary     Status: Abnormal   Collection Time: 07/20/21  4:18 PM  Result Value Ref Range   Glucose-Capillary 100 (H) 70 - 99 mg/dL    Comment: Glucose reference range applies only to samples taken after fasting for at least 8 hours.  Glucose, capillary     Status: Abnormal   Collection Time: 07/20/21  9:10 PM  Result Value Ref Range   Glucose-Capillary 123 (H) 70 - 99 mg/dL    Comment: Glucose reference range applies only to samples taken after fasting for at least 8 hours.  Basic metabolic panel     Status: Abnormal   Collection Time:  07/21/21  4:33 AM  Result Value Ref Range   Sodium 139 135 - 145 mmol/L   Potassium 4.1 3.5 - 5.1 mmol/L   Chloride 107 98 - 111 mmol/L   CO2 27 22 - 32 mmol/L   Glucose, Bld 125 (H) 70 -  99 mg/dL    Comment: Glucose reference range applies only to samples taken after fasting for at least 8 hours.   BUN 13 8 - 23 mg/dL   Creatinine, Ser 0.98 0.44 - 1.00 mg/dL   Calcium 8.7 (L) 8.9 - 10.3 mg/dL   GFR, Estimated >60 >60 mL/min    Comment: (NOTE) Calculated using the CKD-EPI Creatinine Equation (2021)    Anion gap 5 5 - 15    Comment: Performed at [facility], [place] [address], [place], [place] [address]  CBC     Status: Abnormal   Collection Time: 07/21/21  4:33 AM  Result Value Ref Range   WBC 6.7 4.0 - 10.5 K/uL   RBC 3.38 (L) 3.87 - 5.11 MIL/uL   Hemoglobin 8.5 (L) 12.0 - 15.0 g/dL   HCT 27.1 (L) 36.0 - 46.0 %   MCV 80.2 80.0 - 100.0 fL   MCH 25.1 (L) 26.0 - 34.0 pg   MCHC 31.4 30.0 - 36.0 g/dL   RDW 20.6 (H) 11.5 - 15.5 %   Platelets 298 150 - 400 K/uL   nRBC 0.0 0.0 - 0.2 %    Comment: Performed at [facility], [place] [address] [place], [place] [address]  Magnesium     Status: None   Collection Time: 07/21/21  4:33 AM  Result Value Ref Range   Magnesium 1.9 1.7 - 2.4 mg/dL    Comment: Performed at [facility], [place] [address], [place], [place] [address]  Glucose, capillary     Status: Abnormal   Collection Time: 07/21/21  7:52 AM  Result Value Ref Range   Glucose-Capillary 114 (H) 70 - 99 mg/dL    Comment: Glucose reference range applies only to samples taken after fasting for at least 8 hours.    Imaging / Studies: DG CHEST PORT 1 VIEW  Result Date: 07/20/2021 CLINICAL DATA:  70 year old female with history of recent abdominal surgery. New onset of palpitations today. EXAM: PORTABLE CHEST 1 VIEW COMPARISON:  Chest x-ray 10/13/2019. FINDINGS: Large volume of pneumoperitoneum but the third both of the  hemidiaphragms. Lung volumes are normal. No consolidative airspace disease. No pleural effusions. No pneumothorax. No definite suspicious appearing pulmonary nodules or masses are noted. No evidence of pulmonary edema. Heart size is mildly enlarged. The patient is rotated to the right on today's exam, resulting in distortion of the mediastinal contours and reduced diagnostic sensitivity and specificity for mediastinal pathology. Atherosclerotic calcifications in the thoracic aorta. IMPRESSION: 1. Large volume of pneumoperitoneum, presumably secondary to recent abdominal surgery. 2. No radiographic evidence of acute cardiopulmonary disease. 3. Cardiomegaly. 4. Aortic atherosclerosis. Electronically Signed   By: Vinnie Langton M.D.   On: 07/20/2021 12:15    Medications / Allergies: per chart  Antibiotics: Anti-infectives (From admission, onward)    Start     Dose/Rate Route Frequency Ordered Stop   07/17/21 1400  neomycin (MYCIFRADIN) tablet 1,000 mg  Status:  Discontinued       See Hyperspace for full Linked Orders Report.   1,000 mg Oral 3 times per day 07/17/21 0926 07/17/21 0934   07/17/21 1400  metroNIDAZOLE (FLAGYL) tablet 1,000 mg  Status:  Discontinued       See Hyperspace for full Linked Orders Report.   1,000 mg Oral 3 times per day 07/17/21 0926 07/17/21 0934   07/17/21 0600  clindamycin (CLEOCIN) IVPB 900 mg       See Hyperspace for full Linked Orders Report.  900 mg 100 mL/hr over 30 Minutes Intravenous 60 min pre-op 07/16/21 0756 07/17/21 1129   07/17/21 0600  gentamicin (GARAMYCIN) 280 mg in dextrose 5 % 100 mL IVPB       See Hyperspace for full Linked Orders Report.   5 mg/kg  56.2 kg 214 mL/hr over 30 Minutes Intravenous 60 min pre-op 07/16/21 0756 07/17/21 1216         Note: Portions of this report may have been transcribed using voice recognition software. Every effort was made to ensure accuracy; however, inadvertent computerized transcription errors may be present.    Any transcriptional errors that result from this process are unintentional.    Adin Hector, MD, FACS, MASCRS Esophageal, Gastrointestinal & Colorectal Surgery Robotic and Minimally Invasive Surgery  [facility], [place]. [address], [place], [place] [address] [phone] Fax [phone] Main  CONTACT INFORMATION:  Weekday (9AM-5PM): Call [facility] main office at [phone]  Weeknight (5PM-9AM) or Weekend/Holiday: Check [email or web address] (password " [email or web address]") for General Surgery [facility] coverage  (Please, do not use SecureChat as it is not reliable communication to operating surgeons for immediate patient care)      07/21/2021  8:50 AM

## 2021-07-22 LAB — CBC
HCT: 28.3 % — ABNORMAL LOW (ref 36.0–46.0)
Hemoglobin: 8.9 g/dL — ABNORMAL LOW (ref 12.0–15.0)
MCH: 25.2 pg — ABNORMAL LOW (ref 26.0–34.0)
MCHC: 31.4 g/dL (ref 30.0–36.0)
MCV: 80.2 fL (ref 80.0–100.0)
Platelets: 347 10*3/uL (ref 150–400)
RBC: 3.53 MIL/uL — ABNORMAL LOW (ref 3.87–5.11)
RDW: 20.2 % — ABNORMAL HIGH (ref 11.5–15.5)
WBC: 7.9 10*3/uL (ref 4.0–10.5)
nRBC: 0 % (ref 0.0–0.2)

## 2021-07-22 LAB — POTASSIUM: Potassium: 4.5 mmol/L (ref 3.5–5.1)

## 2021-07-22 LAB — GLUCOSE, CAPILLARY: Glucose-Capillary: 114 mg/dL — ABNORMAL HIGH (ref 70–99)

## 2021-07-22 LAB — MAGNESIUM: Magnesium: 2 mg/dL (ref 1.7–2.4)

## 2021-07-22 LAB — CREATININE, SERUM
Creatinine, Ser: 0.88 mg/dL (ref 0.44–1.00)
GFR, Estimated: >60 mL/min (ref >60)

## 2021-07-22 NOTE — TOC Transition Note (Signed)
Transition of Care [facility]) - CM/SW Discharge Note   Patient Details  Name: Kelsey Bridges MRN: [id number] Date of Birth: Dec 19, 1950  [facility] CM/SW Contact:  Leeroy Cha, RN Phone Number: 07/22/2021, 9:21 AM   Clinical Narrative:    Rolling walk er ordered through [employer] dme   Final next level of care: Home/Self Care Barriers to Discharge: No Barriers Identified   Patient Goals and CMS Choice Patient states their goals for this hospitalization and ongoing recovery are:: to go home      Discharge Placement                       Discharge Plan and Services   Discharge Planning Services: CM Consult            DME Arranged: Gilford Rile rolling DME Agency: [employer] Date DME Agency Contacted: 07/22/21 Time DME Agency Contacted: [phone] Representative spoke with at DME Agency: [email or web address]            Social Determinants of Health ([place]) Interventions     Readmission Risk Interventions No flowsheet data found.

## 2021-07-22 NOTE — Progress Notes (Signed)
Mobility Specialist - Progress Note    07/22/21 1200  Mobility  Activity Ambulated in hall  Level of Assistance Modified independent, requires aide device or extra time  Assistive Device Front wheel walker  Distance Ambulated (ft) 475 ft  Mobility Ambulated independently in hallway  Mobility Response Tolerated well  Mobility performed by Mobility specialist  $Mobility charge 1 Mobility    Upon entry pt was agreeable to ambulate and used RW to walk 475 ft in hallway. Pt did not experience SOB, dizziness, or pain during session. Pt required 1 standing rest break before returning to room. Left in recliner with call bell at side and awaiting d/c.   [facility] Phone: [phone] 07/22/21, 12:01 PM

## 2021-07-22 NOTE — Discharge Summary (Addendum)
Physician Discharge Summary    Patient ID: Kelsey Bridges MRN: [id number] DOB/AGE: 71/10/1950  70 y.o.  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 Active Problems:   Essential hypertension   Chronic atrial fibrillation ([place])   Hyperlipidemia   S/P left hemicolectomy   Malnutrition of moderate degree   Chronic anticoagulation   5 Days Post-Op  07/17/2021  07/17/2021   PREOP DIAGNOSIS: COLON CANCER   POSTOP DIAGNOSIS: Descending colon cancer   PROCEDURE:  Robotic assisted left hemicolectomy with colorectal anastomosis Left ureterolysis Takedown of splenic flexure  Lysis of adhesions x 90 minutes Partial omentectomy Repair of small bowel serosa x1, repair of transverse colon serosa x1 Intraoperative assessment of perfusion Flexible sigmoidoscopy Bilateral transversus abdominus plane blocks   SURGEON: Sharon Mt. Dema Severin, MD   ASSISTANT: Michael Boston, MD  Consults: None  Hospital Course:   The patient underwent the surgery above.  Postoperatively, the patient gradually mobilized and advanced to a solid diet.  Pain and other symptoms were treated aggressively.    By the time of discharge, the patient was walking well the hallways, eating food, having flatus.  Pain was well-controlled on an oral medications.  Based on meeting discharge criteria and continuing to recover, I felt it was safe for the patient to be discharged from the hospital to further recover with close followup. Postoperative recommendations were discussed in detail.  They are written as well.  While  the patient lives by herself, she has a Industrial/product designer and friends that are planning to help take care of her and recover these first few weeks.  Plan to have her follow-up in the office next Monday for staple removal from her dominant incision and then follow-up with Dr. Dema Severin later in the month.  Discharged Condition: good  Discharge Exam: Blood pressure (!) 156/101, pulse 82, temperature 98.4 F (36.9 C), temperature source Oral, resp. rate 16, height 5\' 1"  (1.549 m), weight 58.2 kg, SpO2 100 %.  General: Pt awake/alert/oriented x4 in No acute distress Eyes: PERRL, normal EOM.  Sclera clear.  No icterus Neuro: CN II-XII intact w/o focal sensory/motor deficits. Lymph: No head/neck/groin lymphadenopathy Psych:  No delerium/psychosis/paranoia HENT: Normocephalic, Mucus membranes moist.  No thrush Neck: Supple, No tracheal deviation Chest:  No chest wall pain w good excursion CV:  Pulses intact.  Regular rhythm MS: Normal AROM mjr joints.  No obvious deformity Abdomen: Soft.  Nondistended.  Mildly tender at incisions only.  No evidence of peritonitis.  No incarcerated hernias. Ext:  SCDs BLE.  No mjr edema.  No cyanosis Skin: No petechiae / purpura   Disposition:    Follow-up Information     [facility] Follow up in 1 week(s).   Specialty: General Surgery Why: To have sutures/staples removed from incision(s) Contact information: [address] [place] [phone]        Ileana Roup, MD Follow up in 2 week(s).   Specialties: General Surgery, Colon and Rectal Surgery Why: To follow up after your operation Contact information: [place] [id number] [phone]  Discharge disposition: 01-Home or Self Care       Discharge Instructions     Call MD for:   Complete by: As directed    FEVER > 101.5 F  (temperatures < 101.5 F are not  significant)   Call MD for:  extreme fatigue   Complete by: As directed    Call MD for:  persistant dizziness or light-headedness   Complete by: As directed    Call MD for:  persistant nausea and vomiting   Complete by: As directed    Call MD for:  redness, tenderness, or signs of infection (pain, swelling, redness, odor or green/yellow discharge around incision site)   Complete by: As directed    Call MD for:  severe uncontrolled pain   Complete by: As directed    Diet - low sodium heart healthy   Complete by: As directed    Start with a bland diet such as soups, liquids, starchy foods, low fat foods, etc. the first few days at home. Gradually advance to a solid, low-fat, high fiber diet by the end of the first week at home.   Add a fiber supplement to your diet (Metamucil, etc) If you feel full, bloated, or constipated, stay on a full liquid or pureed/blenderized diet for a few days until you feel better and are no longer constipated.   Discharge instructions   Complete by: As directed    See Discharge Instructions If you are not getting better after two weeks or are noticing you are getting worse, contact our office [phone] for further advice.  We may need to adjust your medications, re-evaluate you in the office, send you to the emergency room, or see what other things we can do to help. The clinic staff is available to answer your questions during regular business hours (8:30am-5pm).  Please don't hesitate to call and ask to speak to one of our nurses for clinical concerns.    A surgeon from [facility] is always on call at the hospitals 24 hours/day If you have a medical emergency, go to the nearest emergency room or call 911.   Discharge wound care:   Complete by: As directed    It is good for closed incisions and even open wounds to be washed every day.  Shower every day.  Short baths are fine.  Wash the incisions and wounds clean with soap & water.    You may  leave closed incisions open to air if it is dry.   You may cover the incision with clean gauze & replace it after your daily shower for comfort.  STAPLES: You have skin staples.  Leave them in place & set up an appointment for them to be removed by a surgery office nurse ~10 days after surgery = Monday 10/17   Driving Restrictions   Complete by: As directed    You may drive when: - you are no longer taking narcotic prescription pain medication - you can comfortably wear a seatbelt - you can safely make sudden turns/stops without pain.   Increase activity slowly   Complete by: As directed    Start light daily activities --- self-care, walking, climbing stairs- beginning the day after surgery.  Gradually increase activities as tolerated.  Control your pain to be active.  Stop when you are tired.  Ideally, walk several times a day, eventually an hour a day.   Most people are back to most day-to-day activities in a few weeks.  It takes 4-6 weeks to get back to unrestricted, intense activity. If you can walk 30 minutes without difficulty, it is safe to try more intense activity such as jogging, treadmill, bicycling, low-impact aerobics, swimming, etc. Save the most intensive and strenuous activity for last (Usually 4-8 weeks after surgery) such as sit-ups, heavy lifting, contact sports, etc.  Refrain from any intense heavy lifting or straining until you are off narcotics for pain control.  You will have off days, but things should improve week-by-week. DO NOT PUSH THROUGH PAIN.  Let pain be your guide: If it hurts to do something, don't do it.   Lifting restrictions   Complete by: As directed    If you can walk 30 minutes without difficulty, it is safe to try more intense activity such as jogging, treadmill, bicycling, low-impact aerobics, swimming, etc. Save the most intensive and strenuous activity for last (Usually 4-8 weeks after surgery) such as sit-ups, heavy lifting, contact sports, etc.    Refrain from any intense heavy lifting or straining until you are off narcotics for pain control.  You will have off days, but things should improve week-by-week. DO NOT PUSH THROUGH PAIN.  Let pain be your guide: If it hurts to do something, don't do it.  Pain is your body warning you to avoid that activity for another week until the pain goes down.   May shower / Bathe   Complete by: As directed    May walk up steps   Complete by: As directed    Sexual Activity Restrictions   Complete by: As directed    You may have sexual intercourse when it is comfortable. If it hurts to do something, stop.       Allergies as of 07/22/2021       Reactions   Penicillins Rash   Reaction to injection / not tablets        Medication List     STOP taking these medications    metroNIDAZOLE 250 MG tablet Commonly known as: FLAGYL   metroNIDAZOLE 500 MG tablet Commonly known as: FLAGYL   neomycin 500 MG tablet Commonly known as: MYCIFRADIN   polyethylene glycol powder 17 GM/SCOOP powder Commonly known as: GLYCOLAX/MIRALAX       TAKE these medications    bisacodyl 5 MG EC tablet Generic drug: bisacodyl Take 20 mg by mouth See admin instructions. On day before procedure take 4 tablets (20 mg) by mouth at 7am   diltiazem 240 MG 24 hr capsule Commonly known as: CARDIZEM CD Take 1 capsule (240 mg total) by mouth daily.   Eliquis 5 MG Tabs tablet Generic drug: apixaban Take 1 tablet (5 mg total) by mouth 2 (two) times daily. What changed: when to take this   ferrous sulfate 325 (65 FE) MG tablet Take 325 mg by mouth every morning.   ibuprofen 800 MG tablet Commonly known as: ADVIL Take 800 mg by mouth daily as needed (pain).   losartan 50 MG tablet Commonly known as: COZAAR Take 50 mg by mouth every morning.   meloxicam 15 MG tablet Commonly known as: MOBIC Take 15 mg by mouth daily as needed (knee pain).   omeprazole 20 MG capsule Commonly known as: PRILOSEC Take 1  capsule (20 mg total) by mouth 2 (two) times daily. What changed: when to take this   sertraline 100 MG tablet Commonly known as: ZOLOFT Take 100 mg by mouth daily as needed (anxiety).   traMADol 50 MG tablet Commonly known as:  Ultram Take 1 tablet (50 mg total) by mouth every 6 (six) hours as needed for up to 5 days (postop pain not controlled with tylenol first).               Durable Medical Equipment  (From admission, onward)           Start     Ordered   07/21/21 0859  For home use only DME Walker rolling  Once       Comments: To help patient transfer and ambulate.  Physical / Occupational Therapy may change type of walker PRN.  Question Answer Comment  Walker: With [place]   Patient needs a walker to treat with the following condition Balance problem      07/21/21 0858   07/21/21 0804  For home use only DME Walker rolling  Once       Question Answer Comment  Walker: With [place]   Patient needs a walker to treat with the following condition S/P left hemicolectomy      07/21/21 0804              Discharge Care Instructions  (From admission, onward)           Start     Ordered   07/22/21 0000  Discharge wound care:       Comments: It is good for closed incisions and even open wounds to be washed every day.  Shower every day.  Short baths are fine.  Wash the incisions and wounds clean with soap & water.    You may leave closed incisions open to air if it is dry.   You may cover the incision with clean gauze & replace it after your daily shower for comfort.  STAPLES: You have skin staples.  Leave them in place & set up an appointment for them to be removed by a surgery office nurse ~10 days after surgery = Monday 10/17   07/22/21 8850            Significant Diagnostic Studies:  Results for orders placed or performed during the hospital encounter of 07/17/21 (from the past 72 hour(s))  Glucose, capillary     Status: Abnormal    Collection Time: 07/19/21 12:41 PM  Result Value Ref Range   Glucose-Capillary 149 (H) 70 - 99 mg/dL    Comment: Glucose reference range applies only to samples taken after fasting for at least 8 hours.  Glucose, capillary     Status: Abnormal   Collection Time: 07/19/21  5:33 PM  Result Value Ref Range   Glucose-Capillary 140 (H) 70 - 99 mg/dL    Comment: Glucose reference range applies only to samples taken after fasting for at least 8 hours.  Glucose, capillary     Status: Abnormal   Collection Time: 07/19/21  8:50 PM  Result Value Ref Range   Glucose-Capillary 135 (H) 70 - 99 mg/dL    Comment: Glucose reference range applies only to samples taken after fasting for at least 8 hours.  Basic metabolic panel     Status: Abnormal   Collection Time: 07/20/21  4:33 AM  Result Value Ref Range   Sodium 136 135 - 145 mmol/L   Potassium 4.0 3.5 - 5.1 mmol/L   Chloride 103 98 - 111 mmol/L   CO2 23 22 - 32 mmol/L   Glucose, Bld 166 (H) 70 - 99 mg/dL    Comment: Glucose reference range applies only to samples taken  after fasting for at least 8 hours.   BUN 18 8 - 23 mg/dL   Creatinine, Ser 1.08 (H) 0.44 - 1.00 mg/dL   Calcium 9.0 8.9 - 10.3 mg/dL   GFR, Estimated 55 (L) >60 mL/min    Comment: (NOTE) Calculated using the CKD-EPI Creatinine Equation (2021)    Anion gap 10 5 - 15    Comment: Performed at [facility], [place] [address] [place], [place] [address]  CBC     Status: Abnormal   Collection Time: 07/20/21  4:33 AM  Result Value Ref Range   WBC 9.0 4.0 - 10.5 K/uL   RBC 3.79 (L) 3.87 - 5.11 MIL/uL   Hemoglobin 9.6 (L) 12.0 - 15.0 g/dL   HCT 30.6 (L) 36.0 - 46.0 %   MCV 80.7 80.0 - 100.0 fL   MCH 25.3 (L) 26.0 - 34.0 pg   MCHC 31.4 30.0 - 36.0 g/dL   RDW 20.9 (H) 11.5 - 15.5 %   Platelets 322 150 - 400 K/uL   nRBC 0.0 0.0 - 0.2 %    Comment: Performed at [facility], [place] [address] [place], [place] [address]  Glucose, capillary      Status: Abnormal   Collection Time: 07/20/21  7:59 AM  Result Value Ref Range   Glucose-Capillary 121 (H) 70 - 99 mg/dL    Comment: Glucose reference range applies only to samples taken after fasting for at least 8 hours.  Troponin I (High Sensitivity)     Status: None   Collection Time: 07/20/21  9:57 AM  Result Value Ref Range   Troponin I (High Sensitivity) 4 <18 ng/L    Comment: (NOTE) Elevated high sensitivity troponin I (hsTnI) values and significant  changes across serial measurements may suggest ACS but many other  chronic and acute conditions are known to elevate hsTnI results.  Refer to the "Links" section for chest pain algorithms and additional  guidance. Performed at [facility], [place] [address], [place], [place] [address]   Troponin I (High Sensitivity)     Status: None   Collection Time: 07/20/21 11:06 AM  Result Value Ref Range   Troponin I (High Sensitivity) 5 <18 ng/L    Comment: (NOTE) Elevated high sensitivity troponin I (hsTnI) values and significant  changes across serial measurements may suggest ACS but many other  chronic and acute conditions are known to elevate hsTnI results.  Refer to the "Links" section for chest pain algorithms and additional  guidance. Performed at [facility], [place] [address], [place], [place] [address]   Glucose, capillary     Status: Abnormal   Collection Time: 07/20/21 11:10 AM  Result Value Ref Range   Glucose-Capillary 134 (H) 70 - 99 mg/dL    Comment: Glucose reference range applies only to samples taken after fasting for at least 8 hours.  Glucose, capillary     Status: Abnormal   Collection Time: 07/20/21  4:18 PM  Result Value Ref Range   Glucose-Capillary 100 (H) 70 - 99 mg/dL    Comment: Glucose reference range applies only to samples taken after fasting for at least 8 hours.  Glucose, capillary     Status: Abnormal   Collection Time: 07/20/21  9:10 PM  Result Value Ref Range    Glucose-Capillary 123 (H) 70 - 99 mg/dL    Comment: Glucose reference range applies only to samples taken after fasting for at least 8 hours.  Basic metabolic panel  Status: Abnormal   Collection Time: 07/21/21  4:33 AM  Result Value Ref Range   Sodium 139 135 - 145 mmol/L   Potassium 4.1 3.5 - 5.1 mmol/L   Chloride 107 98 - 111 mmol/L   CO2 27 22 - 32 mmol/L   Glucose, Bld 125 (H) 70 - 99 mg/dL    Comment: Glucose reference range applies only to samples taken after fasting for at least 8 hours.   BUN 13 8 - 23 mg/dL   Creatinine, Ser 0.98 0.44 - 1.00 mg/dL   Calcium 8.7 (L) 8.9 - 10.3 mg/dL   GFR, Estimated >60 >60 mL/min    Comment: (NOTE) Calculated using the CKD-EPI Creatinine Equation (2021)    Anion gap 5 5 - 15    Comment: Performed at [facility], [place] [address], [place], [place] [address]  CBC     Status: Abnormal   Collection Time: 07/21/21  4:33 AM  Result Value Ref Range   WBC 6.7 4.0 - 10.5 K/uL   RBC 3.38 (L) 3.87 - 5.11 MIL/uL   Hemoglobin 8.5 (L) 12.0 - 15.0 g/dL   HCT 27.1 (L) 36.0 - 46.0 %   MCV 80.2 80.0 - 100.0 fL   MCH 25.1 (L) 26.0 - 34.0 pg   MCHC 31.4 30.0 - 36.0 g/dL   RDW 20.6 (H) 11.5 - 15.5 %   Platelets 298 150 - 400 K/uL   nRBC 0.0 0.0 - 0.2 %    Comment: Performed at [facility], [place] [address] [place], [place] [address]  Magnesium     Status: None   Collection Time: 07/21/21  4:33 AM  Result Value Ref Range   Magnesium 1.9 1.7 - 2.4 mg/dL    Comment: Performed at [facility], [place] [address], [place], [place] [address]  Glucose, capillary     Status: Abnormal   Collection Time: 07/21/21  7:52 AM  Result Value Ref Range   Glucose-Capillary 114 (H) 70 - 99 mg/dL    Comment: Glucose reference range applies only to samples taken after fasting for at least 8 hours.  Glucose, capillary     Status: Abnormal   Collection Time: 07/21/21 11:56 AM  Result Value Ref Range    Glucose-Capillary 115 (H) 70 - 99 mg/dL    Comment: Glucose reference range applies only to samples taken after fasting for at least 8 hours.  Glucose, capillary     Status: Abnormal   Collection Time: 07/21/21  4:46 PM  Result Value Ref Range   Glucose-Capillary 165 (H) 70 - 99 mg/dL    Comment: Glucose reference range applies only to samples taken after fasting for at least 8 hours.  Glucose, capillary     Status: None   Collection Time: 07/21/21  9:19 PM  Result Value Ref Range   Glucose-Capillary 87 70 - 99 mg/dL    Comment: Glucose reference range applies only to samples taken after fasting for at least 8 hours.  CBC     Status: Abnormal   Collection Time: 07/22/21  3:55 AM  Result Value Ref Range   WBC 7.9 4.0 - 10.5 K/uL   RBC 3.53 (L) 3.87 - 5.11 MIL/uL   Hemoglobin 8.9 (L) 12.0 - 15.0 g/dL   HCT 28.3 (L) 36.0 - 46.0 %   MCV 80.2 80.0 - 100.0 fL   MCH 25.2 (L) 26.0 - 34.0 pg   MCHC 31.4 30.0 - 36.0 g/dL   RDW 20.2 (  H) 11.5 - 15.5 %   Platelets 347 150 - 400 K/uL   nRBC 0.0 0.0 - 0.2 %    Comment: Performed at [facility], [place] [address], [place], [place] [address]  Potassium     Status: None   Collection Time: 07/22/21  3:55 AM  Result Value Ref Range   Potassium 4.5 3.5 - 5.1 mmol/L    Comment: Performed at [facility], [place] [address], [place], [place] [address]  Magnesium     Status: None   Collection Time: 07/22/21  3:55 AM  Result Value Ref Range   Magnesium 2.0 1.7 - 2.4 mg/dL    Comment: Performed at [facility], [place] [address], [place], [place] [address]  Creatinine, serum     Status: None   Collection Time: 07/22/21  3:55 AM  Result Value Ref Range   Creatinine, Ser 0.88 0.44 - 1.00 mg/dL   GFR, Estimated >60 >60 mL/min    Comment: (NOTE) Calculated using the CKD-EPI Creatinine Equation (2021) Performed at [facility], [place] [address] [place], [place] [address]   Glucose,  capillary     Status: Abnormal   Collection Time: 07/22/21  7:31 AM  Result Value Ref Range   Glucose-Capillary 114 (H) 70 - 99 mg/dL    Comment: Glucose reference range applies only to samples taken after fasting for at least 8 hours.    No results found.  Past Medical History:  Diagnosis Date   Abnormal Pap smear    Allergy    Atrial fibrillation ([place])    Dysrhythmia    Hammer toe    Hypertension     Past Surgical History:  Procedure Laterality Date   ABDOMINAL HYSTERECTOMY     FLEXIBLE SIGMOIDOSCOPY N/A 07/17/2021   Procedure: FLEXIBLE SIGMOIDOSCOPY;  Surgeon: Ileana Roup, MD;  Location: [facility];  Service: General;  Laterality: N/A;   HAMMER TOE SURGERY Right 2019   LAPAROSCOPIC SUPRACERVICAL HYSTERECTOMY     left ovary  10/12/1986   Varicose veins      Social History   Socioeconomic History   Marital status: Widowed    Spouse name: Not on file   Number of children: Not on file   Years of education: Not on file   Highest education level: Not on file  Occupational History   Not on file  Tobacco Use   Smoking status: Every Day    Packs/day: 0.20    Types: Cigarettes   Smokeless tobacco: Current    Types: Snuff  Vaping Use   Vaping Use: Never used  Substance and Sexual Activity   Alcohol use: Yes    Comment: "at the holidays maybe"   Drug use: No   Sexual activity: Not Currently    Birth control/protection: Surgical  Other Topics Concern   Not on file  Social History Narrative   Not on file   Social Determinants of Health   Financial Resource Strain: Not on file  Food Insecurity: Not on file  Transportation Needs: Not on file  Physical Activity: Not on file  Stress: Not on file  Social Connections: Not on file  Intimate Partner Violence: Not on file    Family History  Problem Relation Age of Onset   Cancer Brother        liver   Cancer Sister        breast    Current Facility-Administered Medications  Medication Dose Route  Frequency Provider Last  Rate Last Admin   acetaminophen (TYLENOL) tablet 1,000 mg  1,000 mg Oral Q6H Ileana Roup, MD   1,000 mg at 07/21/21 1932   alum & mag hydroxide-simeth (MAALOX/MYLANTA) 200-200-20 MG/5ML suspension 30 mL  30 mL Oral Q6H PRN Ileana Roup, MD       apixaban Arne Cleveland) tablet 5 mg  5 mg Oral BID Romana Juniper A, MD   5 mg at 07/21/21 2110   diltiazem (CARDIZEM CD) 24 hr capsule 240 mg  240 mg Oral Daily Ileana Roup, MD   240 mg at 07/21/21 6803   diphenhydrAMINE (BENADRYL) 12.5 MG/5ML elixir 12.5 mg  12.5 mg Oral Q6H PRN Ileana Roup, MD       Or   diphenhydrAMINE (BENADRYL) injection 12.5 mg  12.5 mg Intravenous Q6H PRN Ileana Roup, MD       feeding supplement (ENSURE ENLIVE / ENSURE PLUS) liquid 237 mL  237 mL Oral BID BM Ileana Roup, MD   237 mL at 07/21/21 1454   ferrous sulfate tablet 325 mg  325 mg Oral q morning Ileana Roup, MD   325 mg at 07/21/21 0931   gabapentin (NEURONTIN) capsule 300 mg  300 mg Oral Ardeen Fillers, MD   300 mg at 07/21/21 2110   hydrALAZINE (APRESOLINE) injection 10 mg  10 mg Intravenous Q2H PRN Ileana Roup, MD       HYDROmorphone (DILAUDID) injection 0.5 mg  0.5 mg Intravenous Q3H PRN Ileana Roup, MD       ibuprofen (ADVIL) tablet 800 mg  800 mg Oral Q6H PRN Michael Boston, MD       insulin aspart (novoLOG) injection 0-5 Units  0-5 Units Subcutaneous QHS Ileana Roup, MD       insulin aspart (novoLOG) injection 0-9 Units  0-9 Units Subcutaneous TID WC Ileana Roup, MD   2 Units at 07/21/21 1752   lip balm (CARMEX) ointment 1 application  1 application Topical BID Michael Boston, MD   1 application at 21/22/48 2110   losartan (COZAAR) tablet 50 mg  50 mg Oral q morning Ileana Roup, MD   50 mg at 07/21/21 2500   magic mouthwash  15 mL Oral QID PRN Michael Boston, MD       methocarbamol (ROBAXIN) tablet 1,000 mg  1,000 mg Oral Q6H PRN Dimple Nanas, RPH       Or   methocarbamol (ROBAXIN) 500 mg in dextrose 5 % 50 mL IVPB  500 mg Intravenous Q6H PRN Dimple Nanas, RPH       metoCLOPramide (REGLAN) injection 5-10 mg  5-10 mg Intravenous Q8H PRN Dimple Nanas, RPH       Or   metoCLOPramide (REGLAN) tablet 5-10 mg  5-10 mg Oral Q8H PRN Dimple Nanas, RPH       multivitamin with minerals tablet 1 tablet  1 tablet Oral Daily Ileana Roup, MD   1 tablet at 07/21/21 0926   ondansetron (ZOFRAN) tablet 4 mg  4 mg Oral Q6H PRN Ileana Roup, MD       Or   ondansetron [facility]) injection 4 mg  4 mg Intravenous Q6H PRN Ileana Roup, MD   4 mg at 07/20/21 0459   pantoprazole (PROTONIX) EC tablet 40 mg  40 mg Oral Daily Ileana Roup, MD   40 mg at 07/21/21 0926   polycarbophil (FIBERCON) tablet 625 mg  625 mg Oral BID Michael Boston, MD  625 mg at 07/21/21 2109   potassium chloride (KLOR-CON) packet 20 mEq  20 mEq Oral BID Romana Juniper A, MD   20 mEq at 07/21/21 2110   sertraline (ZOLOFT) tablet 100 mg  100 mg Oral Daily PRN Michael Boston, MD       simethicone [facility]) chewable tablet 40 mg  40 mg Oral Q6H PRN Ileana Roup, MD       traMADol Veatrice Bourbon) tablet 50-100 mg  50-100 mg Oral Q6H PRN Michael Boston, MD   50 mg at 07/21/21 1752   traZODone (DESYREL) tablet 50 mg  50 mg Oral QHS PRN Clovis Riley, MD   50 mg at 07/21/21 2335     Allergies  Allergen Reactions   Penicillins Rash    Reaction to injection / not tablets    Signed: Morton Peters, MD, FACS, MASCRS Esophageal, Gastrointestinal & Colorectal Surgery Robotic and Minimally Invasive Surgery  [facility], [place]  Between. [address] [place], [place] [address] [phone] Fax [phone] Main  CONTACT INFORMATION:  Weekday (9AM-5PM): Call [facility] main office at [phone]  Weeknight (5PM-9AM) or Weekend/Holiday: Check  [email or web address] (password " [email or web address]") for General Surgery [facility] coverage  (Please, do not use SecureChat as it is not reliable communication to operating surgeons for immediate patient care)      07/22/2021, 8:22 AM

## 2021-07-22 NOTE — Discharge Instructions (Signed)
SURGERY: POST OP INSTRUCTIONS (Surgery for small bowel obstruction, colon resection, etc)   ######################################################################  EAT Gradually transition to a high fiber diet with a fiber supplement over the next few days after discharge  WALK Walk an hour a day.  Control your pain to do that.    CONTROL PAIN Control pain so that you can walk, sleep, tolerate sneezing/coughing, go up/down stairs.  HAVE A BOWEL MOVEMENT DAILY Keep your bowels regular to avoid problems.  OK to try a laxative to override constipation.  OK to use an antidairrheal to slow down diarrhea.  Call if not better after 2 tries  CALL IF YOU HAVE PROBLEMS/CONCERNS Call if you are still struggling despite following these instructions. Call if you have concerns not answered by these instructions  ######################################################################   DIET Follow a light diet the first few days at home.  Start with a bland diet such as soups, liquids, starchy foods, low fat foods, etc.  If you feel full, bloated, or constipated, stay on a ful liquid or pureed/blenderized diet for a few days until you feel better and no longer constipated. Be sure to drink plenty of fluids every day to avoid getting dehydrated (feeling dizzy, not urinating, etc.). Gradually add a fiber supplement to your diet over the next week.  Gradually get back to a regular solid diet.  Avoid fast food or heavy meals the first week as you are more likely to get nauseated. It is expected for your digestive tract to need a few months to get back to normal.  It is common for your bowel movements and stools to be irregular.  You will have occasional bloating and cramping that should eventually fade away.  Until you are eating solid food normally, off all pain medications, and back to regular activities; your bowels will not be normal. Focus on eating a low-fat, high fiber diet the rest of your life  (See Getting to [place], below).  CARE of your INCISION or WOUND It is good for closed incision and even open wounds to be washed every day.  Shower every day.  Short baths are fine.  Wash the incisions and wounds clean with soap & water.     If you have a closed incision(s), wash the incision with soap & water every day.  You may leave closed incisions open to air if it is dry.   You may cover the incision with clean gauze & replace it after your daily shower for comfort.  It is good for closed incisions and even open wounds to be washed every day.  Shower every day.  Short baths are fine.  Wash the incisions and wounds clean with soap & water.    You may leave closed incisions open to air if it is dry.   You may cover the incision with clean gauze & replace it after your daily shower for comfort.  STAPLES: You have skin staples.  Leave them in place & set up an appointment for them to be removed by a surgery office nurse ~10 days after surgery. = Next Monday 10/17   If you have an open wound with a wound vac, see wound vac care instructions.     ACTIVITIES as tolerated Start light daily activities --- self-care, walking, climbing stairs-- beginning the day after surgery.  Gradually increase activities as tolerated.  Control your pain to be active.  Stop when you are tired.  Ideally, walk several times a day, eventually an hour  a day.   Most people are back to most day-to-day activities in a few weeks.  It takes 4-8 weeks to get back to unrestricted, intense activity. If you can walk 30 minutes without difficulty, it is safe to try more intense activity such as jogging, treadmill, bicycling, low-impact aerobics, swimming, etc. Save the most intensive and strenuous activity for last (Usually 4-8 weeks after surgery) such as sit-ups, heavy lifting, contact sports, etc.  Refrain from any intense heavy lifting or straining until you are off narcotics for pain control.  You will have off  days, but things should improve week-by-week. DO NOT PUSH THROUGH PAIN.  Let pain be your guide: If it hurts to do something, don't do it.  Pain is your body warning you to avoid that activity for another week until the pain goes down. You may drive when you are no longer taking narcotic prescription pain medication, you can comfortably wear a seatbelt, and you can safely make sudden turns/stops to protect yourself without hesitating due to pain. You may have sexual intercourse when it is comfortable. If it hurts to do something, stop.  MEDICATIONS Take your usually prescribed home medications unless otherwise directed.   Blood thinners:  Usually you can restart any strong blood thinners after the second postoperative day.  It is OK to take aspirin right away.     If you are on strong blood thinners (warfarin/Coumadin, Plavix, Xerelto, Eliquis, Pradaxa, etc), discuss with your surgeon, medicine PCP, and/or cardiologist for instructions on when to restart the blood thinner & if blood monitoring is needed (PT/INR blood check, etc).     PAIN CONTROL Pain after surgery or related to activity is often due to strain/injury to muscle, tendon, nerves and/or incisions.  This pain is usually short-term and will improve in a few months.  To help speed the process of healing and to get back to regular activity more quickly, DO THE FOLLOWING THINGS TOGETHER: Increase activity gradually.  DO NOT PUSH THROUGH PAIN Use Ice and/or Heat Try Gentle Massage and/or Stretching Take over the counter pain medication Take Narcotic prescription pain medication for more severe pain  Good pain control = faster recovery.  It is better to take more medicine to be more active than to stay in bed all day to avoid medications.  Increase activity gradually Avoid heavy lifting at first, then increase to lifting as tolerated over the next 6 weeks. Do not "push through" the pain.  Listen to your body and avoid positions and  maneuvers than reproduce the pain.  Wait a few days before trying something more intense Walking an hour a day is encouraged to help your body recover faster and more safely.  Start slowly and stop when getting sore.  If you can walk 30 minutes without stopping or pain, you can try more intense activity (running, jogging, aerobics, cycling, swimming, treadmill, sex, sports, weightlifting, etc.) Remember: If it hurts to do it, then don't do it! Use Ice and/or Heat You will have swelling and bruising around the incisions.  This will take several weeks to resolve. Ice packs or heating pads (6-8 times a day, 30-60 minutes at a time) will help sooth soreness & bruising. Some people prefer to use ice alone, heat alone, or alternate between ice & heat.  Experiment and see what works best for you.  Consider trying ice for the first few days to help decrease swelling and bruising; then, switch to heat to help relax sore spots and speed  recovery. Shower every day.  Short baths are fine.  It feels good!  Keep the incisions and wounds clean with soap & water.   Try Gentle Massage and/or Stretching Massage at the area of pain many times a day Stop if you feel pain - do not overdo it Take over the counter pain medication This helps the muscle and nerve tissues become less irritable and calm down faster Choose ONE of the following over-the-counter anti-inflammatory medications: Acetaminophen 500mg  tabs (Tylenol) 1-2 pills with every meal and just before bedtime (avoid if you have liver problems or if you have acetaminophen in you narcotic prescription) Naproxen 220mg  tabs (ex. Aleve, Naprosyn) 1-2 pills twice a day (avoid if you have kidney, stomach, IBD, or bleeding problems) Ibuprofen 200mg  tabs (ex. Advil, Motrin) 3-4 pills with every meal and just before bedtime (avoid if you have kidney, stomach, IBD, or bleeding problems) Take with food/snack several times a day as directed for at least 2 weeks to help keep  pain / soreness down & more manageable. Take Narcotic prescription pain medication for more severe pain A prescription for strong pain control is often given to you upon discharge (for example: oxycodone/Percocet, hydrocodone/Norco/Vicodin, or tramadol/Ultram) Take your pain medication as prescribed. Be mindful that most narcotic prescriptions contain Tylenol (acetaminophen) as well - avoid taking too much Tylenol. If you are having problems/concerns with the prescription medicine (does not control pain, nausea, vomiting, rash, itching, etc.), please call us [phone] to see if we need to switch you to a different pain medicine that will work better for you and/or control your side effects better. If you need a refill on your pain medication, you must call the office before 4 pm and on weekdays only.  By federal law, prescriptions for narcotics cannot be called into a pharmacy.  They must be filled out on paper & picked up from our office by the patient or authorized caretaker.  Prescriptions cannot be filled after 4 pm nor on weekends.    WHEN TO CALL us [phone] Severe uncontrolled or worsening pain  Fever over 101 F (38.5 C) Concerns with the incision: Worsening pain, redness, rash/hives, swelling, bleeding, or drainage Reactions / problems with new medications (itching, rash, hives, nausea, etc.) Nausea and/or vomiting Difficulty urinating Difficulty breathing Worsening fatigue, dizziness, lightheadedness, blurred vision Other concerns If you are not getting better after two weeks or are noticing you are getting worse, contact our office [phone] for further advice.  We may need to adjust your medications, re-evaluate you in the office, send you to the emergency room, or see what other things we can do to help. The clinic staff is available to answer your questions during regular business hours (8:30am-5pm).  Please don't hesitate to call and ask to speak to one of our nurses  for clinical concerns.    A surgeon from [facility] is always on call at the hospitals 24 hours/day If you have a medical emergency, go to the nearest emergency room or call 911.  FOLLOW UP in our office One the day of your discharge from the hospital (or the next business weekday), please call [facility] to set up or confirm an appointment to see your surgeon in the office for a follow-up appointment.  Usually it is 2-3 weeks after your surgery.   If you have skin staples at your incision(s), let the office know so we can set up a time in the office for the nurse to  remove them (usually around 10 days after surgery). Make sure that you call for appointments the day of discharge (or the next business weekday) from the hospital to ensure a convenient appointment time. IF YOU HAVE DISABILITY OR FAMILY LEAVE FORMS, BRING THEM TO THE OFFICE FOR PROCESSING.  DO NOT GIVE THEM TO YOUR DOCTOR.  [facility] [address] [place], [place], [place]  [address] ? [phone] - Main [phone] - [place],  [phone] - Fax [email or web address]  GETTING TO GOOD BOWEL HEALTH. It is expected for your digestive tract to need a few months to get back to normal.  It is common for your bowel movements and stools to be irregular.  You will have occasional bloating and cramping that should eventually fade away.  Until you are eating solid food normally, off all pain medications, and back to regular activities; your bowels will not be normal.   Avoiding constipation The goal: ONE SOFT BOWEL MOVEMENT A DAY!    Drink plenty of fluids.  Choose water first. TAKE A FIBER SUPPLEMENT EVERY DAY THE REST OF YOUR LIFE During your first week back home, gradually add back a fiber supplement every day Experiment which form you can tolerate.   There are many forms such as powders, tablets, wafers, gummies, etc Psyllium bran (Metamucil), methylcellulose  (Citrucel), Miralax or Glycolax, Benefiber, Flax Seed.  Adjust the dose week-by-week (1/2 dose/day to 6 doses a day) until you are moving your bowels 1-2 times a day.  Cut back the dose or try a different fiber product if it is giving you problems such as diarrhea or bloating. Sometimes a laxative is needed to help jump-start bowels if constipated until the fiber supplement can help regulate your bowels.  If you are tolerating eating & you are farting, it is okay to try a gentle laxative such as double dose MiraLax, prune juice, or Milk of Magnesia.  Avoid using laxatives too often. Stool softeners can sometimes help counteract the constipating effects of narcotic pain medicines.  It can also cause diarrhea, so avoid using for too long. If you are still constipated despite taking fiber daily, eating solids, and a few doses of laxatives, call our office. Controlling diarrhea Try drinking liquids and eating bland foods for a few days to avoid stressing your intestines further. Avoid dairy products (especially milk & ice cream) for a short time.  The intestines often can lose the ability to digest lactose when stressed. Avoid foods that cause gassiness or bloating.  Typical foods include beans and other legumes, cabbage, broccoli, and dairy foods.  Avoid greasy, spicy, fast foods.  Every person has some sensitivity to other foods, so listen to your body and avoid those foods that trigger problems for you. Probiotics (such as active yogurt, Align, etc) may help repopulate the intestines and colon with normal bacteria and calm down a sensitive digestive tract Adding a fiber supplement gradually can help thicken stools by absorbing excess fluid and retrain the intestines to act more normally.  Slowly increase the dose over a few weeks.  Too much fiber too soon can backfire and cause cramping & bloating. It is okay to try and slow down diarrhea with a few doses of antidiarrheal medicines.   Bismuth  subsalicylate (ex. Kayopectate, Pepto Bismol) for a few doses can help control diarrhea.  Avoid if pregnant.   Loperamide (Imodium) can slow down diarrhea.  Start with one tablet (2mg ) first.  Avoid if you are having fevers  or severe pain.  ILEOSTOMY PATIENTS WILL HAVE CHRONIC DIARRHEA since their colon is not in use.    Drink plenty of liquids.  You will need to drink even more glasses of water/liquid a day to avoid getting dehydrated. Record output from your ileostomy.  Expect to empty the bag every 3-4 hours at first.  Most people with a permanent ileostomy empty their bag 4-6 times at the least.   Use antidiarrheal medicine (especially Imodium) several times a day to avoid getting dehydrated.  Start with a dose at bedtime & breakfast.  Adjust up or down as needed.  Increase antidiarrheal medications as directed to avoid emptying the bag more than 8 times a day (every 3 hours). Work with your wound ostomy nurse to learn care for your ostomy.  See ostomy care instructions. TROUBLESHOOTING IRREGULAR BOWELS 1) Start with a soft & bland diet. No spicy, greasy, or fried foods.  2) Avoid gluten/wheat or dairy products from diet to see if symptoms improve. 3) Miralax 17gm or flax seed mixed in [place]. water or juice-daily. May use 2-4 times a day as needed. 4) Gas-X, Phazyme, etc. as needed for gas & bloating.  5) Prilosec (omeprazole) over-the-counter as needed 6)  Consider probiotics (Align, Activa, etc) to help calm the bowels down  Call your doctor if you are getting worse or not getting better.  Sometimes further testing (cultures, endoscopy, X-ray studies, CT scans, bloodwork, etc.) may be needed to help diagnose and treat the cause of the diarrhea. [facility], [place], [place], [place], [place]  [address] [phone] - Main.    [phone]  - Toll Free.   [phone] - Fax [email or web address]

## 2021-07-22 NOTE — Progress Notes (Signed)
Pt alert and oriented, tolerating diet. D/C instructions given, pt d/cd home. 

## 2021-07-26 ENCOUNTER — Telehealth: Payer: Self-pay | Admitting: Internal Medicine

## 2021-07-26 NOTE — Telephone Encounter (Signed)
Received a phone call to the [place] [facility] pager. Patient states that she recently had surgery on 07/17/21. She would like to know if she can drink beers after her surgery. I encouraged her to avoid alcohol if possible. She expressed some frustration about not being able to drink beers. I encouraged her to reach out to her surgeons for details on whether or not she should abstain from alcohol postoperatively. Patient stated that she would reach out to her surgeon.

## 2021-07-29 LAB — SURGICAL PATHOLOGY

## 2021-07-30 ENCOUNTER — Other Ambulatory Visit: Payer: Self-pay

## 2021-07-30 ENCOUNTER — Telehealth: Payer: Self-pay | Admitting: Cardiovascular Disease

## 2021-07-30 MED ORDER — ELIQUIS 5 MG PO TABS: 5.0000 mg | ORAL_TABLET | Freq: Two times a day (BID) | ORAL | 1 refills | Status: DC

## 2021-07-30 NOTE — Telephone Encounter (Signed)
*  STAT* If patient is at the pharmacy, call can be transferred to refill team.   1. Which medications need to be refilled? (please list name of each medication and dose if known) ELIQUIS 5 MG TABS tablet  2. Which pharmacy/location (including street and city if local pharmacy) is medication to be sent to? [facility] ([facility] - [place], [address]  3. Do they need a 30 day or 90 day supply? 90 day

## 2021-07-30 NOTE — Telephone Encounter (Signed)
Sent in RX to pharmacy  ° °

## 2021-07-31 ENCOUNTER — Ambulatory Visit: Payer: Medicare HMO | Admitting: Oncology

## 2021-08-04 ENCOUNTER — Inpatient Hospital Stay: Payer: Medicare HMO | Attending: Oncology | Admitting: Oncology

## 2021-08-04 ENCOUNTER — Other Ambulatory Visit: Payer: Self-pay

## 2021-08-04 ENCOUNTER — Encounter: Payer: Self-pay | Admitting: *Deleted

## 2021-08-04 ENCOUNTER — Encounter (HOSPITAL_COMMUNITY): Payer: Self-pay

## 2021-08-04 VITALS — BP 137/78 | HR 75 | Temp 98.2°F | Resp 18 | Ht 61.0 in | Wt 123.4 lb

## 2021-08-04 DIAGNOSIS — I4891 Unspecified atrial fibrillation: Secondary | ICD-10-CM

## 2021-08-04 DIAGNOSIS — F17210 Nicotine dependence, cigarettes, uncomplicated: Secondary | ICD-10-CM

## 2021-08-04 DIAGNOSIS — Z803 Family history of malignant neoplasm of breast: Secondary | ICD-10-CM

## 2021-08-04 DIAGNOSIS — C186 Malignant neoplasm of descending colon: Secondary | ICD-10-CM | POA: Diagnosis not present

## 2021-08-04 DIAGNOSIS — Z7289 Other problems related to lifestyle: Secondary | ICD-10-CM

## 2021-08-04 DIAGNOSIS — J449 Chronic obstructive pulmonary disease, unspecified: Secondary | ICD-10-CM | POA: Diagnosis not present

## 2021-08-04 DIAGNOSIS — I10 Essential (primary) hypertension: Secondary | ICD-10-CM | POA: Diagnosis not present

## 2021-08-04 NOTE — Progress Notes (Signed)
[place] [facility] Patient Consult   Requesting MD: Jackquline Denmark, [place],  [place] [id number]   Kelsey Bridges 70 y.o.  [birth year]    Reason for Consult: Colon cancer   HPI: Kelsey Bridges had a positive FIT test in late March.  She was referred to Dr. Lyndel Safe on 02/14/2021.  She complained of intermittent nausea.  A CBC on 04/03/2021 found the hemoglobin at 9 with an MCV of 73.3.  And upper endoscopy on 04/03/2021 revealed localized mild inflammation in the gastric body.  A biopsy was obtained.  A duodenal biopsy was obtained for evaluation of celiac disease.  A colonoscopy 04/03/2021 revealed a partially obstructing mass in the mid descending colon.  The mass was biopsied and the area was tattooed.  Polyps were removed from the proximal ascending colon, transverse colon, and splenic fracture. 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  The colon polyps returned as tubular adenomas.  The descending colon mass was invasive adenocarcinoma.   CTs of the chest, abdomen, and pelvis on 04/18/2021 revealed a circumferential mass in the descending colon.  No evidence of obstruction or perforation.  No evidence of metastatic disease.  Changes of emphysema.  She was referred to Dr. Dema Severin and was taken to a robotic assisted left hemicolectomy on 07/17/2021.  A tumor was noted in the descending colon.  No evidence of metastatic disease.  There was involvement of the mesentery either by direct extension or bulky adenopathy.  There was no involvement of the left ureter or left kidney.  The pathology confirmed a moderately differentiated adenocarcinoma of the descending colon.  Tumor invaded through the muscularis propria.  0 of 12 lymph nodes contained metastatic carcinoma.  No lymphovascular or perineural invasion.  The resection margins are negative.  No macroscopic tumor perforation.  2 tumor deposits.  The tumor was staged as a  pT3pN1c colon cancer.  There is loss of [place] 2 and [place] 6 expression.  The tumor returned MSI high.  Kelsey Bridges is referred for oncology evaluation.  She is having bowel movements. Past Medical History:  Diagnosis Date   Abnormal Pap smear    Allergy    Atrial fibrillation (HCC)    Dysrhythmia    Hammer toe    Hypertension     .  G2 P1, 1 miscarriage   .  Knee arthritis   .  COPD on CT 04/18/2021   .  Helicobacter pylori June 2022 Past Surgical History:  Procedure Laterality Date   ABDOMINAL HYSTERECTOMY     FLEXIBLE SIGMOIDOSCOPY N/A 07/17/2021   Procedure: FLEXIBLE SIGMOIDOSCOPY;  Surgeon: Ileana Roup, MD;  Location: [facility];  Service: General;  Laterality: N/A;   HAMMER TOE SURGERY Right 2019   LAPAROSCOPIC SUPRACERVICAL HYSTERECTOMY     left ovary  10/12/1986   Varicose veins      Medications: Reviewed  Allergies:  Allergies  Allergen Reactions   Penicillins Rash    Reaction to injection / not tablets    Family history: A sister had breast cancer  Social History:   She lives alone in [place].  She is retired Quarry manager.  She smokes cigarettes.  She drinks beer.  She was transfused with red blood cells in the past-she believes this may have been when she had a hysterectomy.  No risk factor for HIV or hepatitis.  She has received COVID-19 vaccines.  ROS:   Positives include: Knee discomfort, "knot "at the  right lateral chest wall-decreased in size  A complete ROS was otherwise negative.  Physical Exam:  Blood pressure 137/78, pulse 75, temperature 98.2 F (36.8 C), temperature source Oral, resp. rate 18, height '5\' 1"'  (1.549 m), weight 123 lb 6.4 oz (56 kg), SpO2 100 %.  Lungs: Bronchial sounds at the lower posterior chest bilaterally, no respiratory distress Cardiac: Regular rate and rhythm Abdomen: No hepatosplenomegaly, no mass, nontender, healed trocar incisions, healing midline incision with Steri-Strips in place Vascular: No leg edema, enlarged  superficial veins at the upper lateral abdomen/chest wall Lymph nodes: No cervical, supraclavicular, or inguinal nodes.  "Shotty "bilateral axillary nodes Neurologic: Alert and oriented, the motor exam appears intact in the upper and lower extremities bilaterally Skin: Areas of vitiligo at the left lower leg, oval 1-2 cm soft mobile cutaneous lesion at the right lateral chest wall Musculoskeletal: No spine tenderness   LAB:  CBC  Lab Results  Component Value Date   WBC 7.9 07/22/2021   HGB 8.9 (L) 07/22/2021   HCT 28.3 (L) 07/22/2021   MCV 80.2 07/22/2021   PLT 347 07/22/2021   NEUTROABS 6.6 07/07/2021        CMP  Lab Results  Component Value Date   NA 139 07/21/2021   K 4.5 07/22/2021   CL 107 07/21/2021   CO2 27 07/21/2021   GLUCOSE 125 (H) 07/21/2021   BUN 13 07/21/2021   CREATININE 0.88 07/22/2021   CALCIUM 8.7 (L) 07/21/2021   PROT 7.6 07/07/2021   ALBUMIN 3.6 07/07/2021   AST 12 (L) 07/07/2021   ALT 9 07/07/2021   ALKPHOS 66 07/07/2021   BILITOT 0.5 07/07/2021   GFRNONAA >60 07/22/2021   GFRAA 84 06/28/2020   CEA on 04/03/2021: 87.6   Imaging: As per HPI-CT images from 04/18/2021 reviewed    Assessment/Plan:   Colon cancer-descending, stage IIIb (pT3pN1c), status post a left colectomy 07/17/2021 0/12 lymph nodes, 2 tumor deposits, no lymphovascular perineural invasion MSI-high, loss of MSH2 and MSH6 expression Colonoscopy 04/03/2021-partially obstructing descending colon mass, adenocarcinoma CTs 04/18/2021-descending colon mass, no evidence of metastatic disease, emphysema Elevated preoperative CEA  Atrial fibrillation Tobacco use 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 pylori June 2022 Hypertension    Disposition:   Kelsey Bridges has been diagnosed with stage III colon cancer.  I reviewed details of the surgery pathology report with Kelsey Bridges today.  We discussed the prognosis and adjuvant systemic treatment options.  The tumor returned MSI high with loss of  MSH2 and MSH6 expression.  Kelsey Bridges will be referred to the genetics counselor as she may have a hereditary colon cancer syndrome.  I recommend adjuvant systemic chemotherapy in an attempt to decrease the chance of developing recurrent colon cancer.  We discussed the expected benefit of adjuvant chemotherapy in patients with resected stage III colon cancer.  We discussed the lack of clear benefit with single agent 5-fluorouracil in patients with MSI high tumors.  Ms. Bin will return in 1 week for further discussion.  I will recommend adjuvant CAPOX or FOLFOX chemotherapy for 3 months.  She understands her family members are at increased risk of developing colorectal cancer and should receive appropriate screening.  Betsy Coder, MD  08/04/2021, 2:58 PM

## 2021-08-04 NOTE — Progress Notes (Signed)
PATIENT NAVIGATOR PROGRESS NOTE  Name: Kelsey Bridges Date: 08/04/2021 MRN: [id number]  DOB: February 01, 1951   Reason for visit:  Initial visit with Dr Benay Spice  Comments:  Met with Kelsey Bridges during her visit with Dr Benay Spice after surgery 07/17/21, she is healing well after surgery. Today she met with Dr Benay Spice to discuss pathology and next steps in cancer care. An urgent referral sent for Genetics, pathology sent for MSI testing, transportation referral sent as well as financial advocate. She will return to clinic next week to discuss further pathology testing as well as genetics testing for Lynch Syndrome. Verbalized understanding and will continue to support patient in her cancer care.     Time spent counseling/coordinating care: > 60 minutes

## 2021-08-05 ENCOUNTER — Encounter: Payer: Self-pay | Admitting: *Deleted

## 2021-08-05 ENCOUNTER — Other Ambulatory Visit: Payer: Self-pay | Admitting: Genetic Counselor

## 2021-08-05 DIAGNOSIS — C186 Malignant neoplasm of descending colon: Secondary | ICD-10-CM

## 2021-08-05 NOTE — Progress Notes (Signed)
PATIENT NAVIGATOR PROGRESS NOTE  Name: Kelsey Bridges Date: 08/05/2021 MRN: [id number]  DOB: 26-Nov-1950   Reason for visit:  Phone call to go over new appointments  Comments:  Called Ms. Kelsey Bridges and reviewed upcoming appt and [employer].  She has F/U with Dr Dema Severin on 10/26 and then Genetic counseling on 10/27 at [facility]. She has transportation set up with Prudencio Pair for Dr Dema Severin appt and [employer] on 10/27. We reviewed what to expect with appt with Genetic counselor, and then next week with Dr Benay Spice. Pt has [facility] phone number and also has contact number to navigator to call with any questions    Time spent counseling/coordinating care: 15-30 minutes

## 2021-08-06 ENCOUNTER — Other Ambulatory Visit: Payer: Medicare HMO

## 2021-08-06 ENCOUNTER — Other Ambulatory Visit: Payer: Self-pay

## 2021-08-06 NOTE — Progress Notes (Signed)
The proposed treatment discussed in conference is for discussion purpose only and is not a binding recommendation.  The patients have not been physically examined, or presented with their treatment options.  Therefore, final treatment plans cannot be decided.  

## 2021-08-07 ENCOUNTER — Inpatient Hospital Stay: Payer: Medicare HMO

## 2021-08-07 ENCOUNTER — Other Ambulatory Visit: Payer: Self-pay

## 2021-08-07 ENCOUNTER — Inpatient Hospital Stay (HOSPITAL_BASED_OUTPATIENT_CLINIC_OR_DEPARTMENT_OTHER): Payer: Medicare HMO | Admitting: Genetic Counselor

## 2021-08-07 ENCOUNTER — Encounter: Payer: Self-pay | Admitting: Genetic Counselor

## 2021-08-07 DIAGNOSIS — C186 Malignant neoplasm of descending colon: Secondary | ICD-10-CM

## 2021-08-07 DIAGNOSIS — Z803 Family history of malignant neoplasm of breast: Secondary | ICD-10-CM

## 2021-08-07 DIAGNOSIS — Z7183 Encounter for nonprocreative genetic counseling: Secondary | ICD-10-CM | POA: Diagnosis not present

## 2021-08-07 LAB — GENETIC SCREENING ORDER

## 2021-08-07 NOTE — Progress Notes (Signed)
REFERRING PROVIDER: Ladell Pier, MD [address] [place],  [place] [address]  PRIMARY PROVIDER:  Delford Field, FNP  PRIMARY REASON FOR VISIT:  1. Family history of breast cancer   2. 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 Calderone, a 70 y.o. female, was seen for a  cancer genetics consultation at the request of Dr. Benay Spice due to a personal and family history of cancer.  Ms. Mcquerry presents to clinic today to discuss the possibility of a hereditary predisposition to cancer, genetic testing, and to further clarify her future cancer risks, as well as potential cancer risks for family members.   In October 2022, at the age of 88, Ms. Rothgeb was diagnosed with cancer of the descending colon. The treatment plan included surgery.  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  First live birth at age 6.  OCP use for approximately 0 years.  Ovaries intact: right ovary.  Hysterectomy: yes.  Menopausal status: postmenopausal.  HRT use:  unknown number of  years. Colonoscopy: yes;  colon cancer identified with 4 polyps . Mammogram within the last year: yes. Number of breast  biopsies: 0. Up to date with pelvic exams: n/a. Any excessive radiation exposure in the past: no  Past Medical History:  Diagnosis Date   Abnormal Pap smear    Allergy    Atrial fibrillation ([place])    Dysrhythmia    Family history of breast cancer    Hammer toe    Hypertension     Past Surgical History:  Procedure Laterality Date   ABDOMINAL HYSTERECTOMY     FLEXIBLE SIGMOIDOSCOPY N/A 07/17/2021   Procedure: FLEXIBLE SIGMOIDOSCOPY;  Surgeon: Ileana Roup, MD;  Location: [facility];  Service: General;  Laterality: N/A;   HAMMER TOE SURGERY Right 2019   LAPAROSCOPIC SUPRACERVICAL HYSTERECTOMY     left ovary  10/12/1986   Varicose veins      Social History   Socioeconomic History   Marital status: Widowed    Spouse name: Not on file   Number of children: Not on file   Years of education: Not on file   Highest education level: Not on file  Occupational History   Not on file  Tobacco Use   Smoking status: Every Day    Packs/day: 0.20    Types: Cigarettes   Smokeless tobacco: Current    Types: Snuff  Vaping Use   Vaping Use: Never used  Substance and Sexual Activity   Alcohol use: Yes    Comment: "at the holidays maybe"   Drug use: No   Sexual activity: Not Currently    Birth control/protection: Surgical  Other Topics Concern   Not on file  Social History Narrative  Not on file   Social Determinants of Health   Financial Resource Strain: Not on file  Food Insecurity: Not on file  Transportation Needs: Not on file  Physical Activity: Not on file  Stress: Not on file  Social Connections: Not on file     FAMILY HISTORY:  We obtained a detailed, 4-generation family history.  Significant diagnoses are listed below: Family History  Adopted: Yes  Problem Relation Age of Onset   Cancer Sister        breast   Cancer Brother        liver     The patient has one daughter who is cancer free.  She is adopted, but lived down the street from her biological  family.  She has two full siblings and five maternal half siblings.  One half sister had breast cancer.  Both biological parents are deceased.  There is no further information known about the extended family.  Ms. Cannell is unaware of previous family history of genetic testing for hereditary cancer risks. Patient's maternal ancestors are of African American descent, and paternal ancestors are of African American descent. There is no reported Ashkenazi Jewish ancestry. There is no known consanguinity.  GENETIC COUNSELING ASSESSMENT: Ms. Pinheiro is a 70 y.o. female with a personal and family history of cancer which is somewhat suggestive of a hereditary cancer syndrome, such as Lynch syndrome, and predisposition to cancer given her MSI/IHC testing. We, therefore, discussed and recommended the following at today's visit.   DISCUSSION: We discussed that, in general, most cancer is not inherited in families, but instead is sporadic or familial. Sporadic cancers occur by chance and typically happen at older ages (>50 years) as this type of cancer is caused by genetic changes acquired during an individual's lifetime. Some families have more cancers than would be expected by chance; however, the ages or types of cancer are not consistent with a known genetic mutation or known genetic mutations have been ruled out. This type of familial cancer is thought to be due to a combination of multiple genetic, environmental, hormonal, and lifestyle factors. While this combination of factors likely increases the risk of cancer, the exact source of this risk is not currently identifiable or testable.  We discussed that 5 - 7% of colon cancer is hereditary, with most cases associated with Lynch syndrome.  Based on her tumor testing we are most concerned with a hereditary mutation in either MSH2 or MSH6.  There are other genes that can be associated with hereditary colon cancer syndromes.  These include APC, MUTYH and  others.  We discussed that testing is beneficial for several reasons including knowing how to follow individuals after completing their treatment, identifying whether potential treatment options such as Beryle Flock would be beneficial, and understand if other family members could be at risk for cancer and allow them to undergo genetic testing.   We reviewed the characteristics, features and inheritance patterns of hereditary cancer syndromes. We also discussed genetic testing, including the appropriate family members to test, the process of testing, insurance coverage and turn-around-time for results. We discussed the implications of a negative, positive, carrier and/or variant of uncertain significant result. We recommended Ms. Benedick pursue genetic testing for the CancerNext-Expanded+RNAinsight gene panel.   The CancerNext-Expanded gene panel offered by [facility] and includes sequencing and rearrangement analysis for the following 77 genes: AIP, ALK, APC*, ATM*, AXIN2, BAP1, BARD1, BLM, BMPR1A, BRCA1*, BRCA2*, BRIP1*, CDC73, CDH1*, CDK4, CDKN1B, CDKN2A, CHEK2*, CTNNA1, DICER1, FANCC, FH,  FLCN, GALNT12, KIF1B, LZTR1, MAX, MEN1, MET, MLH1*, MSH2*, MSH3, MSH6*, MUTYH*, NBN, NF1*, NF2, NTHL1, PALB2*, PHOX2B, PMS2*, POT1, PRKAR1A, PTCH1, PTEN*, RAD51C*, RAD51D*, RB1, RECQL, RET, SDHA, SDHAF2, SDHB, SDHC, SDHD, SMAD4, SMARCA4, SMARCB1, SMARCE1, STK11, SUFU, TMEM127, TP53*, TSC1, TSC2, VHL and XRCC2 (sequencing and deletion/duplication); EGFR, EGLN1, HOXB13, KIT, MITF, PDGFRA, POLD1, and POLE (sequencing only); EPCAM and GREM1 (deletion/duplication only). DNA and RNA analyses performed for * genes.   Based on Ms. Skluzacek's personal and family history of cancer, she meets medical criteria for genetic testing. Despite that she meets criteria, she may still have an out of pocket cost. We discussed that if her out of pocket cost for testing is over $100, the laboratory will call and confirm whether she wants  to proceed with testing.  If the out of pocket cost of testing is less than $100 she will be billed by the genetic testing laboratory.   PLAN: After considering the risks, benefits, and limitations, Ms. Lai provided informed consent to pursue genetic testing and the blood sample was sent to [facility] for analysis of the CancerNext-Expanded+RNAinsight. Results should be available within approximately 2-3 weeks' time, at which point they will be disclosed by telephone to Ms. Foxworthy, as will any additional recommendations warranted by these results. Ms. Sellin will receive a summary of her genetic counseling visit and a copy of her results once available. This information will also be available in Epic.   Lastly, we encouraged Ms. Keagle to remain in contact with cancer genetics annually so that we can continuously update the family history and inform her of any changes in cancer genetics and testing that may be of benefit for this family.   Ms. Archambault questions were answered to her satisfaction today. Our contact information was provided should additional questions or concerns arise. Thank you for the referral and allowing [place] to share in the care of your patient.   Marcquis Ridlon P. Florene Glen, [place], [facility] Licensed, Insurance risk surveyor [email or web address] phone: [phone]  The patient was seen for a total of 30 minutes in face-to-face genetic counseling.  The patient was seen alone.  This patient was discussed with Drs. Magrinat, Lindi Adie and/or Burr Medico who agrees with the above.    _______________________________________________________________________ For Office Staff:  Number of people involved in session: 1 Was an Intern/ student involved with case: no

## 2021-08-08 ENCOUNTER — Encounter: Payer: Self-pay | Admitting: *Deleted

## 2021-08-13 ENCOUNTER — Telehealth: Payer: Self-pay

## 2021-08-13 ENCOUNTER — Inpatient Hospital Stay: Payer: Medicare HMO | Attending: Oncology | Admitting: Oncology

## 2021-08-13 DIAGNOSIS — J449 Chronic obstructive pulmonary disease, unspecified: Secondary | ICD-10-CM | POA: Insufficient documentation

## 2021-08-13 DIAGNOSIS — I4891 Unspecified atrial fibrillation: Secondary | ICD-10-CM | POA: Insufficient documentation

## 2021-08-13 DIAGNOSIS — Z5111 Encounter for antineoplastic chemotherapy: Secondary | ICD-10-CM | POA: Insufficient documentation

## 2021-08-13 DIAGNOSIS — Z79899 Other long term (current) drug therapy: Secondary | ICD-10-CM | POA: Insufficient documentation

## 2021-08-13 DIAGNOSIS — Z9049 Acquired absence of other specified parts of digestive tract: Secondary | ICD-10-CM | POA: Insufficient documentation

## 2021-08-13 DIAGNOSIS — I10 Essential (primary) hypertension: Secondary | ICD-10-CM | POA: Insufficient documentation

## 2021-08-13 DIAGNOSIS — Z720 Tobacco use: Secondary | ICD-10-CM | POA: Insufficient documentation

## 2021-08-13 DIAGNOSIS — R970 Elevated carcinoembryonic antigen [CEA]: Secondary | ICD-10-CM | POA: Insufficient documentation

## 2021-08-13 DIAGNOSIS — C186 Malignant neoplasm of descending colon: Secondary | ICD-10-CM | POA: Insufficient documentation

## 2021-08-13 NOTE — Telephone Encounter (Signed)
V/M message from Pt stating she had an appointment today but transportation had the wrong address. Scheduling will return call to Pt to reschedule.

## 2021-08-19 ENCOUNTER — Other Ambulatory Visit: Payer: Self-pay | Admitting: *Deleted

## 2021-08-19 ENCOUNTER — Inpatient Hospital Stay: Payer: Medicare HMO

## 2021-08-19 ENCOUNTER — Other Ambulatory Visit (HOSPITAL_BASED_OUTPATIENT_CLINIC_OR_DEPARTMENT_OTHER): Payer: Self-pay

## 2021-08-19 ENCOUNTER — Telehealth: Payer: Self-pay | Admitting: *Deleted

## 2021-08-19 ENCOUNTER — Other Ambulatory Visit: Payer: Self-pay

## 2021-08-19 ENCOUNTER — Inpatient Hospital Stay (HOSPITAL_BASED_OUTPATIENT_CLINIC_OR_DEPARTMENT_OTHER): Payer: Medicare HMO | Admitting: Oncology

## 2021-08-19 ENCOUNTER — Encounter: Payer: Self-pay | Admitting: *Deleted

## 2021-08-19 ENCOUNTER — Ambulatory Visit: Payer: Medicare HMO | Attending: Internal Medicine

## 2021-08-19 VITALS — BP 148/93 | HR 88 | Temp 98.2°F | Resp 18 | Ht 61.0 in | Wt 120.6 lb

## 2021-08-19 DIAGNOSIS — Z79899 Other long term (current) drug therapy: Secondary | ICD-10-CM | POA: Diagnosis not present

## 2021-08-19 DIAGNOSIS — Z5111 Encounter for antineoplastic chemotherapy: Secondary | ICD-10-CM | POA: Diagnosis present

## 2021-08-19 DIAGNOSIS — I10 Essential (primary) hypertension: Secondary | ICD-10-CM | POA: Diagnosis not present

## 2021-08-19 DIAGNOSIS — C186 Malignant neoplasm of descending colon: Secondary | ICD-10-CM

## 2021-08-19 DIAGNOSIS — Z9049 Acquired absence of other specified parts of digestive tract: Secondary | ICD-10-CM | POA: Diagnosis not present

## 2021-08-19 DIAGNOSIS — Z23 Encounter for immunization: Secondary | ICD-10-CM

## 2021-08-19 DIAGNOSIS — R970 Elevated carcinoembryonic antigen [CEA]: Secondary | ICD-10-CM | POA: Diagnosis not present

## 2021-08-19 DIAGNOSIS — I4891 Unspecified atrial fibrillation: Secondary | ICD-10-CM | POA: Diagnosis not present

## 2021-08-19 DIAGNOSIS — J449 Chronic obstructive pulmonary disease, unspecified: Secondary | ICD-10-CM | POA: Diagnosis not present

## 2021-08-19 DIAGNOSIS — Z720 Tobacco use: Secondary | ICD-10-CM | POA: Diagnosis not present

## 2021-08-19 LAB — CMP (CANCER CENTER ONLY)
ALT: 7 U/L (ref 0–44)
AST: 13 U/L — ABNORMAL LOW (ref 15–41)
Albumin: 4.3 g/dL (ref 3.5–5.0)
Alkaline Phosphatase: 66 U/L (ref 38–126)
Anion gap: 12 (ref 5–15)
BUN: 11 mg/dL (ref 8–23)
CO2: 25 mmol/L (ref 22–32)
Calcium: 9.6 mg/dL (ref 8.9–10.3)
Chloride: 101 mmol/L (ref 98–111)
Creatinine: 1 mg/dL (ref 0.44–1.00)
GFR, Estimated: >60 mL/min (ref >60)
Glucose, Bld: 121 mg/dL — ABNORMAL HIGH (ref 70–99)
Potassium: 3.9 mmol/L (ref 3.5–5.1)
Sodium: 138 mmol/L (ref 135–145)
Total Bilirubin: 0.4 mg/dL (ref 0.3–1.2)
Total Protein: 7.4 g/dL (ref 6.5–8.1)

## 2021-08-19 LAB — CBC WITH DIFFERENTIAL (CANCER CENTER ONLY)
Abs Immature Granulocytes: 0.02 10*3/uL (ref 0.00–0.07)
Basophils Absolute: 0 10*3/uL (ref 0.0–0.1)
Basophils Relative: 0 %
Eosinophils Absolute: 0 10*3/uL (ref 0.0–0.5)
Eosinophils Relative: 0 %
HCT: 35.2 % — ABNORMAL LOW (ref 36.0–46.0)
Hemoglobin: 10.8 g/dL — ABNORMAL LOW (ref 12.0–15.0)
Immature Granulocytes: 0 %
Lymphocytes Relative: 30 %
Lymphs Abs: 1.5 10*3/uL (ref 0.7–4.0)
MCH: 24.9 pg — ABNORMAL LOW (ref 26.0–34.0)
MCHC: 30.7 g/dL (ref 30.0–36.0)
MCV: 81.1 fL (ref 80.0–100.0)
Monocytes Absolute: 0.5 10*3/uL (ref 0.1–1.0)
Monocytes Relative: 10 %
Neutro Abs: 3 10*3/uL (ref 1.7–7.7)
Neutrophils Relative %: 60 %
Platelet Count: 272 10*3/uL (ref 150–400)
RBC: 4.34 MIL/uL (ref 3.87–5.11)
RDW: 17.2 % — ABNORMAL HIGH (ref 11.5–15.5)
WBC Count: 5.1 10*3/uL (ref 4.0–10.5)
nRBC: 0 % (ref 0.0–0.2)

## 2021-08-19 LAB — CEA (ACCESS): CEA (CHCC): 10.18 ng/mL — ABNORMAL HIGH (ref 0.00–5.00)

## 2021-08-19 MED ORDER — LIDOCAINE-PRILOCAINE 2.5-2.5 % EX CREA: 1.0000 "application " | TOPICAL_CREAM | CUTANEOUS | 0 refills | Status: DC | PRN

## 2021-08-19 MED ORDER — PROCHLORPERAZINE MALEATE 10 MG PO TABS: 10.0000 mg | ORAL_TABLET | Freq: Four times a day (QID) | ORAL | 0 refills | Status: DC | PRN

## 2021-08-19 MED ORDER — PFIZER COVID-19 VAC BIVALENT 30 MCG/0.3ML IM SUSP
INTRAMUSCULAR | 0 refills | Status: DC
  Filled 2021-08-19: qty 0.3, 1d supply, fill #0

## 2021-08-19 NOTE — Progress Notes (Signed)
PATIENT NAVIGATOR PROGRESS NOTE  Name: Kelsey Bridges Date: 08/19/2021 MRN: [id number]  DOB: 02/23/1951   Reason for visit:  F/U appt with Dr Benay Spice  Comments:  Met with Kelsey Bridges during visit with Dr Benay Spice to discuss treatment options. We discussed PAC placement and treatment FOLFOX,to begin next week. Orders placed for IR to place PAC due to surgeon unavailable. Kelsey Bridges attended patient ed session to discuss chemo and managing side effects. Lab work done today and she went to the [employer] and received COVID booster    Time spent counseling/coordinating care: > 60 minutes

## 2021-08-19 NOTE — Progress Notes (Signed)

## 2021-08-19 NOTE — Progress Notes (Signed)
  [place] OFFICE PROGRESS NOTE   Diagnosis: Colon cancer  INTERVAL HISTORY:   Kelsey Bridges returns after missing a scheduled appointment last week.  No complaint today.  She is here to discuss adjuvant treatment options.  She has seen the genetics counselor.  Germline genetic testing is pending.  Objective:  Vital signs in last 24 hours:  Blood pressure (!) 148/93, pulse 88, temperature 98.2 F (36.8 C), temperature source Oral, resp. rate 18, height $RemoveBe'5\' 1"'klqiSMXWg$  (1.549 m), weight 120 lb 9.6 oz (54.7 kg), SpO2 98 %.    Resp: Lungs clear bilaterally Cardio: Regular rate and rhythm GI: No hepatosplenomegaly, no mass, healed surgical incisions Vascular: No leg edema   Lab Results:  Lab Results  Component Value Date   WBC 5.1 08/19/2021   HGB 10.8 (L) 08/19/2021   HCT 35.2 (L) 08/19/2021   MCV 81.1 08/19/2021   PLT 272 08/19/2021   NEUTROABS 3.0 08/19/2021    CMP  Lab Results  Component Value Date   NA 138 08/19/2021   K 3.9 08/19/2021   CL 101 08/19/2021   CO2 25 08/19/2021   GLUCOSE 121 (H) 08/19/2021   BUN 11 08/19/2021   CREATININE 1.00 08/19/2021   CALCIUM 9.6 08/19/2021   PROT 7.4 08/19/2021   ALBUMIN 4.3 08/19/2021   AST 13 (L) 08/19/2021   ALT 7 08/19/2021   ALKPHOS 66 08/19/2021   BILITOT 0.4 08/19/2021   GFRNONAA >60 08/19/2021   GFRAA 84 06/28/2020    Lab Results  Component Value Date   CEA 10.18 (H) 08/19/2021    Medications: I have reviewed the patient's current medications.   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 0/12 lymph nodes, 2 tumor deposits, no lymphovascular perineural invasion MSI-high, loss of MSH2 and MSH6 expression Colonoscopy 04/03/2021-partially obstructing descending colon mass, adenocarcinoma CTs 04/18/2021-descending colon mass, no evidence of metastatic disease, emphysema Elevated preoperative CEA  Atrial fibrillation Tobacco use COPD on CT  04/18/2021 H. pylori June 2022 Hypertension      Disposition: Kelsey Bridges has been diagnosed with stage III colon cancer.  The tumor has an MSI high phenotype.  Germline molecular testing is pending.  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  I discussed adjuvant treatment options with her again today.  Kelsey Bridges has comorbid conditions and transportation Difficulty.  We Discussed the ATOMIC study.  I think it would be very difficult for Kelsey Bridges to participate in the clinical trial.  I do not feel she will need 6 months of adjuvant therapy.  We decided against referring her for the clinical trial.  I recommend FOLFOX chemotherapy for at least 3 months.  We reviewed potential toxicities associated with the FOLFOX regimen including the chance of nausea/vomiting, mucositis, diarrhea, alopecia, and hematologic toxicity.  We discussed the sun sensitivity, rash, hyperpigmentation, and hand/foot syndrome associated with 5-fluorouracil.  We discussed the allergic reaction and various types of neuropathy associated with oxaliplatin.  She agrees to proceed.  She will attend a chemotherapy teaching class today.  Kelsey Bridges will be referred for Port-A-Cath placement with the plan to begin FOLFOX within the next 2 weeks.  Betsy Coder, MD  08/19/2021  5:13 PM

## 2021-08-19 NOTE — Progress Notes (Signed)
Orders entered for [facility] to be placed for chemotherapy to begin on 11/15

## 2021-08-19 NOTE — Progress Notes (Signed)
   Covid-19 Vaccination Clinic  Name:  Kelsey Bridges    MRN: [id number] DOB: July 15, 1951  08/19/2021  [name] was observed post Covid-19 immunization for 15 minutes without incident. She was provided with Vaccine Information Sheet and instruction to access the V-Safe system.   [name] was instructed to call 911 with any severe reactions post vaccine: Difficulty breathing  Swelling of face and throat  A fast heartbeat  A bad rash all over body  Dizziness and weakness   Immunizations Administered     Name Date Dose VIS Date Route   Pfizer Covid-19 Vaccine Bivalent Booster 08/19/2021 10:43 AM 0.3 mL 06/11/2021 Intramuscular   Manufacturer: [place]   Lot: [id number]   [place]: [phone]

## 2021-08-20 ENCOUNTER — Telehealth: Payer: Self-pay | Admitting: Oncology

## 2021-08-20 ENCOUNTER — Encounter: Payer: Self-pay | Admitting: Genetic Counselor

## 2021-08-20 ENCOUNTER — Encounter: Payer: Self-pay | Admitting: *Deleted

## 2021-08-20 ENCOUNTER — Telehealth (HOSPITAL_COMMUNITY): Payer: Self-pay

## 2021-08-20 DIAGNOSIS — Z1379 Encounter for other screening for genetic and chromosomal anomalies: Secondary | ICD-10-CM | POA: Insufficient documentation

## 2021-08-20 NOTE — Progress Notes (Signed)
Message left for Pt to return call to [place]

## 2021-08-20 NOTE — Progress Notes (Signed)
PATIENT NAVIGATOR PROGRESS NOTE  Name: Kelsey Bridges Date: 08/20/2021 MRN: [id number]  DOB: 13-Sep-1951   Reason for visit:  Phone visit for Port placement  Comments:  Spoke with [facility] IR and Ms Sookdeo and coordinated care for [facility] placement on 11/15 with arrival at [address] [facility]. Nothing to eat or drink after midnight, must have driver and some one to stay with her for 24 hours.  Transportation set up for pickup at 7:15    Time spent counseling/coordinating care: 15-30 minutes

## 2021-08-20 NOTE — Telephone Encounter (Signed)
Scheduled appt per 11/8 los - spoke with patient and she is aware of appts on 09/03/21 and 09/05/21 ([facility]) also mailed reminder letter.

## 2021-08-20 NOTE — Telephone Encounter (Signed)
Called to schedule port placement, no answer, no vm. AW

## 2021-08-21 ENCOUNTER — Telehealth: Payer: Self-pay

## 2021-08-21 ENCOUNTER — Telehealth: Payer: Self-pay | Admitting: Genetic Counselor

## 2021-08-21 NOTE — Telephone Encounter (Signed)
LM on VM that results are back and to please call. 

## 2021-08-21 NOTE — Telephone Encounter (Signed)
-----   Message from Ladell Pier, MD sent at 08/19/2021  8:41 PM EST ----- CEA still mildly elevated, likely secondary to long half-life of CEA, repeat CEA day of first treatment

## 2021-08-21 NOTE — Telephone Encounter (Signed)
Revealed negative genetic testing.  Discussed that we do not know why she has colon cancer or why there is cancer in the family. It could be due to a different gene that we are not testing, or maybe our current technology may not be able to pick something up.  It will be important for her to keep in contact with genetics to keep up with whether additional testing may be needed.

## 2021-08-21 NOTE — Telephone Encounter (Signed)
V/M for Pt to return call to [facility] in reference to blood test results.

## 2021-08-22 ENCOUNTER — Ambulatory Visit: Payer: Self-pay | Admitting: Genetic Counselor

## 2021-08-22 DIAGNOSIS — Z1379 Encounter for other screening for genetic and chromosomal anomalies: Secondary | ICD-10-CM

## 2021-08-22 NOTE — Progress Notes (Signed)
HPI:  Kelsey Bridges was previously seen in the [place] clinic due to a personal and family history of cancer and concerns regarding a hereditary predisposition to cancer. Please refer to our prior cancer genetics clinic note for more information regarding our discussion, assessment and recommendations, at the time. Kelsey Bridges recent genetic test results were disclosed to her, as were recommendations warranted by these results. These results and recommendations are discussed in more detail below.  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  The report date is 08/19/2021  The CancerNext-Expanded gene panel offered by [facility] and includes sequencing and rearrangement analysis for the following 77 genes: AIP, ALK, APC*, ATM*, AXIN2, BAP1, BARD1, BLM, BMPR1A, BRCA1*, BRCA2*, BRIP1*, CDC73, CDH1*, CDK4, CDKN1B, CDKN2A, CHEK2*, CTNNA1, DICER1, FANCC, FH, FLCN, GALNT12, KIF1B, LZTR1, MAX, MEN1, MET, MLH1*, MSH2*, MSH3, MSH6*, MUTYH*, NBN, NF1*, NF2, NTHL1, PALB2*, PHOX2B, PMS2*, POT1, PRKAR1A, PTCH1, PTEN*, RAD51C*, RAD51D*, RB1, RECQL, RET, SDHA, SDHAF2, SDHB, SDHC, SDHD, SMAD4, SMARCA4, SMARCB1, SMARCE1, STK11, SUFU, TMEM127, TP53*, TSC1, TSC2, VHL and XRCC2  (sequencing and deletion/duplication); EGFR, EGLN1, HOXB13, KIT, MITF, PDGFRA, POLD1, and POLE (sequencing only); EPCAM and GREM1 (deletion/duplication only). DNA and RNA analyses performed for * genes.    09/03/2021 -  Chemotherapy   Patient is on Treatment Plan : COLORECTAL FOLFOX q14d x 3 months       FAMILY HISTORY:  We obtained a detailed, 4-generation family history.  Significant diagnoses are listed below: Family History  Adopted: Yes  Problem Relation Age of Onset   Cancer Sister        breast   Cancer Brother        liver      The patient has one daughter who is cancer free.  She is adopted, but lived down the street from her biological family.  She has two full siblings and five maternal half siblings.  One half sister had breast cancer.  Both biological parents are deceased.   There is no further information known about the extended family.   Kelsey Bridges is unaware of previous family history of genetic testing for hereditary cancer risks. Patient's maternal ancestors are of African American descent, and paternal ancestors are of African American descent. There is no reported Ashkenazi Jewish ancestry. There is no known consanguinity.  GENETIC TEST RESULTS: Genetic testing reported out on August 20, 2021 through the CancerNext-Expanded+RNAinsight cancer panel found no pathogenic mutations. The CancerNext-Expanded gene panel offered by [facility] and includes sequencing and rearrangement analysis for the following 77 genes: AIP, ALK, APC*, ATM*, AXIN2, BAP1, BARD1, BLM, BMPR1A, BRCA1*, BRCA2*, BRIP1*, CDC73, CDH1*, CDK4, CDKN1B, CDKN2A, CHEK2*, CTNNA1, DICER1, FANCC, FH, FLCN, GALNT12, KIF1B, LZTR1, MAX, MEN1, MET, MLH1*, MSH2*, MSH3, MSH6*, MUTYH*, NBN, NF1*, NF2, NTHL1, PALB2*, PHOX2B, PMS2*, POT1, PRKAR1A, PTCH1, PTEN*, RAD51C*, RAD51D*, RB1, RECQL, RET, SDHA, SDHAF2, SDHB, SDHC, SDHD, SMAD4, SMARCA4,  SMARCB1, SMARCE1, STK11, SUFU, TMEM127, TP53*, TSC1, TSC2, VHL and  XRCC2 (sequencing and deletion/duplication); EGFR, EGLN1, HOXB13, KIT, MITF, PDGFRA, POLD1, and POLE (sequencing only); EPCAM and GREM1 (deletion/duplication only). DNA and RNA analyses performed for * genes. The test report has been scanned into EPIC and is located under the Molecular Pathology section of the Results Review tab.  A portion of the result report is included below for reference.     We discussed with Kelsey Bridges that because current genetic testing is not perfect, it is possible there may be a gene mutation in one of these genes that current testing cannot detect, but that chance is small.  We also discussed, that there could be another gene that has not yet been discovered, or that we have not yet tested, that is responsible for the cancer diagnoses in the family. It is also possible there is a hereditary cause for the cancer in the family that Kelsey Bridges did not inherit and therefore was not identified in her testing.  Therefore, it is important to remain in touch with cancer genetics in the future so that we can continue to offer Kelsey Bridges the most up to date genetic testing.   ADDITIONAL GENETIC TESTING: We discussed with Kelsey Bridges that her genetic testing was fairly extensive.  If there are genes identified to increase cancer risk that can be analyzed in the future, we would be happy to discuss and coordinate this testing at that time.    CANCER SCREENING RECOMMENDATIONS: Kelsey Bridges test result is considered negative (normal).  This means that we have not identified a hereditary cause for her personal and family history of cancer at this time. Most cancers happen by chance and this negative test suggests that her cancer may fall into this category.    While reassuring, this does not definitively rule out a hereditary predisposition to cancer. It is still possible that there could be genetic mutations that are undetectable by current technology. There could be  genetic mutations in genes that have not been tested or identified to increase cancer risk.  Therefore, it is recommended she continue to follow the cancer management and screening guidelines provided by her oncology and primary healthcare provider.   An individual's cancer risk and medical management are not determined by genetic test results alone. Overall cancer risk assessment incorporates additional factors, including personal medical history, family history, and any available genetic information that may result in a personalized plan for cancer prevention and surveillance  RECOMMENDATIONS FOR FAMILY MEMBERS:  Individuals in this family might be at some increased risk of developing cancer, over the general population risk, simply due to the family history of cancer.  We recommended women in this family have a yearly mammogram beginning at age 5, or 75 years younger than the earliest onset of cancer, an annual clinical breast exam, and perform monthly breast self-exams. Women in this family should also have a gynecological exam as recommended by their primary provider. All family members should be referred for colonoscopy starting at age 49.  FOLLOW-UP: Lastly, we discussed with Kelsey Bridges that cancer genetics is a rapidly advancing field and it is possible that new genetic tests will be appropriate for her and/or her family members in the future. We encouraged her to remain in contact with cancer genetics on an annual basis so we can update her personal and family histories and let her know of advances in cancer genetics that may benefit this family.   Our contact  number was provided. Kelsey Bridges questions were answered to her satisfaction, and she knows she is welcome to call us at anytime with additional questions or concerns.   Roma Kayser, [place], [facility] Licensed, Certified Genetic Counselor [email or web address]

## 2021-08-25 ENCOUNTER — Other Ambulatory Visit: Payer: Self-pay | Admitting: Radiology

## 2021-08-25 ENCOUNTER — Telehealth: Payer: Self-pay | Admitting: Gastroenterology

## 2021-08-25 ENCOUNTER — Encounter: Payer: Self-pay | Admitting: Oncology

## 2021-08-25 NOTE — Telephone Encounter (Signed)
Inbound call from patient with questions if she should stop taking omeprazole

## 2021-08-26 ENCOUNTER — Other Ambulatory Visit: Payer: Self-pay

## 2021-08-26 ENCOUNTER — Encounter (HOSPITAL_COMMUNITY): Payer: Self-pay

## 2021-08-26 ENCOUNTER — Ambulatory Visit (HOSPITAL_COMMUNITY)
Admission: RE | Admit: 2021-08-26 | Discharge: 2021-08-26 | Disposition: A | Discharge status: Home / Self Care | Payer: Medicare HMO | Source: Ambulatory Visit | Attending: Oncology | Admitting: Oncology

## 2021-08-26 DIAGNOSIS — C186 Malignant neoplasm of descending colon: Secondary | ICD-10-CM | POA: Insufficient documentation

## 2021-08-26 HISTORY — PX: IR IMAGING GUIDED PORT INSERTION: IMG5740

## 2021-08-26 MED ORDER — FENTANYL CITRATE (PF) 100 MCG/2ML IJ SOLN
INTRAMUSCULAR | Status: AC | PRN
  Administered 2021-08-26 (×2): 25 ug via INTRAVENOUS

## 2021-08-26 MED ORDER — HEPARIN SOD (PORK) LOCK FLUSH 100 UNIT/ML IV SOLN
INTRAVENOUS | Status: AC
  Filled 2021-08-26: qty 5

## 2021-08-26 MED ORDER — LIDOCAINE-EPINEPHRINE 1 %-1:100000 IJ SOLN
INTRAMUSCULAR | Status: AC
  Filled 2021-08-26: qty 1

## 2021-08-26 MED ORDER — MIDAZOLAM HCL 2 MG/2ML IJ SOLN
INTRAMUSCULAR | Status: AC | PRN
  Administered 2021-08-26: 1 mg via INTRAVENOUS

## 2021-08-26 MED ORDER — MIDAZOLAM HCL 2 MG/2ML IJ SOLN
INTRAMUSCULAR | Status: AC
  Filled 2021-08-26: qty 2

## 2021-08-26 MED ORDER — FENTANYL CITRATE (PF) 100 MCG/2ML IJ SOLN
INTRAMUSCULAR | Status: AC
Start: 2021-08-26 — End: 2021-08-26
  Filled 2021-08-26: qty 2

## 2021-08-26 MED ORDER — LIDOCAINE-EPINEPHRINE 1 %-1:100000 IJ SOLN
INTRAMUSCULAR | Status: AC | PRN
  Administered 2021-08-26: 20 mL

## 2021-08-26 MED ORDER — SODIUM CHLORIDE 0.9 % IV SOLN: INTRAVENOUS | Status: DC

## 2021-08-26 MED ORDER — HEPARIN SOD (PORK) LOCK FLUSH 100 UNIT/ML IV SOLN
INTRAVENOUS | Status: AC | PRN
  Administered 2021-08-26: 500 [IU] via INTRAVENOUS

## 2021-08-26 NOTE — H&P (Signed)
Chief Complaint: Patient was seen in consultation today for [facility] a cath placement at the request of Sherrill,Gary B  Referring Physician(s): Betsy Coder B  Supervising Physician: Ruthann Cancer  Patient Status: [facility] - Out-pt  History of Present Illness: Kelsey Bridges is a 70 y.o. female   Newly dx colorectal cancer To start chemo soon Per Dr Benay Spice request-- ned PAC  Past Medical History:  Diagnosis Date   Abnormal Pap smear    Allergy    Atrial fibrillation ([place])    Dysrhythmia    Family history of breast cancer    Hammer toe    Hypertension     Past Surgical History:  Procedure Laterality Date   ABDOMINAL HYSTERECTOMY     FLEXIBLE SIGMOIDOSCOPY N/A 07/17/2021   Procedure: FLEXIBLE SIGMOIDOSCOPY;  Surgeon: Ileana Roup, MD;  Location: [facility] ORS;  Service: General;  Laterality: N/A;   HAMMER TOE SURGERY Right 2019   LAPAROSCOPIC SUPRACERVICAL HYSTERECTOMY     left ovary  10/12/1986   Varicose veins      Allergies: Penicillins  Medications: Prior to Admission medications   Medication Sig Start Date End Date Taking? Authorizing Provider  diltiazem (CARDIZEM CD) 240 MG 24 hr capsule Take 1 capsule (240 mg total) by mouth daily. 01/08/21  Yes Cleaver, Jossie Ng, NP  ELIQUIS 5 MG TABS tablet Take 1 tablet (5 mg total) by mouth 2 (two) times daily. 07/30/21  Yes Lorretta Harp, MD  ferrous sulfate 325 (65 FE) MG tablet Take 325 mg by mouth every morning.   Yes [provider]  losartan (COZAAR) 50 MG tablet Take 50 mg by mouth every morning. 05/24/21  Yes [provider]  meloxicam (MOBIC) 15 MG tablet Take 15 mg by mouth daily as needed (knee pain).   Yes [provider]  omeprazole (PRILOSEC) 20 MG capsule Take 1 capsule (20 mg total) by mouth 2 (two) times daily. Patient taking differently: Take 20 mg by mouth every morning. 04/07/21  Yes Jackquline Denmark, MD  sertraline (ZOLOFT) 100 MG tablet Take 100 mg by mouth daily as  needed (anxiety).   Yes [provider]  traMADol (ULTRAM) 50 MG tablet Take 50 mg by mouth every 6 (six) hours as needed for moderate pain.   Yes [provider]  COVID-19 mRNA bivalent vaccine, Pfizer, (PFIZER COVID-19 VAC BIVALENT) injection Inject into the muscle. Patient not taking: No sig reported 08/19/21   Carlyle Basques, MD  lidocaine-prilocaine (EMLA) cream Apply 1 application topically as needed. 08/19/21   Ladell Pier, MD  pantoprazole (PROTONIX) 40 MG tablet Take 40 mg by mouth daily.    [provider]  prochlorperazine (COMPAZINE) 10 MG tablet Take 1 tablet (10 mg total) by mouth every 6 (six) hours as needed for nausea or vomiting. 08/19/21   Ladell Pier, MD     Family History  Adopted: Yes  Problem Relation Age of Onset   Cancer Sister        breast   Cancer Brother        liver    Social History   Socioeconomic History   Marital status: Widowed    Spouse name: Not on file   Number of children: Not on file   Years of education: Not on file   Highest education level: Not on file  Occupational History   Not on file  Tobacco Use   Smoking status: Every Day    Packs/day: 0.20    Types: Cigarettes  Smokeless tobacco: Current    Types: Snuff  Vaping Use   Vaping Use: Never used  Substance and Sexual Activity   Alcohol use: Yes    Comment: "at the holidays maybe"   Drug use: No   Sexual activity: Not Currently    Birth control/protection: Surgical  Other Topics Concern   Not on file  Social History Narrative   Not on file   Social Determinants of Health   Financial Resource Strain: Not on file  Food Insecurity: Not on file  Transportation Needs: Not on file  Physical Activity: Not on file  Stress: Not on file  Social Connections: Not on file     Review of Systems: A 12 point ROS discussed and pertinent positives are indicated in the HPI above.  All other systems are negative.  Review of Systems  Constitutional:   Negative for activity change, fatigue and fever.  Respiratory:  Negative for cough and shortness of breath.   Gastrointestinal:  Negative for abdominal pain.  Psychiatric/Behavioral:  Negative for behavioral problems and confusion.    Vital Signs: BP 136/90   Pulse 72   Temp 98.1 F (36.7 C) (Oral)   Resp 18   Ht 5\' 1"  (1.549 m)   Wt 129 lb (58.5 kg)   SpO2 100%   BMI 24.37 kg/m   Physical Exam Vitals reviewed.  HENT:     Mouth/Throat:     Mouth: Mucous membranes are moist.  Cardiovascular:     Rate and Rhythm: Normal rate and regular rhythm.     Heart sounds: Normal heart sounds.  Pulmonary:     Breath sounds: Normal breath sounds.  Abdominal:     Palpations: Abdomen is soft.  Musculoskeletal:        General: Normal range of motion.  Skin:    General: Skin is warm.  Neurological:     Mental Status: She is alert and oriented to person, place, and time.  Psychiatric:        Behavior: Behavior normal.    Imaging: No results found.  Labs:  CBC: Recent Labs    07/20/21 0433 07/21/21 0433 07/22/21 0355 08/19/21 1026  WBC 9.0 6.7 7.9 5.1  HGB 9.6* 8.5* 8.9* 10.8*  HCT 30.6* 27.1* 28.3* 35.2*  PLT 322 298 347 272    COAGS: Recent Labs    07/07/21 1524  INR 1.4*    BMP: Recent Labs    07/19/21 0518 07/20/21 0433 07/21/21 0433 07/22/21 0355 08/19/21 1026  NA 136 136 139  --  138  K 3.7 4.0 4.1 4.5 3.9  CL 104 103 107  --  101  CO2 23 23 27   --  25  GLUCOSE 114* 166* 125*  --  121*  BUN 15 18 13   --  11  CALCIUM 8.5* 9.0 8.7*  --  9.6  CREATININE 1.20* 1.08* 0.98 0.88 1.00  GFRNONAA 48* 55* >60 >60 >60    LIVER FUNCTION TESTS: Recent Labs    04/03/21 1607 07/07/21 1524 08/19/21 1026  BILITOT 0.4 0.5 0.4  AST 15 12* 13*  ALT 11 9 7   ALKPHOS 88 66 66  PROT 6.7 7.6 7.4  ALBUMIN 3.7 3.6 4.3    TUMOR MARKERS: Recent Labs    04/03/21 1607 08/19/21 1026  CEA 87.6* 10.18*    Assessment and Plan:  New colorectal cancer To start  chemo soon For Port a cath today Risks and benefits of image guided port-a-catheter placement was discussed with the  patient including, but not limited to bleeding, infection, pneumothorax, or fibrin sheath development and need for additional procedures.  All of the patient's questions were answered, patient is agreeable to proceed. Consent signed and in chart.   Thank you for this interesting consult.  I greatly enjoyed meeting LADENA JACQUEZ and look forward to participating in their care.  A copy of this report was sent to the requesting provider on this date.  Electronically Signed: Lavonia Drafts, PA-C 08/26/2021, 9:04 AM   I spent a total of  30 Minutes   in face to face in clinical consultation, greater than 50% of which was counseling/coordinating care for [facility] placement

## 2021-08-26 NOTE — Procedures (Signed)
Interventional Radiology Procedure Note ° °Procedure: Single Lumen Power Port Placement   ° °Access:  Right internal jugular vein ° °Findings: Catheter tip positioned at cavoatrial junction. Port is ready for immediate use.  ° °Complications: None ° °EBL: < 10 mL ° °Recommendations:  °- Ok to shower in 24 hours °- Do not submerge for 7 days °- Routine line care  ° ° °Cylas Falzone, MD °Pager: [phone] ° ° ° °

## 2021-08-27 ENCOUNTER — Telehealth: Payer: Self-pay | Admitting: *Deleted

## 2021-08-27 NOTE — Telephone Encounter (Signed)
Phone kept ringing.

## 2021-08-27 NOTE — Telephone Encounter (Signed)
Phone kept ringing. Can you please advise on this Dr. Lyndel Safe?

## 2021-08-28 NOTE — Telephone Encounter (Signed)
Please send her a letter that she should continue taking omeprazole RG

## 2021-08-29 NOTE — Telephone Encounter (Signed)
Letter sent with number to call.

## 2021-08-31 ENCOUNTER — Other Ambulatory Visit: Payer: Self-pay | Admitting: Oncology

## 2021-09-02 ENCOUNTER — Other Ambulatory Visit: Payer: Self-pay

## 2021-09-02 DIAGNOSIS — C186 Malignant neoplasm of descending colon: Secondary | ICD-10-CM

## 2021-09-02 NOTE — Progress Notes (Signed)
Pharmacist Chemotherapy Monitoring - Initial Assessment    Anticipated start date: 09/03/21   The following has been reviewed per standard work regarding the patient's treatment regimen: The patient's diagnosis, treatment plan and drug doses, and organ/hematologic function Lab orders and baseline tests specific to treatment regimen  The treatment plan start date, drug sequencing, and pre-medications Prior authorization status  Patient's documented medication list, including drug-drug interaction screen and prescriptions for anti-emetics and supportive care specific to the treatment regimen The drug concentrations, fluid compatibility, administration routes, and timing of the medications to be used The patient's access for treatment and lifetime cumulative dose history, if applicable  The patient's medication allergies and previous infusion related reactions, if applicable   Changes made to treatment plan:  N/A  Follow up needed:  N/A   Kelsey Bridges, [place], 09/02/2021  10:53 AM

## 2021-09-03 ENCOUNTER — Inpatient Hospital Stay: Payer: Medicare HMO

## 2021-09-03 ENCOUNTER — Other Ambulatory Visit: Payer: Self-pay

## 2021-09-03 ENCOUNTER — Inpatient Hospital Stay (HOSPITAL_BASED_OUTPATIENT_CLINIC_OR_DEPARTMENT_OTHER): Payer: Medicare HMO | Admitting: Nurse Practitioner

## 2021-09-03 ENCOUNTER — Encounter: Payer: Self-pay | Admitting: Nurse Practitioner

## 2021-09-03 ENCOUNTER — Ambulatory Visit: Payer: Medicare HMO | Admitting: Nutrition

## 2021-09-03 VITALS — BP 125/79 | HR 79 | Temp 98.4°F | Resp 20 | Ht 61.0 in | Wt 117.0 lb

## 2021-09-03 VITALS — BP 121/69 | HR 71

## 2021-09-03 DIAGNOSIS — C186 Malignant neoplasm of descending colon: Secondary | ICD-10-CM

## 2021-09-03 DIAGNOSIS — Z5111 Encounter for antineoplastic chemotherapy: Secondary | ICD-10-CM | POA: Diagnosis not present

## 2021-09-03 LAB — CBC WITH DIFFERENTIAL (CANCER CENTER ONLY)
Abs Immature Granulocytes: 0.01 10*3/uL (ref 0.00–0.07)
Basophils Absolute: 0 10*3/uL (ref 0.0–0.1)
Basophils Relative: 1 %
Eosinophils Absolute: 0 10*3/uL (ref 0.0–0.5)
Eosinophils Relative: 1 %
HCT: 32.1 % — ABNORMAL LOW (ref 36.0–46.0)
Hemoglobin: 10 g/dL — ABNORMAL LOW (ref 12.0–15.0)
Immature Granulocytes: 0 %
Lymphocytes Relative: 25 %
Lymphs Abs: 1.6 10*3/uL (ref 0.7–4.0)
MCH: 25.4 pg — ABNORMAL LOW (ref 26.0–34.0)
MCHC: 31.2 g/dL (ref 30.0–36.0)
MCV: 81.5 fL (ref 80.0–100.0)
Monocytes Absolute: 0.4 10*3/uL (ref 0.1–1.0)
Monocytes Relative: 7 %
Neutro Abs: 4.3 10*3/uL (ref 1.7–7.7)
Neutrophils Relative %: 66 %
Platelet Count: 264 10*3/uL (ref 150–400)
RBC: 3.94 MIL/uL (ref 3.87–5.11)
RDW: 17.1 % — ABNORMAL HIGH (ref 11.5–15.5)
WBC Count: 6.4 10*3/uL (ref 4.0–10.5)
nRBC: 0 % (ref 0.0–0.2)

## 2021-09-03 LAB — CMP (CANCER CENTER ONLY)
ALT: 6 U/L (ref 0–44)
AST: 11 U/L — ABNORMAL LOW (ref 15–41)
Albumin: 4.2 g/dL (ref 3.5–5.0)
Alkaline Phosphatase: 65 U/L (ref 38–126)
Anion gap: 8 (ref 5–15)
BUN: 15 mg/dL (ref 8–23)
CO2: 24 mmol/L (ref 22–32)
Calcium: 10 mg/dL (ref 8.9–10.3)
Chloride: 103 mmol/L (ref 98–111)
Creatinine: 0.95 mg/dL (ref 0.44–1.00)
GFR, Estimated: >60 mL/min (ref >60)
Glucose, Bld: 108 mg/dL — ABNORMAL HIGH (ref 70–99)
Potassium: 4.2 mmol/L (ref 3.5–5.1)
Sodium: 135 mmol/L (ref 135–145)
Total Bilirubin: 0.5 mg/dL (ref 0.3–1.2)
Total Protein: 7 g/dL (ref 6.5–8.1)

## 2021-09-03 MED ORDER — OXALIPLATIN CHEMO INJECTION 100 MG/20ML
85.0000 mg/m2 | Freq: Once | INTRAVENOUS | Status: AC
  Administered 2021-09-03: 130 mg via INTRAVENOUS
  Filled 2021-09-03: qty 20

## 2021-09-03 MED ORDER — LEUCOVORIN CALCIUM INJECTION 350 MG
400.0000 mg/m2 | Freq: Once | INTRAVENOUS | Status: AC
  Administered 2021-09-03: 612 mg via INTRAVENOUS
  Filled 2021-09-03: qty 30.6

## 2021-09-03 MED ORDER — FLUOROURACIL CHEMO INJECTION 2.5 GM/50ML
400.0000 mg/m2 | Freq: Once | INTRAVENOUS | Status: AC
  Administered 2021-09-03: 600 mg via INTRAVENOUS
  Filled 2021-09-03: qty 12

## 2021-09-03 MED ORDER — SODIUM CHLORIDE 0.9 % IV SOLN
10.0000 mg | Freq: Once | INTRAVENOUS | Status: AC
  Administered 2021-09-03: 10 mg via INTRAVENOUS
  Filled 2021-09-03: qty 1

## 2021-09-03 MED ORDER — PALONOSETRON HCL INJECTION 0.25 MG/5ML
0.2500 mg | Freq: Once | INTRAVENOUS | Status: AC
  Administered 2021-09-03: 0.25 mg via INTRAVENOUS
  Filled 2021-09-03: qty 5

## 2021-09-03 MED ORDER — DEXTROSE 5 % IV SOLN: Freq: Once | INTRAVENOUS | Status: AC

## 2021-09-03 MED ORDER — SODIUM CHLORIDE 0.9 % IV SOLN
2400.0000 mg/m2 | INTRAVENOUS | Status: DC
  Administered 2021-09-03: 3650 mg via INTRAVENOUS
  Filled 2021-09-03: qty 73

## 2021-09-03 NOTE — Progress Notes (Signed)
  [place] OFFICE PROGRESS NOTE   Diagnosis:  Colon cancer  INTERVAL HISTORY:   Kelsey Bridges returns as scheduled. She is scheduled to begin treatment today with FOLFOX.  She denies nausea.  Bowels are moving.  She notes that she is eating less.  Objective:  Vital signs in last 24 hours:  Blood pressure 125/79, pulse 79, temperature 98.4 F (36.9 C), temperature source Oral, resp. rate 20, height _0  (1.549 m), weight 117 lb (53.1 kg), SpO2 100 %.    HEENT: No thrush or ulcers. Resp: Lungs clear bilaterally. Cardio: Regular rate and rhythm. GI: Abdomen soft and nontender.  No hepatomegaly.  Healed abdomen surgical incisions.  Vascular: No leg edema.  Port-A-Cath without erythema.  Lab Results:  Lab Results  Component Value Date   WBC 6.4 09/03/2021   HGB 10.0 (L) 09/03/2021   HCT 32.1 (L) 09/03/2021   MCV 81.5 09/03/2021   PLT 264 09/03/2021   NEUTROABS 4.3 09/03/2021    Imaging:  No results found.  Medications: I have reviewed the patient's current medications.  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 0/12 lymph nodes, 2 tumor deposits, no lymphovascular perineural invasion MSI-high, loss of MSH2 and MSH6 expression Colonoscopy 04/03/2021-partially obstructing descending colon mass, adenocarcinoma CTs 04/18/2021-descending colon mass, no evidence of metastatic disease, emphysema Elevated preoperative CEA, 04/03/2021 08/19/2021 repeat CEA 10 Cycle 1 FOLFOX 09/03/2021   Atrial fibrillation Tobacco use 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 pylori June 2022 Hypertension    Disposition: Kelsey Bridges appears stable.  She is scheduled to begin treatment with FOLFOX today.  We again reviewed potential toxicities.  She agrees to proceed.  CBC reviewed.  Counts adequate to proceed with treatment.  She has lost some weight and reports decreased intake.  The [place] dietitian will meet with  her today.  She will return for lab, follow-up, cycle 2 FOLFOX in 2 weeks.  We are available to see her sooner if needed.    Ned Card ANP/GNP-BC   09/03/2021  9:10 AM

## 2021-09-03 NOTE — Progress Notes (Signed)
70 year old female diagnosed with Colon cancer receiving first treatment of FOLFOX today. She is a patient of Dr. Benay Spice.  PMH includes A-fib, HTN, and COPD.  Medications include ferrous sulfate, Prilosec, and compazine.  Labs include Glucose 108.  Height: 5'1". Weight: 117 pounds. UBW: 142 pounds in Feb 2022. BMI: 22.11.  Patient reports early satiety. States she eats when she is hungry. Sometimes fixes Oatmeal or Grits for breakfast or eats an egg and toast. She is excited to eat [place] wings and greens for dinner tomorrow. She denies problems chewing or swallowing. Has tried Ensure plus and likes it. She was given 2 samples and still has one left. Denies nausea, vomiting, diarrhea and constipation.  Patient has lost 18% of her body weight over 9 months and 15% in less than 6 months which is significant/severe.  Nutrition Diagnosis: Severe Malnutrition related to cancer and associated treatments as evidenced by % wt loss and severe depletion of muscle and fat on physical exam.  Intervention: Educated to increase small amounts of food frequently throughout the day. Eat on a schedule rather than by appetite. Add Ensure Plus or equivalent BID between meals. Reviewed high calorie and high protein foods and provided a fact sheet. Gave samples and coupons for Ensure Plus. Questions answered and teach back method used. Contact information given.  Monitoring,Evaluation, Goals: Patient will increase calories and protein to promote repletion.  Next Visit: Wednesday, Dec 7, during infusion.

## 2021-09-03 NOTE — Progress Notes (Signed)
Patient presents for treatment. RN assessment completed along with the following:  Labs/vitals reviewed - Yes, and within treatment parameters.   Weight within 10% of previous measurement - Yes Oncology Treatment Attestation completed for current therapy- Yes, on date 08/19/21 Informed consent completed and reflects current therapy/intent - Yes, on date 09/03/21             Provider progress note reviewed - Yes, today's provider note was reviewed. Treatment/Antibody/Supportive plan reviewed - Yes, and there are no adjustments needed for today's treatment. S&H and other orders reviewed - Yes, and there are no additional orders identified. Previous treatment date reviewed - Yes, and the appropriate amount of time has elapsed between treatments. Clinic Hand Off Received from - Leander Rams, NP  Patient to proceed with treatment.

## 2021-09-03 NOTE — Patient Instructions (Addendum)
Kelsey Bridges   The chemotherapy medication bag should finish at 46 hours, 96 hours, or 7 days. For example, if your pump is scheduled for 46 hours and it was put on at 4:00 p.m., it should finish at 2:00 p.m. the day it is scheduled to come off regardless of your appointment time.     Estimated time to finish at 11:00 Friday, September 05, 2021.   If the display on your pump reads "Low Volume" and it is beeping, take the batteries out of the pump and come to the cancer center for it to be taken off.   If the pump alarms go off prior to the pump reading "Low Volume" then call [phone] and someone can assist you.  If the plunger comes out and the chemotherapy medication is leaking out, please use your home chemo spill kit to clean up the spill. Do NOT use paper towels or other household products.  If you have problems or questions regarding your pump, please call either [phone] (24 hours a day) or the cancer center Monday-Friday 8:00 a.m.- 4:30 p.m. at the clinic number and we will assist you. If you are unable to get assistance, then go to the nearest Emergency Department and ask the staff to contact the IV team for assistance.  Discharge Instructions: Thank you for choosing [facility] to provide your oncology and hematology care.   If you have a lab appointment with the [facility], please go directly to the [facility] and check in at the registration area.   Wear comfortable clothing and clothing appropriate for easy access to any Portacath or PICC line.   We strive to give you quality time with your provider. You may need to reschedule your appointment if you arrive late (15 or more minutes).  Arriving late affects you and other patients whose appointments are after yours.  Also, if you miss three or more appointments without notifying the office, you may be dismissed from the clinic at the provider's discretion.      For prescription  refill requests, have your pharmacy contact our office and allow 72 hours for refills to be completed.    Today you received the following chemotherapy and/or immunotherapy agents oxaliplatin, leucovorin, fluorouracil      To help prevent nausea and vomiting after your treatment, we encourage you to take your nausea medication as directed.  BELOW ARE SYMPTOMS THAT SHOULD BE REPORTED IMMEDIATELY: *FEVER GREATER THAN 100.4 F (38 C) OR HIGHER *CHILLS OR SWEATING *NAUSEA AND VOMITING THAT IS NOT CONTROLLED WITH YOUR NAUSEA MEDICATION *UNUSUAL SHORTNESS OF BREATH *UNUSUAL BRUISING OR BLEEDING *URINARY PROBLEMS (pain or burning when urinating, or frequent urination) *BOWEL PROBLEMS (unusual diarrhea, constipation, pain near the anus) TENDERNESS IN MOUTH AND THROAT WITH OR WITHOUT PRESENCE OF ULCERS (sore throat, sores in mouth, or a toothache) UNUSUAL RASH, SWELLING OR PAIN  UNUSUAL VAGINAL DISCHARGE OR ITCHING   Items with * indicate a potential emergency and should be followed up as soon as possible or go to the Emergency Department if any problems should occur.  Please show the CHEMOTHERAPY ALERT CARD or IMMUNOTHERAPY ALERT CARD at check-in to the Emergency Department and triage nurse.  Should you have questions after your visit or need to cancel or reschedule your appointment, please contact [facility]  Dept: [phone]  and follow the prompts.  Office hours are 8:00 a.m. to 4:30 p.m. Monday - Friday. Please note that voicemails  left after 4:00 p.m. may not be returned until the following business day.  We are closed weekends and major holidays. You have access to a nurse at all times for urgent questions. Please call the main number to the clinic Dept: [phone] and follow the prompts.   For any non-urgent questions, you may also contact your provider using MyChart. We now offer e-Visits for anyone 71 and older to request care online for non-urgent symptoms.  For details visit [email or web address].   Also download the MyChart app! Go to the app store, search "MyChart", open the app, select [facility], and log in with your MyChart username and password.  Due to Covid, a mask is required upon entering the hospital/clinic. If you do not have a mask, one will be given to you upon arrival. For doctor visits, patients may have 1 support person aged 22 or older with them. For treatment visits, patients cannot have anyone with them due to current Covid guidelines and our immunocompromised population.   Oxaliplatin Injection What is this medication? OXALIPLATIN (ox AL i PLA tin) is a chemotherapy drug. It targets fast dividing cells, like cancer cells, and causes these cells to die. This medicine is used to treat cancers of the colon and rectum, and many other cancers. This medicine may be used for other purposes; ask your health care provider or pharmacist if you have questions. COMMON BRAND NAME(S): Eloxatin What should I tell my care team before I take this medication? They need to know if you have any of these conditions: heart disease history of irregular heartbeat liver disease low blood counts, like white cells, platelets, or red blood cells lung or breathing disease, like asthma take medicines that treat or prevent blood clots tingling of the fingers or toes, or other nerve disorder an unusual or allergic reaction to oxaliplatin, other chemotherapy, other medicines, foods, dyes, or preservatives pregnant or trying to get pregnant breast-feeding How should I use this medication? This drug is given as an infusion into a vein. It is administered in a hospital or clinic by a specially trained health care professional. Talk to your pediatrician regarding the use of this medicine in children. Special care may be needed. Overdosage: If you think you have taken too much of this medicine contact a poison control center or emergency room at once. NOTE:  This medicine is only for you. Do not share this medicine with others. What if I miss a dose? It is important not to miss a dose. Call your doctor or health care professional if you are unable to keep an appointment. What may interact with this medication? Do not take this medicine with any of the following medications: cisapride dronedarone pimozide thioridazine This medicine may also interact with the following medications: aspirin and aspirin-like medicines certain medicines that treat or prevent blood clots like warfarin, apixaban, dabigatran, and rivaroxaban cisplatin cyclosporine diuretics medicines for infection like acyclovir, adefovir, amphotericin B, bacitracin, cidofovir, foscarnet, ganciclovir, gentamicin, pentamidine, vancomycin NSAIDs, medicines for pain and inflammation, like ibuprofen or naproxen other medicines that prolong the QT interval (an abnormal heart rhythm) pamidronate zoledronic acid This list may not describe all possible interactions. Give your health care provider a list of all the medicines, herbs, non-prescription drugs, or dietary supplements you use. Also tell them if you smoke, drink alcohol, or use illegal drugs. Some items may interact with your medicine. What should I watch for while using this medication? Your condition will be monitored carefully while you are receiving this  medicine. You may need blood work done while you are taking this medicine. This medicine may make you feel generally unwell. This is not uncommon as chemotherapy can affect healthy cells as well as cancer cells. Report any side effects. Continue your course of treatment even though you feel ill unless your healthcare professional tells you to stop. This medicine can make you more sensitive to cold. Do not drink cold drinks or use ice. Cover exposed skin before coming in contact with cold temperatures or cold objects. When out in cold weather wear warm clothing and cover your mouth  and nose to warm the air that goes into your lungs. Tell your doctor if you get sensitive to the cold. Do not become pregnant while taking this medicine or for 9 months after stopping it. Women should inform their health care professional if they wish to become pregnant or think they might be pregnant. Men should not father a child while taking this medicine and for 6 months after stopping it. There is potential for serious side effects to an unborn child. Talk to your health care professional for more information. Do not breast-feed a child while taking this medicine or for 3 months after stopping it. This medicine has caused ovarian failure in some women. This medicine may make it more difficult to get pregnant. Talk to your health care professional if you are concerned about your fertility. This medicine has caused decreased sperm counts in some men. This may make it more difficult to father a child. Talk to your health care professional if you are concerned about your fertility. This medicine may increase your risk of getting an infection. Call your health care professional for advice if you get a fever, chills, or sore throat, or other symptoms of a cold or flu. Do not treat yourself. Try to avoid being around people who are sick. Avoid taking medicines that contain aspirin, acetaminophen, ibuprofen, naproxen, or ketoprofen unless instructed by your health care professional. These medicines may hide a fever. Be careful brushing or flossing your teeth or using a toothpick because you may get an infection or bleed more easily. If you have any dental work done, tell your dentist you are receiving this medicine. What side effects may I notice from receiving this medication? Side effects that you should report to your doctor or health care professional as soon as possible: allergic reactions like skin rash, itching or hives, swelling of the face, lips, or tongue breathing problems cough low blood counts  - this medicine may decrease the number of white blood cells, red blood cells, and platelets. You may be at increased risk for infections and bleeding nausea, vomiting pain, redness, or irritation at site where injected pain, tingling, numbness in the hands or feet signs and symptoms of bleeding such as bloody or black, tarry stools; red or dark brown urine; spitting up blood or brown material that looks like coffee grounds; red spots on the skin; unusual bruising or bleeding from the eyes, gums, or nose signs and symptoms of a dangerous change in heartbeat or heart rhythm like chest pain; dizziness; fast, irregular heartbeat; palpitations; feeling faint or lightheaded; falls signs and symptoms of infection like fever; chills; cough; sore throat; pain or trouble passing urine signs and symptoms of liver injury like dark yellow or brown urine; general ill feeling or flu-like symptoms; light-colored stools; loss of appetite; nausea; right upper belly pain; unusually weak or tired; yellowing of the eyes or skin signs and symptoms of  low red blood cells or anemia such as unusually weak or tired; feeling faint or lightheaded; falls signs and symptoms of muscle injury like dark urine; trouble passing urine or change in the amount of urine; unusually weak or tired; muscle pain; back pain Side effects that usually do not require medical attention (report to your doctor or health care professional if they continue or are bothersome): changes in taste diarrhea gas hair loss loss of appetite mouth sores This list may not describe all possible side effects. Call your doctor for medical advice about side effects. You may report side effects to FDA at 1-800-FDA-1088. Where should I keep my medication? This drug is given in a hospital or clinic and will not be stored at home. NOTE: This sheet is a summary. It may not cover all possible information. If you have questions about this medicine, talk to your doctor,  pharmacist, or health care provider.  2022 Elsevier/Gold Standard (2021-06-17 00:00:00)  Leucovorin injection What is this medication? LEUCOVORIN (loo koe VOR in) is used to prevent or treat the harmful effects of some medicines. This medicine is used to treat anemia caused by a low amount of folic acid in the body. It is also used with 5-fluorouracil (5-FU) to treat colon cancer. This medicine may be used for other purposes; ask your health care provider or pharmacist if you have questions. What should I tell my care team before I take this medication? They need to know if you have any of these conditions: anemia from low levels of vitamin B-12 in the blood an unusual or allergic reaction to leucovorin, folic acid, other medicines, foods, dyes, or preservatives pregnant or trying to get pregnant breast-feeding How should I use this medication? This medicine is for injection into a muscle or into a vein. It is given by a health care professional in a hospital or clinic setting. Talk to your pediatrician regarding the use of this medicine in children. Special care may be needed. Overdosage: If you think you have taken too much of this medicine contact a poison control center or emergency room at once. NOTE: This medicine is only for you. Do not share this medicine with others. What if I miss a dose? This does not apply. What may interact with this medication? capecitabine fluorouracil phenobarbital phenytoin primidone trimethoprim-sulfamethoxazole This list may not describe all possible interactions. Give your health care provider a list of all the medicines, herbs, non-prescription drugs, or dietary supplements you use. Also tell them if you smoke, drink alcohol, or use illegal drugs. Some items may interact with your medicine. What should I watch for while using this medication? Your condition will be monitored carefully while you are receiving this medicine. This medicine may  increase the side effects of 5-fluorouracil, 5-FU. Tell your doctor or health care professional if you have diarrhea or mouth sores that do not get better or that get worse. What side effects may I notice from receiving this medication? Side effects that you should report to your doctor or health care professional as soon as possible: allergic reactions like skin rash, itching or hives, swelling of the face, lips, or tongue breathing problems fever, infection mouth sores unusual bleeding or bruising unusually weak or tired Side effects that usually do not require medical attention (report to your doctor or health care professional if they continue or are bothersome): constipation or diarrhea loss of appetite nausea, vomiting This list may not describe all possible side effects. Call your doctor for medical  advice about side effects. You may report side effects to FDA at 1-800-FDA-1088. Where should I keep my medication? This drug is given in a hospital or clinic and will not be stored at home. NOTE: This sheet is a summary. It may not cover all possible information. If you have questions about this medicine, talk to your doctor, pharmacist, or health care provider.  2022 Elsevier/Gold Standard (2008-04-05 00:00:00)  Fluorouracil, 5-FU injection What is this medication? FLUOROURACIL, 5-FU (flure oh YOOR a sil) is a chemotherapy drug. It slows the growth of cancer cells. This medicine is used to treat many types of cancer like breast cancer, colon or rectal cancer, pancreatic cancer, and stomach cancer. This medicine may be used for other purposes; ask your health care provider or pharmacist if you have questions. COMMON BRAND NAME(S): Adrucil What should I tell my care team before I take this medication? They need to know if you have any of these conditions: blood disorders dihydropyrimidine dehydrogenase (DPD) deficiency infection (especially a virus infection such as chickenpox, cold  sores, or herpes) kidney disease liver disease malnourished, poor nutrition recent or ongoing radiation therapy an unusual or allergic reaction to fluorouracil, other chemotherapy, other medicines, foods, dyes, or preservatives pregnant or trying to get pregnant breast-feeding How should I use this medication? This drug is given as an infusion or injection into a vein. It is administered in a hospital or clinic by a specially trained health care professional. Talk to your pediatrician regarding the use of this medicine in children. Special care may be needed. Overdosage: If you think you have taken too much of this medicine contact a poison control center or emergency room at once. NOTE: This medicine is only for you. Do not share this medicine with others. What if I miss a dose? It is important not to miss your dose. Call your doctor or health care professional if you are unable to keep an appointment. What may interact with this medication? Do not take this medicine with any of the following medications: live virus vaccines This medicine may also interact with the following medications: medicines that treat or prevent blood clots like warfarin, enoxaparin, and dalteparin This list may not describe all possible interactions. Give your health care provider a list of all the medicines, herbs, non-prescription drugs, or dietary supplements you use. Also tell them if you smoke, drink alcohol, or use illegal drugs. Some items may interact with your medicine. What should I watch for while using this medication? Visit your doctor for checks on your progress. This drug may make you feel generally unwell. This is not uncommon, as chemotherapy can affect healthy cells as well as cancer cells. Report any side effects. Continue your course of treatment even though you feel ill unless your doctor tells you to stop. In some cases, you may be given additional medicines to help with side effects. Follow all  directions for their use. Call your doctor or health care professional for advice if you get a fever, chills or sore throat, or other symptoms of a cold or flu. Do not treat yourself. This drug decreases your body's ability to fight infections. Try to avoid being around people who are sick. This medicine may increase your risk to bruise or bleed. Call your doctor or health care professional if you notice any unusual bleeding. Be careful brushing and flossing your teeth or using a toothpick because you may get an infection or bleed more easily. If you have any dental work done,  tell your dentist you are receiving this medicine. Avoid taking products that contain aspirin, acetaminophen, ibuprofen, naproxen, or ketoprofen unless instructed by your doctor. These medicines may hide a fever. Do not become pregnant while taking this medicine. Women should inform their doctor if they wish to become pregnant or think they might be pregnant. There is a potential for serious side effects to an unborn child. Talk to your health care professional or pharmacist for more information. Do not breast-feed an infant while taking this medicine. Men should inform their doctor if they wish to father a child. This medicine may lower sperm counts. Do not treat diarrhea with over the counter products. Contact your doctor if you have diarrhea that lasts more than 2 days or if it is severe and watery. This medicine can make you more sensitive to the sun. Keep out of the sun. If you cannot avoid being in the sun, wear protective clothing and use sunscreen. Do not use sun lamps or tanning beds/booths. What side effects may I notice from receiving this medication? Side effects that you should report to your doctor or health care professional as soon as possible: allergic reactions like skin rash, itching or hives, swelling of the face, lips, or tongue low blood counts - this medicine may decrease the number of white blood cells, red  blood cells and platelets. You may be at increased risk for infections and bleeding. signs of infection - fever or chills, cough, sore throat, pain or difficulty passing urine signs of decreased platelets or bleeding - bruising, pinpoint red spots on the skin, black, tarry stools, blood in the urine signs of decreased red blood cells - unusually weak or tired, fainting spells, lightheadedness breathing problems changes in vision chest pain mouth sores nausea and vomiting pain, swelling, redness at site where injected pain, tingling, numbness in the hands or feet redness, swelling, or sores on hands or feet stomach pain unusual bleeding Side effects that usually do not require medical attention (report to your doctor or health care professional if they continue or are bothersome): changes in finger or toe nails diarrhea dry or itchy skin hair loss headache loss of appetite sensitivity of eyes to the light stomach upset unusually teary eyes This list may not describe all possible side effects. Call your doctor for medical advice about side effects. You may report side effects to FDA at 1-800-FDA-1088. Where should I keep my medication? This drug is given in a hospital or clinic and will not be stored at home. NOTE: This sheet is a summary. It may not cover all possible information. If you have questions about this medicine, talk to your doctor, pharmacist, or health care provider.  2022 Elsevier/Gold Standard (2021-06-17 00:00:00)

## 2021-09-05 ENCOUNTER — Inpatient Hospital Stay: Payer: Medicare HMO

## 2021-09-08 ENCOUNTER — Telehealth: Payer: Self-pay | Admitting: Emergency Medicine

## 2021-09-08 NOTE — Telephone Encounter (Signed)
Unable to reach pt. No voice mail setup to leave message

## 2021-09-08 NOTE — Telephone Encounter (Signed)
24 Hour Callback No answer, no voicemail

## 2021-09-08 NOTE — Telephone Encounter (Signed)
-----   Message from Jackquline Denmark, MD sent at 09/08/2021  8:47 AM EST ----- Regarding: FW: Duplicated PPI Therapy Remo Lipps, He can do any PPIs-whichever works the best Omeprazole or Protonix But not both RG  ----- Message ----- From: Pricilla Larsson, [facility] Sent: 09/03/2021   1:02 PM EST To: Jackquline Denmark, MD, [facility], CMA Subject: Duplicated PPI Therapy                         FYI - this patient called to question whether or not to continue omeprazole (prescribed by Dr. Lyndel Safe) because she also has another prescription by a different provider for pantoprazole.  One is being filled locally by [facility] and the other by a mail order pharmacy.

## 2021-09-08 NOTE — Telephone Encounter (Signed)
-----   Message from Jackquline Denmark, MD sent at 09/08/2021  8:47 AM EST ----- Regarding: FW: Duplicated PPI Therapy Remo Lipps, He can do any PPIs-whichever works the best Omeprazole or Protonix But not both RG  ----- Message ----- From: Pricilla Larsson, [facility] Sent: 09/03/2021   1:02 PM EST To: Jackquline Denmark, MD, [facility], CMA Subject: Duplicated PPI Therapy                         FYI - this patient called to question whether or not to continue omeprazole (prescribed by Dr. Lyndel Safe) because she also has another prescription by a different provider for pantoprazole.  One is being filled locally by [facility] and the other by a mail order pharmacy.

## 2021-09-09 NOTE — Telephone Encounter (Signed)
-----   Message from Jackquline Denmark, MD sent at 09/08/2021  8:47 AM EST ----- Regarding: FW: Duplicated PPI Therapy Remo Lipps, He can do any PPIs-whichever works the best Omeprazole or Protonix But not both RG  ----- Message ----- From: Pricilla Larsson, [facility] Sent: 09/03/2021   1:02 PM EST To: Jackquline Denmark, MD, [facility] Her[facility], CMA Subject: Duplicated PPI Therapy                         FYI - this patient called to question whether or not to continue omeprazole (prescribed by Dr. Lyndel Safe) because she also has another prescription by a different provider for pantoprazole.  One is being filled locally by [facility] and the other by a mail order pharmacy.

## 2021-09-09 NOTE — Telephone Encounter (Signed)
Pt notified of Dr. Lyndel Safe recommendations. Pt stated that she was only taking ONE and NOT both. Pt verbalized understanding with all questions answered.

## 2021-09-09 NOTE — Telephone Encounter (Signed)
-----   Message from Jackquline Denmark, MD sent at 09/08/2021  8:47 AM EST ----- Regarding: FW: Duplicated PPI Therapy Remo Lipps, He can do any PPIs-whichever works the best Omeprazole or Protonix But not both RG  ----- Message ----- From: Pricilla Larsson, [facility] Sent: 09/03/2021   1:02 PM EST To: Jackquline Denmark, MD, [place], CMA Subject: Duplicated PPI Therapy                         FYI - this patient called to question whether or not to continue omeprazole (prescribed by Dr. Lyndel Safe) because she also has another prescription by a different provider for pantoprazole.  One is being filled locally by [facility] and the other by a mail order pharmacy.

## 2021-09-12 NOTE — Telephone Encounter (Signed)
Mobile number kept ringing LVM on home  Patient needs to take Protonix and not omeprazole if possible per Dr Lyndel Safe.   Letter sent as well

## 2021-09-14 ENCOUNTER — Other Ambulatory Visit: Payer: Self-pay | Admitting: Oncology

## 2021-09-17 ENCOUNTER — Other Ambulatory Visit: Payer: Self-pay

## 2021-09-17 ENCOUNTER — Inpatient Hospital Stay: Payer: Medicare HMO

## 2021-09-17 ENCOUNTER — Inpatient Hospital Stay: Payer: Medicare HMO | Attending: Oncology

## 2021-09-17 ENCOUNTER — Other Ambulatory Visit: Payer: Self-pay | Admitting: *Deleted

## 2021-09-17 ENCOUNTER — Encounter: Payer: Self-pay | Admitting: *Deleted

## 2021-09-17 ENCOUNTER — Inpatient Hospital Stay (HOSPITAL_BASED_OUTPATIENT_CLINIC_OR_DEPARTMENT_OTHER): Payer: Medicare HMO | Admitting: Nurse Practitioner

## 2021-09-17 ENCOUNTER — Inpatient Hospital Stay: Payer: Medicare HMO | Admitting: Nutrition

## 2021-09-17 ENCOUNTER — Encounter: Payer: Self-pay | Admitting: Nurse Practitioner

## 2021-09-17 VITALS — BP 158/92 | HR 84 | Temp 98.3°F | Resp 18

## 2021-09-17 VITALS — BP 138/92 | HR 99 | Temp 98.1°F | Resp 20 | Ht 61.0 in | Wt 120.4 lb

## 2021-09-17 DIAGNOSIS — Z79899 Other long term (current) drug therapy: Secondary | ICD-10-CM | POA: Diagnosis not present

## 2021-09-17 DIAGNOSIS — Z5111 Encounter for antineoplastic chemotherapy: Secondary | ICD-10-CM | POA: Diagnosis not present

## 2021-09-17 DIAGNOSIS — F17210 Nicotine dependence, cigarettes, uncomplicated: Secondary | ICD-10-CM | POA: Insufficient documentation

## 2021-09-17 DIAGNOSIS — C186 Malignant neoplasm of descending colon: Secondary | ICD-10-CM

## 2021-09-17 DIAGNOSIS — J449 Chronic obstructive pulmonary disease, unspecified: Secondary | ICD-10-CM | POA: Diagnosis not present

## 2021-09-17 DIAGNOSIS — I10 Essential (primary) hypertension: Secondary | ICD-10-CM | POA: Diagnosis not present

## 2021-09-17 DIAGNOSIS — I4891 Unspecified atrial fibrillation: Secondary | ICD-10-CM | POA: Diagnosis not present

## 2021-09-17 LAB — CBC WITH DIFFERENTIAL (CANCER CENTER ONLY)
Abs Immature Granulocytes: 0.01 10*3/uL (ref 0.00–0.07)
Basophils Absolute: 0 10*3/uL (ref 0.0–0.1)
Basophils Relative: 1 %
Eosinophils Absolute: 0 10*3/uL (ref 0.0–0.5)
Eosinophils Relative: 0 %
HCT: 30.1 % — ABNORMAL LOW (ref 36.0–46.0)
Hemoglobin: 9.6 g/dL — ABNORMAL LOW (ref 12.0–15.0)
Immature Granulocytes: 0 %
Lymphocytes Relative: 24 %
Lymphs Abs: 1 10*3/uL (ref 0.7–4.0)
MCH: 25.8 pg — ABNORMAL LOW (ref 26.0–34.0)
MCHC: 31.9 g/dL (ref 30.0–36.0)
MCV: 80.9 fL (ref 80.0–100.0)
Monocytes Absolute: 0.4 10*3/uL (ref 0.1–1.0)
Monocytes Relative: 10 %
Neutro Abs: 2.8 10*3/uL (ref 1.7–7.7)
Neutrophils Relative %: 65 %
Platelet Count: 206 10*3/uL (ref 150–400)
RBC: 3.72 MIL/uL — ABNORMAL LOW (ref 3.87–5.11)
RDW: 17.8 % — ABNORMAL HIGH (ref 11.5–15.5)
WBC Count: 4.3 10*3/uL (ref 4.0–10.5)
nRBC: 0 % (ref 0.0–0.2)

## 2021-09-17 LAB — CMP (CANCER CENTER ONLY)
ALT: 7 U/L (ref 0–44)
AST: 12 U/L — ABNORMAL LOW (ref 15–41)
Albumin: 4 g/dL (ref 3.5–5.0)
Alkaline Phosphatase: 65 U/L (ref 38–126)
Anion gap: 10 (ref 5–15)
BUN: 13 mg/dL (ref 8–23)
CO2: 22 mmol/L (ref 22–32)
Calcium: 9.7 mg/dL (ref 8.9–10.3)
Chloride: 107 mmol/L (ref 98–111)
Creatinine: 0.89 mg/dL (ref 0.44–1.00)
GFR, Estimated: >60 mL/min (ref >60)
Glucose, Bld: 117 mg/dL — ABNORMAL HIGH (ref 70–99)
Potassium: 3.5 mmol/L (ref 3.5–5.1)
Sodium: 139 mmol/L (ref 135–145)
Total Bilirubin: 0.4 mg/dL (ref 0.3–1.2)
Total Protein: 6.5 g/dL (ref 6.5–8.1)

## 2021-09-17 LAB — CEA (ACCESS): CEA (CHCC): 3.28 ng/mL (ref 0.00–5.00)

## 2021-09-17 MED ORDER — FLUOROURACIL CHEMO INJECTION 2.5 GM/50ML
400.0000 mg/m2 | Freq: Once | INTRAVENOUS | Status: AC
  Administered 2021-09-17: 600 mg via INTRAVENOUS
  Filled 2021-09-17: qty 12

## 2021-09-17 MED ORDER — SODIUM CHLORIDE 0.9 % IV SOLN
2400.0000 mg/m2 | INTRAVENOUS | Status: DC
  Administered 2021-09-17: 3650 mg via INTRAVENOUS
  Filled 2021-09-17: qty 73

## 2021-09-17 MED ORDER — OXALIPLATIN CHEMO INJECTION 100 MG/20ML
85.0000 mg/m2 | Freq: Once | INTRAVENOUS | Status: AC
  Administered 2021-09-17: 130 mg via INTRAVENOUS
  Filled 2021-09-17: qty 10

## 2021-09-17 MED ORDER — MAGIC MOUTHWASH: 5.0000 mL | Freq: Four times a day (QID) | ORAL | 1 refills | Status: DC | PRN

## 2021-09-17 MED ORDER — PALONOSETRON HCL INJECTION 0.25 MG/5ML
0.2500 mg | Freq: Once | INTRAVENOUS | Status: AC
  Administered 2021-09-17: 0.25 mg via INTRAVENOUS
  Filled 2021-09-17: qty 5

## 2021-09-17 MED ORDER — SODIUM CHLORIDE 0.9 % IV SOLN
10.0000 mg | Freq: Once | INTRAVENOUS | Status: AC
  Administered 2021-09-17: 10 mg via INTRAVENOUS
  Filled 2021-09-17: qty 1

## 2021-09-17 MED ORDER — LEUCOVORIN CALCIUM INJECTION 350 MG
400.0000 mg/m2 | Freq: Once | INTRAVENOUS | Status: AC
  Administered 2021-09-17: 612 mg via INTRAVENOUS
  Filled 2021-09-17: qty 30.6

## 2021-09-17 MED ORDER — DEXTROSE 5 % IV SOLN: Freq: Once | INTRAVENOUS | Status: AC

## 2021-09-17 NOTE — Progress Notes (Signed)
Patient presents for treatment. RN assessment completed along with the following:  Labs/vitals reviewed - Yes, and within treatment parameters.   Weight within 10% of previous measurement - Yes Oncology Treatment Attestation completed for current therapy- Yes, on date 08/19/21 Informed consent completed and reflects current therapy/intent - Yes, on date 09/03/21             Provider progress note reviewed - Yes, today's provider note was reviewed. Treatment/Antibody/Supportive plan reviewed - Yes, and there are no adjustments needed for today's treatment. S&H and other orders reviewed - Yes, and there are no additional orders identified. Previous treatment date reviewed - Yes, and the appropriate amount of time has elapsed between treatments. Clinic Hand Off Received from - Ned Card, NP  Patient to proceed with treatment.

## 2021-09-17 NOTE — Patient Instructions (Signed)

## 2021-09-17 NOTE — Patient Instructions (Signed)
[place]   The chemotherapy medication bag should finish at 46 hours, 96 hours, or 7 days. For example, if your pump is scheduled for 46 hours and it was put on at 4:00 p.m., it should finish at 2:00 p.m. the day it is scheduled to come off regardless of your appointment time.     Estimated time to finish at 11:00 Friday, December 9th, 2022.   If the display on your pump reads "Low Volume" and it is beeping, take the batteries out of the pump and come to the cancer center for it to be taken off.   If the pump alarms go off prior to the pump reading "Low Volume" then call [phone] and someone can assist you.  If the plunger comes out and the chemotherapy medication is leaking out, please use your home chemo spill kit to clean up the spill. Do NOT use paper towels or other household products.  If you have problems or questions regarding your pump, please call either [phone] (24 hours a day) or the cancer center Monday-Friday 8:00 a.m.- 4:30 p.m. at the clinic number and we will assist you. If you are unable to get assistance, then go to the nearest Emergency Department and ask the staff to contact the IV team for assistance.  Discharge Instructions: Thank you for choosing [facility] to provide your oncology and hematology care.   If you have a lab appointment with the [facility], please go directly to the [facility] and check in at the registration area.   Wear comfortable clothing and clothing appropriate for easy access to any Portacath or PICC line.   We strive to give you quality time with your provider. You may need to reschedule your appointment if you arrive late (15 or more minutes).  Arriving late affects you and other patients whose appointments are after yours.  Also, if you miss three or more appointments without notifying the office, you may be dismissed from the clinic at the provider's discretion.      For prescription  refill requests, have your pharmacy contact our office and allow 72 hours for refills to be completed.    Today you received the following chemotherapy and/or immunotherapy agents oxaliplatin, leucovorin, fluorouracil      To help prevent nausea and vomiting after your treatment, we encourage you to take your nausea medication as directed.  BELOW ARE SYMPTOMS THAT SHOULD BE REPORTED IMMEDIATELY: *FEVER GREATER THAN 100.4 F (38 C) OR HIGHER *CHILLS OR SWEATING *NAUSEA AND VOMITING THAT IS NOT CONTROLLED WITH YOUR NAUSEA MEDICATION *UNUSUAL SHORTNESS OF BREATH *UNUSUAL BRUISING OR BLEEDING *URINARY PROBLEMS (pain or burning when urinating, or frequent urination) *BOWEL PROBLEMS (unusual diarrhea, constipation, pain near the anus) TENDERNESS IN MOUTH AND THROAT WITH OR WITHOUT PRESENCE OF ULCERS (sore throat, sores in mouth, or a toothache) UNUSUAL RASH, SWELLING OR PAIN  UNUSUAL VAGINAL DISCHARGE OR ITCHING   Items with * indicate a potential emergency and should be followed up as soon as possible or go to the Emergency Department if any problems should occur.  Please show the CHEMOTHERAPY ALERT CARD or IMMUNOTHERAPY ALERT CARD at check-in to the Emergency Department and triage nurse.  Should you have questions after your visit or need to cancel or reschedule your appointment, please contact [facility]  Dept: [phone]  and follow the prompts.  Office hours are 8:00 a.m. to 4:30 p.m. Monday - Friday. Please note that voicemails  left after 4:00 p.m. may not be returned until the following business day.  We are closed weekends and major holidays. You have access to a nurse at all times for urgent questions. Please call the main number to the clinic Dept: [phone] and follow the prompts.   For any non-urgent questions, you may also contact your provider using MyChart. We now offer e-Visits for anyone 73 and older to request care online for non-urgent symptoms.  For details visit [email or web address].   Also download the MyChart app! Go to the app store, search "MyChart", open the app, select [facility], and log in with your MyChart username and password.  Due to Covid, a mask is required upon entering the hospital/clinic. If you do not have a mask, one will be given to you upon arrival. For doctor visits, patients may have 1 support person aged 22 or older with them. For treatment visits, patients cannot have anyone with them due to current Covid guidelines and our immunocompromised population.   Oxaliplatin Injection What is this medication? OXALIPLATIN (ox AL i PLA tin) is a chemotherapy drug. It targets fast dividing cells, like cancer cells, and causes these cells to die. This medicine is used to treat cancers of the colon and rectum, and many other cancers. This medicine may be used for other purposes; ask your health care provider or pharmacist if you have questions. COMMON BRAND NAME(S): Eloxatin What should I tell my care team before I take this medication? They need to know if you have any of these conditions: heart disease history of irregular heartbeat liver disease low blood counts, like white cells, platelets, or red blood cells lung or breathing disease, like asthma take medicines that treat or prevent blood clots tingling of the fingers or toes, or other nerve disorder an unusual or allergic reaction to oxaliplatin, other chemotherapy, other medicines, foods, dyes, or preservatives pregnant or trying to get pregnant breast-feeding How should I use this medication? This drug is given as an infusion into a vein. It is administered in a hospital or clinic by a specially trained health care professional. Talk to your pediatrician regarding the use of this medicine in children. Special care may be needed. Overdosage: If you think you have taken too much of this medicine contact a poison control center or emergency room at once. NOTE:  This medicine is only for you. Do not share this medicine with others. What if I miss a dose? It is important not to miss a dose. Call your doctor or health care professional if you are unable to keep an appointment. What may interact with this medication? Do not take this medicine with any of the following medications: cisapride dronedarone pimozide thioridazine This medicine may also interact with the following medications: aspirin and aspirin-like medicines certain medicines that treat or prevent blood clots like warfarin, apixaban, dabigatran, and rivaroxaban cisplatin cyclosporine diuretics medicines for infection like acyclovir, adefovir, amphotericin B, bacitracin, cidofovir, foscarnet, ganciclovir, gentamicin, pentamidine, vancomycin NSAIDs, medicines for pain and inflammation, like ibuprofen or naproxen other medicines that prolong the QT interval (an abnormal heart rhythm) pamidronate zoledronic acid This list may not describe all possible interactions. Give your health care provider a list of all the medicines, herbs, non-prescription drugs, or dietary supplements you use. Also tell them if you smoke, drink alcohol, or use illegal drugs. Some items may interact with your medicine. What should I watch for while using this medication? Your condition will be monitored carefully while you are receiving this  medicine. You may need blood work done while you are taking this medicine. This medicine may make you feel generally unwell. This is not uncommon as chemotherapy can affect healthy cells as well as cancer cells. Report any side effects. Continue your course of treatment even though you feel ill unless your healthcare professional tells you to stop. This medicine can make you more sensitive to cold. Do not drink cold drinks or use ice. Cover exposed skin before coming in contact with cold temperatures or cold objects. When out in cold weather wear warm clothing and cover your mouth  and nose to warm the air that goes into your lungs. Tell your doctor if you get sensitive to the cold. Do not become pregnant while taking this medicine or for 9 months after stopping it. Women should inform their health care professional if they wish to become pregnant or think they might be pregnant. Men should not father a child while taking this medicine and for 6 months after stopping it. There is potential for serious side effects to an unborn child. Talk to your health care professional for more information. Do not breast-feed a child while taking this medicine or for 3 months after stopping it. This medicine has caused ovarian failure in some women. This medicine may make it more difficult to get pregnant. Talk to your health care professional if you are concerned about your fertility. This medicine has caused decreased sperm counts in some men. This may make it more difficult to father a child. Talk to your health care professional if you are concerned about your fertility. This medicine may increase your risk of getting an infection. Call your health care professional for advice if you get a fever, chills, or sore throat, or other symptoms of a cold or flu. Do not treat yourself. Try to avoid being around people who are sick. Avoid taking medicines that contain aspirin, acetaminophen, ibuprofen, naproxen, or ketoprofen unless instructed by your health care professional. These medicines may hide a fever. Be careful brushing or flossing your teeth or using a toothpick because you may get an infection or bleed more easily. If you have any dental work done, tell your dentist you are receiving this medicine. What side effects may I notice from receiving this medication? Side effects that you should report to your doctor or health care professional as soon as possible: allergic reactions like skin rash, itching or hives, swelling of the face, lips, or tongue breathing problems cough low blood counts  - this medicine may decrease the number of white blood cells, red blood cells, and platelets. You may be at increased risk for infections and bleeding nausea, vomiting pain, redness, or irritation at site where injected pain, tingling, numbness in the hands or feet signs and symptoms of bleeding such as bloody or black, tarry stools; red or dark brown urine; spitting up blood or brown material that looks like coffee grounds; red spots on the skin; unusual bruising or bleeding from the eyes, gums, or nose signs and symptoms of a dangerous change in heartbeat or heart rhythm like chest pain; dizziness; fast, irregular heartbeat; palpitations; feeling faint or lightheaded; falls signs and symptoms of infection like fever; chills; cough; sore throat; pain or trouble passing urine signs and symptoms of liver injury like dark yellow or brown urine; general ill feeling or flu-like symptoms; light-colored stools; loss of appetite; nausea; right upper belly pain; unusually weak or tired; yellowing of the eyes or skin signs and symptoms of  low red blood cells or anemia such as unusually weak or tired; feeling faint or lightheaded; falls signs and symptoms of muscle injury like dark urine; trouble passing urine or change in the amount of urine; unusually weak or tired; muscle pain; back pain Side effects that usually do not require medical attention (report to your doctor or health care professional if they continue or are bothersome): changes in taste diarrhea gas hair loss loss of appetite mouth sores This list may not describe all possible side effects. Call your doctor for medical advice about side effects. You may report side effects to FDA at 1-800-FDA-1088. Where should I keep my medication? This drug is given in a hospital or clinic and will not be stored at home. NOTE: This sheet is a summary. It may not cover all possible information. If you have questions about this medicine, talk to your doctor,  pharmacist, or health care provider.  2022 Elsevier/Gold Standard (2021-06-17 00:00:00)  Leucovorin injection What is this medication? LEUCOVORIN (loo koe VOR in) is used to prevent or treat the harmful effects of some medicines. This medicine is used to treat anemia caused by a low amount of folic acid in the body. It is also used with 5-fluorouracil (5-FU) to treat colon cancer. This medicine may be used for other purposes; ask your health care provider or pharmacist if you have questions. What should I tell my care team before I take this medication? They need to know if you have any of these conditions: anemia from low levels of vitamin B-12 in the blood an unusual or allergic reaction to leucovorin, folic acid, other medicines, foods, dyes, or preservatives pregnant or trying to get pregnant breast-feeding How should I use this medication? This medicine is for injection into a muscle or into a vein. It is given by a health care professional in a hospital or clinic setting. Talk to your pediatrician regarding the use of this medicine in children. Special care may be needed. Overdosage: If you think you have taken too much of this medicine contact a poison control center or emergency room at once. NOTE: This medicine is only for you. Do not share this medicine with others. What if I miss a dose? This does not apply. What may interact with this medication? capecitabine fluorouracil phenobarbital phenytoin primidone trimethoprim-sulfamethoxazole This list may not describe all possible interactions. Give your health care provider a list of all the medicines, herbs, non-prescription drugs, or dietary supplements you use. Also tell them if you smoke, drink alcohol, or use illegal drugs. Some items may interact with your medicine. What should I watch for while using this medication? Your condition will be monitored carefully while you are receiving this medicine. This medicine may  increase the side effects of 5-fluorouracil, 5-FU. Tell your doctor or health care professional if you have diarrhea or mouth sores that do not get better or that get worse. What side effects may I notice from receiving this medication? Side effects that you should report to your doctor or health care professional as soon as possible: allergic reactions like skin rash, itching or hives, swelling of the face, lips, or tongue breathing problems fever, infection mouth sores unusual bleeding or bruising unusually weak or tired Side effects that usually do not require medical attention (report to your doctor or health care professional if they continue or are bothersome): constipation or diarrhea loss of appetite nausea, vomiting This list may not describe all possible side effects. Call your doctor for medical  advice about side effects. You may report side effects to FDA at 1-800-FDA-1088. Where should I keep my medication? This drug is given in a hospital or clinic and will not be stored at home. NOTE: This sheet is a summary. It may not cover all possible information. If you have questions about this medicine, talk to your doctor, pharmacist, or health care provider.  2022 Elsevier/Gold Standard (2008-04-05 00:00:00)  Fluorouracil, 5-FU injection What is this medication? FLUOROURACIL, 5-FU (flure oh YOOR a sil) is a chemotherapy drug. It slows the growth of cancer cells. This medicine is used to treat many types of cancer like breast cancer, colon or rectal cancer, pancreatic cancer, and stomach cancer. This medicine may be used for other purposes; ask your health care provider or pharmacist if you have questions. COMMON BRAND NAME(S): Adrucil What should I tell my care team before I take this medication? They need to know if you have any of these conditions: blood disorders dihydropyrimidine dehydrogenase (DPD) deficiency infection (especially a virus infection such as chickenpox, cold  sores, or herpes) kidney disease liver disease malnourished, poor nutrition recent or ongoing radiation therapy an unusual or allergic reaction to fluorouracil, other chemotherapy, other medicines, foods, dyes, or preservatives pregnant or trying to get pregnant breast-feeding How should I use this medication? This drug is given as an infusion or injection into a vein. It is administered in a hospital or clinic by a specially trained health care professional. Talk to your pediatrician regarding the use of this medicine in children. Special care may be needed. Overdosage: If you think you have taken too much of this medicine contact a poison control center or emergency room at once. NOTE: This medicine is only for you. Do not share this medicine with others. What if I miss a dose? It is important not to miss your dose. Call your doctor or health care professional if you are unable to keep an appointment. What may interact with this medication? Do not take this medicine with any of the following medications: live virus vaccines This medicine may also interact with the following medications: medicines that treat or prevent blood clots like warfarin, enoxaparin, and dalteparin This list may not describe all possible interactions. Give your health care provider a list of all the medicines, herbs, non-prescription drugs, or dietary supplements you use. Also tell them if you smoke, drink alcohol, or use illegal drugs. Some items may interact with your medicine. What should I watch for while using this medication? Visit your doctor for checks on your progress. This drug may make you feel generally unwell. This is not uncommon, as chemotherapy can affect healthy cells as well as cancer cells. Report any side effects. Continue your course of treatment even though you feel ill unless your doctor tells you to stop. In some cases, you may be given additional medicines to help with side effects. Follow all  directions for their use. Call your doctor or health care professional for advice if you get a fever, chills or sore throat, or other symptoms of a cold or flu. Do not treat yourself. This drug decreases your body's ability to fight infections. Try to avoid being around people who are sick. This medicine may increase your risk to bruise or bleed. Call your doctor or health care professional if you notice any unusual bleeding. Be careful brushing and flossing your teeth or using a toothpick because you may get an infection or bleed more easily. If you have any dental work done,  tell your dentist you are receiving this medicine. Avoid taking products that contain aspirin, acetaminophen, ibuprofen, naproxen, or ketoprofen unless instructed by your doctor. These medicines may hide a fever. Do not become pregnant while taking this medicine. Women should inform their doctor if they wish to become pregnant or think they might be pregnant. There is a potential for serious side effects to an unborn child. Talk to your health care professional or pharmacist for more information. Do not breast-feed an infant while taking this medicine. Men should inform their doctor if they wish to father a child. This medicine may lower sperm counts. Do not treat diarrhea with over the counter products. Contact your doctor if you have diarrhea that lasts more than 2 days or if it is severe and watery. This medicine can make you more sensitive to the sun. Keep out of the sun. If you cannot avoid being in the sun, wear protective clothing and use sunscreen. Do not use sun lamps or tanning beds/booths. What side effects may I notice from receiving this medication? Side effects that you should report to your doctor or health care professional as soon as possible: allergic reactions like skin rash, itching or hives, swelling of the face, lips, or tongue low blood counts - this medicine may decrease the number of white blood cells, red  blood cells and platelets. You may be at increased risk for infections and bleeding. signs of infection - fever or chills, cough, sore throat, pain or difficulty passing urine signs of decreased platelets or bleeding - bruising, pinpoint red spots on the skin, black, tarry stools, blood in the urine signs of decreased red blood cells - unusually weak or tired, fainting spells, lightheadedness breathing problems changes in vision chest pain mouth sores nausea and vomiting pain, swelling, redness at site where injected pain, tingling, numbness in the hands or feet redness, swelling, or sores on hands or feet stomach pain unusual bleeding Side effects that usually do not require medical attention (report to your doctor or health care professional if they continue or are bothersome): changes in finger or toe nails diarrhea dry or itchy skin hair loss headache loss of appetite sensitivity of eyes to the light stomach upset unusually teary eyes This list may not describe all possible side effects. Call your doctor for medical advice about side effects. You may report side effects to FDA at 1-800-FDA-1088. Where should I keep my medication? This drug is given in a hospital or clinic and will not be stored at home. NOTE: This sheet is a summary. It may not cover all possible information. If you have questions about this medicine, talk to your doctor, pharmacist, or health care provider.  2022 Elsevier/Gold Standard (2021-06-17 00:00:00)

## 2021-09-17 NOTE — Progress Notes (Signed)
Nutrition follow up completed with patient during infusion for Colon cancer.  Weight improved and documented as 120.4 pounds from 117 pounds Nov 23. Labs reviewed.  Patient reports good appetite and states she is eating 3 meals every day and snacking in between.  She also drinks 2 Ensure Plus every day. She has been cooking some of her favorite foods. Denies nausea, vomiting, constipation or diarrhea.  Nutrition Diagnosis: Severe Malnutrition ongoing  Intervention: Continue frequent small meals and snacks. Continues Ensure Plus or equivalent BID. Provided additional samples. Provided support and encouragement.  Monitoring, Evaluation, Goals: Increase calories and protein for repletion  Next Visit: Wednesday, Dec 21, during infusion.  **Disclaimer: This note was dictated with voice recognition software. Similar sounding words can inadvertently be transcribed and this note may contain transcription errors which may not have been corrected upon publication of note.**

## 2021-09-17 NOTE — Progress Notes (Signed)
  [facility] OFFICE PROGRESS NOTE   Diagnosis: Colon cancer  INTERVAL HISTORY:   Kelsey Bridges returns as scheduled.  She completed cycle 1 FOLFOX 09/03/2021.  She denies nausea/vomiting.  No diarrhea.  Gums felt "raw".  No ulcers.  She was able to eat and drink without difficulty.  No significant cold sensitivity.  No numbness or tingling in the hands or feet today.  She continues to have right knee pain.  Objective:  Vital signs in last 24 hours:  Blood pressure (!) 140/99, pulse 99, temperature 98.1 F (36.7 C), temperature source Oral, resp. rate 20, height 5' 1" (1.549 m), weight 120 lb 6.4 oz (54.6 kg), SpO2 100 %.    HEENT: No thrush or ulcers. Resp: Lungs clear bilaterally. Cardio: Irregular.   GI: Abdomen soft and nontender.  No hepatomegaly.  Healed abdomen surgical incisions. Vascular: No leg edema. Skin: Palms without erythema. Port-A-Cath without erythema.  Lab Results:  Lab Results  Component Value Date   WBC 4.3 09/17/2021   HGB 9.6 (L) 09/17/2021   HCT 30.1 (L) 09/17/2021   MCV 80.9 09/17/2021   PLT 206 09/17/2021   NEUTROABS 2.8 09/17/2021    Imaging:  No results found.  Medications: I have reviewed the patient's current medications.  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 0/12 lymph nodes, 2 tumor deposits, no lymphovascular perineural invasion MSI-high, loss of MSH2 and MSH6 expression Seen by genetics, testing completed, no pathogenic mutations (see note 08/22/2021) Colonoscopy 04/03/2021-partially obstructing descending colon mass, adenocarcinoma CTs 04/18/2021-descending colon mass, no evidence of metastatic disease, emphysema Elevated preoperative CEA, 04/03/2021 08/19/2021 repeat CEA 10 Cycle 1 FOLFOX 09/03/2021 Cycle 2 FOLFOX 09/17/2021   Atrial fibrillation Tobacco use 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 pylori June 2022 Hypertension  Disposition: Kelsey Bridges that  appears stable.  She tolerated cycle 1 FOLFOX well.  Plan to proceed with cycle 2 today as scheduled.  CBC from today reviewed.  Counts adequate to proceed with treatment.  She will return for lab, follow-up, cycle 3 FOLFOX in 2 weeks.  We are available to see her sooner if needed.    Ned Card ANP/GNP-BC   09/17/2021  8:53 AM

## 2021-09-17 NOTE — Progress Notes (Signed)
Patient seen by Lisa Thomas NP today  Vitals are within treatment parameters.  Labs reviewed by Lisa Thomas NP and are within treatment parameters.  Per physician team, patient is ready for treatment and there are NO modifications to the treatment plan.     

## 2021-09-17 NOTE — Progress Notes (Signed)
F/U with patient here for 2nd cycle FOLFOX, tolerated first cycle well, just reported some fatigue. Will continue to follow as needed and provide supportive presence

## 2021-09-19 ENCOUNTER — Inpatient Hospital Stay: Payer: Medicare HMO

## 2021-09-19 ENCOUNTER — Other Ambulatory Visit: Payer: Self-pay

## 2021-09-19 VITALS — BP 157/98 | HR 80 | Temp 98.0°F | Resp 18

## 2021-09-19 DIAGNOSIS — Z5111 Encounter for antineoplastic chemotherapy: Secondary | ICD-10-CM | POA: Diagnosis not present

## 2021-09-19 DIAGNOSIS — C186 Malignant neoplasm of descending colon: Secondary | ICD-10-CM

## 2021-09-19 MED ORDER — SODIUM CHLORIDE 0.9% FLUSH
10.0000 mL | INTRAVENOUS | Status: DC | PRN
  Administered 2021-09-19: 10 mL

## 2021-09-19 MED ORDER — HEPARIN SOD (PORK) LOCK FLUSH 100 UNIT/ML IV SOLN
500.0000 [IU] | Freq: Once | INTRAVENOUS | Status: AC | PRN
  Administered 2021-09-19: 500 [IU]

## 2021-09-19 NOTE — Patient Instructions (Signed)

## 2021-09-23 ENCOUNTER — Encounter: Payer: Self-pay | Admitting: *Deleted

## 2021-09-23 NOTE — Progress Notes (Signed)
Spoke with patient to coordinate care with transportation and F/U after 2nd treatment. Tolerated chemo well just having lingering fatigue, "today is the first day I have felt like doing anything", encouraged her to get out a little today but to make sure she is wearing mask. Stated that she was going to grocery with grandson.

## 2021-09-28 ENCOUNTER — Other Ambulatory Visit: Payer: Self-pay | Admitting: Oncology

## 2021-10-01 ENCOUNTER — Inpatient Hospital Stay: Payer: Medicare HMO

## 2021-10-01 ENCOUNTER — Inpatient Hospital Stay: Payer: Medicare HMO | Admitting: Nutrition

## 2021-10-01 ENCOUNTER — Other Ambulatory Visit (HOSPITAL_COMMUNITY): Payer: Self-pay

## 2021-10-01 ENCOUNTER — Encounter: Payer: Self-pay | Admitting: Nurse Practitioner

## 2021-10-01 ENCOUNTER — Other Ambulatory Visit: Payer: Self-pay | Admitting: Nurse Practitioner

## 2021-10-01 ENCOUNTER — Inpatient Hospital Stay (HOSPITAL_BASED_OUTPATIENT_CLINIC_OR_DEPARTMENT_OTHER): Payer: Medicare HMO | Admitting: Nurse Practitioner

## 2021-10-01 ENCOUNTER — Other Ambulatory Visit: Payer: Self-pay

## 2021-10-01 ENCOUNTER — Encounter: Payer: Self-pay | Admitting: Oncology

## 2021-10-01 VITALS — BP 166/93 | HR 78 | Temp 98.1°F | Resp 18

## 2021-10-01 VITALS — BP 150/90 | HR 82 | Temp 98.1°F | Resp 18 | Ht 61.0 in | Wt 117.8 lb

## 2021-10-01 DIAGNOSIS — C186 Malignant neoplasm of descending colon: Secondary | ICD-10-CM

## 2021-10-01 DIAGNOSIS — Z5111 Encounter for antineoplastic chemotherapy: Secondary | ICD-10-CM | POA: Diagnosis not present

## 2021-10-01 LAB — CBC WITH DIFFERENTIAL (CANCER CENTER ONLY)
Abs Immature Granulocytes: 0.01 10*3/uL (ref 0.00–0.07)
Basophils Absolute: 0 10*3/uL (ref 0.0–0.1)
Basophils Relative: 1 %
Eosinophils Absolute: 0 10*3/uL (ref 0.0–0.5)
Eosinophils Relative: 0 %
HCT: 30.7 % — ABNORMAL LOW (ref 36.0–46.0)
Hemoglobin: 9.8 g/dL — ABNORMAL LOW (ref 12.0–15.0)
Immature Granulocytes: 0 %
Lymphocytes Relative: 24 %
Lymphs Abs: 1.3 10*3/uL (ref 0.7–4.0)
MCH: 26.1 pg (ref 26.0–34.0)
MCHC: 31.9 g/dL (ref 30.0–36.0)
MCV: 81.9 fL (ref 80.0–100.0)
Monocytes Absolute: 0.7 10*3/uL (ref 0.1–1.0)
Monocytes Relative: 13 %
Neutro Abs: 3.4 10*3/uL (ref 1.7–7.7)
Neutrophils Relative %: 62 %
Platelet Count: 179 10*3/uL (ref 150–400)
RBC: 3.75 MIL/uL — ABNORMAL LOW (ref 3.87–5.11)
RDW: 19.2 % — ABNORMAL HIGH (ref 11.5–15.5)
WBC Count: 5.5 10*3/uL (ref 4.0–10.5)
nRBC: 0 % (ref 0.0–0.2)

## 2021-10-01 LAB — CMP (CANCER CENTER ONLY)
ALT: 8 U/L (ref 0–44)
AST: 14 U/L — ABNORMAL LOW (ref 15–41)
Albumin: 3.9 g/dL (ref 3.5–5.0)
Alkaline Phosphatase: 61 U/L (ref 38–126)
Anion gap: 9 (ref 5–15)
BUN: 14 mg/dL (ref 8–23)
CO2: 25 mmol/L (ref 22–32)
Calcium: 9.3 mg/dL (ref 8.9–10.3)
Chloride: 107 mmol/L (ref 98–111)
Creatinine: 0.82 mg/dL (ref 0.44–1.00)
GFR, Estimated: >60 mL/min (ref >60)
Glucose, Bld: 105 mg/dL — ABNORMAL HIGH (ref 70–99)
Potassium: 3.2 mmol/L — ABNORMAL LOW (ref 3.5–5.1)
Sodium: 141 mmol/L (ref 135–145)
Total Bilirubin: 0.6 mg/dL (ref 0.3–1.2)
Total Protein: 6.5 g/dL (ref 6.5–8.1)

## 2021-10-01 LAB — FERRITIN: Ferritin: 66 ng/mL (ref 11–307)

## 2021-10-01 MED ORDER — NYSTATIN 100000 UNIT/ML MT SUSP
OROMUCOSAL | 0 refills | Status: DC
  Filled 2021-10-01: qty 240, 6d supply, fill #0

## 2021-10-01 MED ORDER — OXALIPLATIN CHEMO INJECTION 100 MG/20ML
85.0000 mg/m2 | Freq: Once | INTRAVENOUS | Status: AC
  Administered 2021-10-01: 12:00:00 130 mg via INTRAVENOUS
  Filled 2021-10-01: qty 20

## 2021-10-01 MED ORDER — SODIUM CHLORIDE 0.9 % IV SOLN
2400.0000 mg/m2 | INTRAVENOUS | Status: DC
  Administered 2021-10-01: 14:00:00 3650 mg via INTRAVENOUS
  Filled 2021-10-01: qty 73

## 2021-10-01 MED ORDER — DEXTROSE 5 % IV SOLN: Freq: Once | INTRAVENOUS | Status: AC

## 2021-10-01 MED ORDER — SODIUM CHLORIDE 0.9 % IV SOLN
10.0000 mg | Freq: Once | INTRAVENOUS | Status: AC
  Administered 2021-10-01: 11:00:00 10 mg via INTRAVENOUS
  Filled 2021-10-01: qty 1

## 2021-10-01 MED ORDER — PALONOSETRON HCL INJECTION 0.25 MG/5ML
0.2500 mg | Freq: Once | INTRAVENOUS | Status: AC
  Administered 2021-10-01: 11:00:00 0.25 mg via INTRAVENOUS
  Filled 2021-10-01: qty 5

## 2021-10-01 MED ORDER — LEUCOVORIN CALCIUM INJECTION 350 MG
400.0000 mg/m2 | Freq: Once | INTRAVENOUS | Status: AC
  Administered 2021-10-01: 12:00:00 612 mg via INTRAVENOUS
  Filled 2021-10-01: qty 30.6

## 2021-10-01 MED ORDER — POTASSIUM CHLORIDE CRYS ER 20 MEQ PO TBCR: 20.0000 meq | EXTENDED_RELEASE_TABLET | Freq: Every day | ORAL | 1 refills | Status: DC

## 2021-10-01 MED ORDER — LIDOCAINE-PRILOCAINE 2.5-2.5 % EX CREA: TOPICAL_CREAM | CUTANEOUS | 0 refills | Status: DC

## 2021-10-01 MED ORDER — FLUOROURACIL CHEMO INJECTION 2.5 GM/50ML
400.0000 mg/m2 | Freq: Once | INTRAVENOUS | Status: AC
  Administered 2021-10-01: 14:00:00 600 mg via INTRAVENOUS
  Filled 2021-10-01: qty 12

## 2021-10-01 NOTE — Addendum Note (Signed)
Addended by: Owens Shark on: 10/01/2021 10:44 AM   Modules accepted: Orders

## 2021-10-01 NOTE — Progress Notes (Signed)
[facility] OFFICE PROGRESS NOTE   Diagnosis: Colon cancer  INTERVAL HISTORY:   Kelsey Bridges returns as scheduled.  She completed cycle 2 FOLFOX 09/17/2021.  No nausea after the chemotherapy.  She has mild nausea this morning.  No mouth sores.  No diarrhea.  She felt "tired and weak" for a few days after the most recent chemotherapy.  She had mild cold sensitivity for about 4 days.  No persistent neuropathy symptoms.  Objective:  Vital signs in last 24 hours:  Blood pressure (!) 150/90, pulse 82, temperature 98.1 F (36.7 C), temperature source Oral, resp. rate 18, height _0  (1.549 m), weight 117 lb 12.8 oz (53.4 kg), SpO2 100 %.    HEENT: Mild white coating of her tongue.  No buccal thrush. Resp: Lungs clear bilaterally. Cardio: Irregular. GI: Abdomen soft and nontender.  No hepatomegaly. Vascular: No leg edema. Neuro: Vibratory sense intact over the fingertips per tuning fork exam. Skin: Palms without erythema. Port-A-Cath without erythema.   Lab Results:  Lab Results  Component Value Date   WBC 5.5 10/01/2021   HGB 9.8 (L) 10/01/2021   HCT 30.7 (L) 10/01/2021   MCV 81.9 10/01/2021   PLT 179 10/01/2021   NEUTROABS 3.4 10/01/2021    Imaging:  No results found.  Medications: I have reviewed the patient's current medications.  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 0/12 lymph nodes, 2 tumor deposits, no lymphovascular perineural invasion MSI-high, loss of MSH2 and MSH6 expression Seen by genetics, testing completed, no pathogenic mutations (see note 08/22/2021) Colonoscopy 04/03/2021-partially obstructing descending colon mass, adenocarcinoma CTs 04/18/2021-descending colon mass, no evidence of metastatic disease, emphysema Elevated preoperative CEA, 04/03/2021 08/19/2021 repeat CEA 10 Cycle 1 FOLFOX 09/03/2021 Cycle 2 FOLFOX 09/17/2021 09/17/2021 repeat CEA 3.28 Cycle 3 FOLFOX  10/01/2021   Atrial fibrillation Tobacco use 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 pylori June 2022 Hypertension    Disposition: Kelsey Bridges appears stable.  She has completed 2 cycles of FOLFOX.  Overall she seems to be tolerating well.  Plan to proceed with cycle 3 today as scheduled.  CBC reviewed.  Counts adequate to proceed with treatment.  She will return for lab, follow-up, cycle 4 FOLFOX in 2 weeks.  She will contact the office in the interim with any problems.    Ned Card ANP/GNP-BC   10/01/2021  9:54 AM

## 2021-10-01 NOTE — Progress Notes (Signed)
Patient presents for treatment. RN assessment completed along with the following:  Labs/vitals reviewed - Yes, and per Ned Card, NP ok to treat with K 3.2.     Weight within 10% of previous measurement - Yes Oncology Treatment Attestation completed for current therapy- Yes, on date 08/19/21 Informed consent completed and reflects current therapy/intent - Yes, on date 09/03/21             Provider progress note reviewed - Yes, today's provider note was reviewed. Treatment/Antibody/Supportive plan reviewed - Yes, and there are no adjustments needed for today's treatment. S&H and other orders reviewed - Yes, and there are no additional orders identified. Previous treatment date reviewed - Yes, and the appropriate amount of time has elapsed between treatments. Clinic Hand Off Received from - Ned Card, NP  Patient to proceed with treatment.

## 2021-10-01 NOTE — Progress Notes (Signed)
Brief nutrition follow up completed with patient during infusion for colon cancer.  Wt documented as 117.8 pounds today, decreased from 120.4 pounds Dec 7. Patient expressed frustration that she lost weight. She reports a good appetite and denies nutrition impact symptoms.   Nutrition Diagnosis of Severe Malnutrition stable.  Intervention: Provided support and encouragement for continued small frequent high calorie, high protein foods. Continue Ensure Plus or equivalent BID.  Monitoring, evaluation, Goals: Weight and adequate oral intake.  Next Visit:To be scheduled as needed.

## 2021-10-01 NOTE — Progress Notes (Signed)
Patient seen by Lisa Thomas NP today  Vitals are within treatment parameters.  Labs reviewed by Lisa Thomas NP and are within treatment parameters.  Per physician team, patient is ready for treatment and there are NO modifications to the treatment plan.     

## 2021-10-01 NOTE — Patient Instructions (Addendum)
[place]   The chemotherapy medication bag should finish at 46 hours, 96 hours, or 7 days. For example, if your pump is scheduled for 46 hours and it was put on at 4:00 p.m., it should finish at 2:00 p.m. the day it is scheduled to come off regardless of your appointment time.     Estimated time to finish at 11:45am Friday, December 23rd, 2022.   If the display on your pump reads "Low Volume" and it is beeping, take the batteries out of the pump and come to the cancer center for it to be taken off.   If the pump alarms go off prior to the pump reading "Low Volume" then call [phone] and someone can assist you.  If the plunger comes out and the chemotherapy medication is leaking out, please use your home chemo spill kit to clean up the spill. Do NOT use paper towels or other household products.  If you have problems or questions regarding your pump, please call either [phone] (24 hours a day) or the cancer center Monday-Friday 8:00 a.m.- 4:30 p.m. at the clinic number and we will assist you. If you are unable to get assistance, then go to the nearest Emergency Department and ask the staff to contact the IV team for assistance.  Discharge Instructions: Thank you for choosing [facility] to provide your oncology and hematology care.   If you have a lab appointment with the [facility], please go directly to the [facility] and check in at the registration area.   Wear comfortable clothing and clothing appropriate for easy access to any Portacath or PICC line.   We strive to give you quality time with your provider. You may need to reschedule your appointment if you arrive late (15 or more minutes).  Arriving late affects you and other patients whose appointments are after yours.  Also, if you miss three or more appointments without notifying the office, you may be dismissed from the clinic at the providers discretion.      For  prescription refill requests, have your pharmacy contact our office and allow 72 hours for refills to be completed.    Today you received the following chemotherapy and/or immunotherapy agents oxaliplatin, leucovorin, fluorouracil      To help prevent nausea and vomiting after your treatment, we encourage you to take your nausea medication as directed.  BELOW ARE SYMPTOMS THAT SHOULD BE REPORTED IMMEDIATELY: *FEVER GREATER THAN 100.4 F (38 C) OR HIGHER *CHILLS OR SWEATING *NAUSEA AND VOMITING THAT IS NOT CONTROLLED WITH YOUR NAUSEA MEDICATION *UNUSUAL SHORTNESS OF BREATH *UNUSUAL BRUISING OR BLEEDING *URINARY PROBLEMS (pain or burning when urinating, or frequent urination) *BOWEL PROBLEMS (unusual diarrhea, constipation, pain near the anus) TENDERNESS IN MOUTH AND THROAT WITH OR WITHOUT PRESENCE OF ULCERS (sore throat, sores in mouth, or a toothache) UNUSUAL RASH, SWELLING OR PAIN  UNUSUAL VAGINAL DISCHARGE OR ITCHING   Items with * indicate a potential emergency and should be followed up as soon as possible or go to the Emergency Department if any problems should occur.  Please show the CHEMOTHERAPY ALERT CARD or IMMUNOTHERAPY ALERT CARD at check-in to the Emergency Department and triage nurse.  Should you have questions after your visit or need to cancel or reschedule your appointment, please contact [facility]  Dept: [phone]  and follow the prompts.  Office hours are 8:00 a.m. to 4:30 p.m. Monday - Friday. Please note that voicemails  left after 4:00 p.m. may not be returned until the following business day.  We are closed weekends and major holidays. You have access to a nurse at all times for urgent questions. Please call the main number to the clinic Dept: [phone] and follow the prompts.   For any non-urgent questions, you may also contact your provider using MyChart. We now offer e-Visits for anyone 37 and older to request care online for  non-urgent symptoms. For details visit [email or web address].   Also download the MyChart app! Go to the app store, search "MyChart", open the app, select [facility], and log in with your MyChart username and password.  Due to Covid, a mask is required upon entering the hospital/clinic. If you do not have a mask, one will be given to you upon arrival. For doctor visits, patients may have 1 support person aged 66 or older with them. For treatment visits, patients cannot have anyone with them due to current Covid guidelines and our immunocompromised population.   Oxaliplatin Injection What is this medication? OXALIPLATIN (ox AL i PLA tin) is a chemotherapy drug. It targets fast dividing cells, like cancer cells, and causes these cells to die. This medicine is used to treat cancers of the colon and rectum, and many other cancers. This medicine may be used for other purposes; ask your health care provider or pharmacist if you have questions. COMMON BRAND NAME(S): Eloxatin What should I tell my care team before I take this medication? They need to know if you have any of these conditions: heart disease history of irregular heartbeat liver disease low blood counts, like white cells, platelets, or red blood cells lung or breathing disease, like asthma take medicines that treat or prevent blood clots tingling of the fingers or toes, or other nerve disorder an unusual or allergic reaction to oxaliplatin, other chemotherapy, other medicines, foods, dyes, or preservatives pregnant or trying to get pregnant breast-feeding How should I use this medication? This drug is given as an infusion into a vein. It is administered in a hospital or clinic by a specially trained health care professional. Talk to your pediatrician regarding the use of this medicine in children. Special care may be needed. Overdosage: If you think you have taken too much of this medicine contact a poison control center or emergency  room at once. NOTE: This medicine is only for you. Do not share this medicine with others. What if I miss a dose? It is important not to miss a dose. Call your doctor or health care professional if you are unable to keep an appointment. What may interact with this medication? Do not take this medicine with any of the following medications: cisapride dronedarone pimozide thioridazine This medicine may also interact with the following medications: aspirin and aspirin-like medicines certain medicines that treat or prevent blood clots like warfarin, apixaban, dabigatran, and rivaroxaban cisplatin cyclosporine diuretics medicines for infection like acyclovir, adefovir, amphotericin B, bacitracin, cidofovir, foscarnet, ganciclovir, gentamicin, pentamidine, vancomycin NSAIDs, medicines for pain and inflammation, like ibuprofen or naproxen other medicines that prolong the QT interval (an abnormal heart rhythm) pamidronate zoledronic acid This list may not describe all possible interactions. Give your health care provider a list of all the medicines, herbs, non-prescription drugs, or dietary supplements you use. Also tell them if you smoke, drink alcohol, or use illegal drugs. Some items may interact with your medicine. What should I watch for while using this medication? Your condition will be monitored carefully while you are receiving this  medicine. You may need blood work done while you are taking this medicine. This medicine may make you feel generally unwell. This is not uncommon as chemotherapy can affect healthy cells as well as cancer cells. Report any side effects. Continue your course of treatment even though you feel ill unless your healthcare professional tells you to stop. This medicine can make you more sensitive to cold. Do not drink cold drinks or use ice. Cover exposed skin before coming in contact with cold temperatures or cold objects. When out in cold weather wear warm clothing  and cover your mouth and nose to warm the air that goes into your lungs. Tell your doctor if you get sensitive to the cold. Do not become pregnant while taking this medicine or for 9 months after stopping it. Women should inform their health care professional if they wish to become pregnant or think they might be pregnant. Men should not father a child while taking this medicine and for 6 months after stopping it. There is potential for serious side effects to an unborn child. Talk to your health care professional for more information. Do not breast-feed a child while taking this medicine or for 3 months after stopping it. This medicine has caused ovarian failure in some women. This medicine may make it more difficult to get pregnant. Talk to your health care professional if you are concerned about your fertility. This medicine has caused decreased sperm counts in some men. This may make it more difficult to father a child. Talk to your health care professional if you are concerned about your fertility. This medicine may increase your risk of getting an infection. Call your health care professional for advice if you get a fever, chills, or sore throat, or other symptoms of a cold or flu. Do not treat yourself. Try to avoid being around people who are sick. Avoid taking medicines that contain aspirin, acetaminophen, ibuprofen, naproxen, or ketoprofen unless instructed by your health care professional. These medicines may hide a fever. Be careful brushing or flossing your teeth or using a toothpick because you may get an infection or bleed more easily. If you have any dental work done, tell your dentist you are receiving this medicine. What side effects may I notice from receiving this medication? Side effects that you should report to your doctor or health care professional as soon as possible: allergic reactions like skin rash, itching or hives, swelling of the face, lips, or tongue breathing  problems cough low blood counts - this medicine may decrease the number of white blood cells, red blood cells, and platelets. You may be at increased risk for infections and bleeding nausea, vomiting pain, redness, or irritation at site where injected pain, tingling, numbness in the hands or feet signs and symptoms of bleeding such as bloody or black, tarry stools; red or dark brown urine; spitting up blood or brown material that looks like coffee grounds; red spots on the skin; unusual bruising or bleeding from the eyes, gums, or nose signs and symptoms of a dangerous change in heartbeat or heart rhythm like chest pain; dizziness; fast, irregular heartbeat; palpitations; feeling faint or lightheaded; falls signs and symptoms of infection like fever; chills; cough; sore throat; pain or trouble passing urine signs and symptoms of liver injury like dark yellow or brown urine; general ill feeling or flu-like symptoms; light-colored stools; loss of appetite; nausea; right upper belly pain; unusually weak or tired; yellowing of the eyes or skin signs and symptoms of  low red blood cells or anemia such as unusually weak or tired; feeling faint or lightheaded; falls signs and symptoms of muscle injury like dark urine; trouble passing urine or change in the amount of urine; unusually weak or tired; muscle pain; back pain Side effects that usually do not require medical attention (report to your doctor or health care professional if they continue or are bothersome): changes in taste diarrhea gas hair loss loss of appetite mouth sores This list may not describe all possible side effects. Call your doctor for medical advice about side effects. You may report side effects to FDA at 1-800-FDA-1088. Where should I keep my medication? This drug is given in a hospital or clinic and will not be stored at home. NOTE: This sheet is a summary. It may not cover all possible information. If you have questions about  this medicine, talk to your doctor, pharmacist, or health care provider.  2022 Elsevier/Gold Standard (2021-06-17 00:00:00)  Leucovorin injection What is this medication? LEUCOVORIN (loo koe VOR in) is used to prevent or treat the harmful effects of some medicines. This medicine is used to treat anemia caused by a low amount of folic acid in the body. It is also used with 5-fluorouracil (5-FU) to treat colon cancer. This medicine may be used for other purposes; ask your health care provider or pharmacist if you have questions. What should I tell my care team before I take this medication? They need to know if you have any of these conditions: anemia from low levels of vitamin B-12 in the blood an unusual or allergic reaction to leucovorin, folic acid, other medicines, foods, dyes, or preservatives pregnant or trying to get pregnant breast-feeding How should I use this medication? This medicine is for injection into a muscle or into a vein. It is given by a health care professional in a hospital or clinic setting. Talk to your pediatrician regarding the use of this medicine in children. Special care may be needed. Overdosage: If you think you have taken too much of this medicine contact a poison control center or emergency room at once. NOTE: This medicine is only for you. Do not share this medicine with others. What if I miss a dose? This does not apply. What may interact with this medication? capecitabine fluorouracil phenobarbital phenytoin primidone trimethoprim-sulfamethoxazole This list may not describe all possible interactions. Give your health care provider a list of all the medicines, herbs, non-prescription drugs, or dietary supplements you use. Also tell them if you smoke, drink alcohol, or use illegal drugs. Some items may interact with your medicine. What should I watch for while using this medication? Your condition will be monitored carefully while you are receiving this  medicine. This medicine may increase the side effects of 5-fluorouracil, 5-FU. Tell your doctor or health care professional if you have diarrhea or mouth sores that do not get better or that get worse. What side effects may I notice from receiving this medication? Side effects that you should report to your doctor or health care professional as soon as possible: allergic reactions like skin rash, itching or hives, swelling of the face, lips, or tongue breathing problems fever, infection mouth sores unusual bleeding or bruising unusually weak or tired Side effects that usually do not require medical attention (report to your doctor or health care professional if they continue or are bothersome): constipation or diarrhea loss of appetite nausea, vomiting This list may not describe all possible side effects. Call your doctor for medical  advice about side effects. You may report side effects to FDA at 1-800-FDA-1088. Where should I keep my medication? This drug is given in a hospital or clinic and will not be stored at home. NOTE: This sheet is a summary. It may not cover all possible information. If you have questions about this medicine, talk to your doctor, pharmacist, or health care provider.  2022 Elsevier/Gold Standard (2008-04-05 00:00:00)  Fluorouracil, 5-FU injection What is this medication? FLUOROURACIL, 5-FU (flure oh YOOR a sil) is a chemotherapy drug. It slows the growth of cancer cells. This medicine is used to treat many types of cancer like breast cancer, colon or rectal cancer, pancreatic cancer, and stomach cancer. This medicine may be used for other purposes; ask your health care provider or pharmacist if you have questions. COMMON BRAND NAME(S): Adrucil What should I tell my care team before I take this medication? They need to know if you have any of these conditions: blood disorders dihydropyrimidine dehydrogenase (DPD) deficiency infection (especially a virus  infection such as chickenpox, cold sores, or herpes) kidney disease liver disease malnourished, poor nutrition recent or ongoing radiation therapy an unusual or allergic reaction to fluorouracil, other chemotherapy, other medicines, foods, dyes, or preservatives pregnant or trying to get pregnant breast-feeding How should I use this medication? This drug is given as an infusion or injection into a vein. It is administered in a hospital or clinic by a specially trained health care professional. Talk to your pediatrician regarding the use of this medicine in children. Special care may be needed. Overdosage: If you think you have taken too much of this medicine contact a poison control center or emergency room at once. NOTE: This medicine is only for you. Do not share this medicine with others. What if I miss a dose? It is important not to miss your dose. Call your doctor or health care professional if you are unable to keep an appointment. What may interact with this medication? Do not take this medicine with any of the following medications: live virus vaccines This medicine may also interact with the following medications: medicines that treat or prevent blood clots like warfarin, enoxaparin, and dalteparin This list may not describe all possible interactions. Give your health care provider a list of all the medicines, herbs, non-prescription drugs, or dietary supplements you use. Also tell them if you smoke, drink alcohol, or use illegal drugs. Some items may interact with your medicine. What should I watch for while using this medication? Visit your doctor for checks on your progress. This drug may make you feel generally unwell. This is not uncommon, as chemotherapy can affect healthy cells as well as cancer cells. Report any side effects. Continue your course of treatment even though you feel ill unless your doctor tells you to stop. In some cases, you may be given additional medicines to  help with side effects. Follow all directions for their use. Call your doctor or health care professional for advice if you get a fever, chills or sore throat, or other symptoms of a cold or flu. Do not treat yourself. This drug decreases your body's ability to fight infections. Try to avoid being around people who are sick. This medicine may increase your risk to bruise or bleed. Call your doctor or health care professional if you notice any unusual bleeding. Be careful brushing and flossing your teeth or using a toothpick because you may get an infection or bleed more easily. If you have any dental work done,  tell your dentist you are receiving this medicine. Avoid taking products that contain aspirin, acetaminophen, ibuprofen, naproxen, or ketoprofen unless instructed by your doctor. These medicines may hide a fever. Do not become pregnant while taking this medicine. Women should inform their doctor if they wish to become pregnant or think they might be pregnant. There is a potential for serious side effects to an unborn child. Talk to your health care professional or pharmacist for more information. Do not breast-feed an infant while taking this medicine. Men should inform their doctor if they wish to father a child. This medicine may lower sperm counts. Do not treat diarrhea with over the counter products. Contact your doctor if you have diarrhea that lasts more than 2 days or if it is severe and watery. This medicine can make you more sensitive to the sun. Keep out of the sun. If you cannot avoid being in the sun, wear protective clothing and use sunscreen. Do not use sun lamps or tanning beds/booths. What side effects may I notice from receiving this medication? Side effects that you should report to your doctor or health care professional as soon as possible: allergic reactions like skin rash, itching or hives, swelling of the face, lips, or tongue low blood counts - this medicine may decrease  the number of white blood cells, red blood cells and platelets. You may be at increased risk for infections and bleeding. signs of infection - fever or chills, cough, sore throat, pain or difficulty passing urine signs of decreased platelets or bleeding - bruising, pinpoint red spots on the skin, black, tarry stools, blood in the urine signs of decreased red blood cells - unusually weak or tired, fainting spells, lightheadedness breathing problems changes in vision chest pain mouth sores nausea and vomiting pain, swelling, redness at site where injected pain, tingling, numbness in the hands or feet redness, swelling, or sores on hands or feet stomach pain unusual bleeding Side effects that usually do not require medical attention (report to your doctor or health care professional if they continue or are bothersome): changes in finger or toe nails diarrhea dry or itchy skin hair loss headache loss of appetite sensitivity of eyes to the light stomach upset unusually teary eyes This list may not describe all possible side effects. Call your doctor for medical advice about side effects. You may report side effects to FDA at 1-800-FDA-1088. Where should I keep my medication? This drug is given in a hospital or clinic and will not be stored at home. NOTE: This sheet is a summary. It may not cover all possible information. If you have questions about this medicine, talk to your doctor, pharmacist, or health care provider.  2022 Elsevier/Gold Standard (2021-06-17 00:00:00)

## 2021-10-03 ENCOUNTER — Inpatient Hospital Stay: Payer: Medicare HMO

## 2021-10-03 ENCOUNTER — Other Ambulatory Visit: Payer: Self-pay

## 2021-10-03 VITALS — BP 155/94 | HR 85 | Temp 99.0°F | Resp 18

## 2021-10-03 DIAGNOSIS — C186 Malignant neoplasm of descending colon: Secondary | ICD-10-CM

## 2021-10-03 DIAGNOSIS — Z5111 Encounter for antineoplastic chemotherapy: Secondary | ICD-10-CM | POA: Diagnosis not present

## 2021-10-03 MED ORDER — SODIUM CHLORIDE 0.9% FLUSH
10.0000 mL | INTRAVENOUS | Status: DC | PRN
  Administered 2021-10-03: 13:00:00 10 mL

## 2021-10-03 MED ORDER — HEPARIN SOD (PORK) LOCK FLUSH 100 UNIT/ML IV SOLN
500.0000 [IU] | Freq: Once | INTRAVENOUS | Status: AC | PRN
  Administered 2021-10-03: 13:00:00 500 [IU]

## 2021-10-13 ENCOUNTER — Other Ambulatory Visit: Payer: Self-pay | Admitting: Oncology

## 2021-10-15 ENCOUNTER — Inpatient Hospital Stay: Payer: Medicare HMO

## 2021-10-15 ENCOUNTER — Inpatient Hospital Stay: Payer: Medicare HMO | Admitting: Nurse Practitioner

## 2021-10-16 ENCOUNTER — Inpatient Hospital Stay: Payer: Medicare HMO | Attending: Oncology

## 2021-10-16 ENCOUNTER — Other Ambulatory Visit: Payer: Self-pay

## 2021-10-16 ENCOUNTER — Inpatient Hospital Stay: Payer: Medicare HMO

## 2021-10-16 ENCOUNTER — Inpatient Hospital Stay (HOSPITAL_BASED_OUTPATIENT_CLINIC_OR_DEPARTMENT_OTHER): Payer: Medicare HMO | Admitting: Nurse Practitioner

## 2021-10-16 ENCOUNTER — Encounter: Payer: Self-pay | Admitting: Nurse Practitioner

## 2021-10-16 VITALS — BP 145/90 | HR 67

## 2021-10-16 VITALS — BP 140/90 | HR 82 | Temp 97.8°F | Resp 18 | Ht 61.0 in | Wt 118.0 lb

## 2021-10-16 DIAGNOSIS — L819 Disorder of pigmentation, unspecified: Secondary | ICD-10-CM | POA: Insufficient documentation

## 2021-10-16 DIAGNOSIS — Z5111 Encounter for antineoplastic chemotherapy: Secondary | ICD-10-CM | POA: Diagnosis present

## 2021-10-16 DIAGNOSIS — F17210 Nicotine dependence, cigarettes, uncomplicated: Secondary | ICD-10-CM | POA: Insufficient documentation

## 2021-10-16 DIAGNOSIS — I4891 Unspecified atrial fibrillation: Secondary | ICD-10-CM | POA: Insufficient documentation

## 2021-10-16 DIAGNOSIS — I10 Essential (primary) hypertension: Secondary | ICD-10-CM | POA: Insufficient documentation

## 2021-10-16 DIAGNOSIS — C186 Malignant neoplasm of descending colon: Secondary | ICD-10-CM

## 2021-10-16 DIAGNOSIS — Z5189 Encounter for other specified aftercare: Secondary | ICD-10-CM | POA: Diagnosis not present

## 2021-10-16 DIAGNOSIS — J449 Chronic obstructive pulmonary disease, unspecified: Secondary | ICD-10-CM | POA: Insufficient documentation

## 2021-10-16 DIAGNOSIS — Z79899 Other long term (current) drug therapy: Secondary | ICD-10-CM | POA: Diagnosis not present

## 2021-10-16 LAB — CBC WITH DIFFERENTIAL (CANCER CENTER ONLY)
Abs Immature Granulocytes: 0.01 10*3/uL (ref 0.00–0.07)
Basophils Absolute: 0 10*3/uL (ref 0.0–0.1)
Basophils Relative: 1 %
Eosinophils Absolute: 0 10*3/uL (ref 0.0–0.5)
Eosinophils Relative: 0 %
HCT: 30 % — ABNORMAL LOW (ref 36.0–46.0)
Hemoglobin: 9.4 g/dL — ABNORMAL LOW (ref 12.0–15.0)
Immature Granulocytes: 0 %
Lymphocytes Relative: 28 %
Lymphs Abs: 1.2 10*3/uL (ref 0.7–4.0)
MCH: 26 pg (ref 26.0–34.0)
MCHC: 31.3 g/dL (ref 30.0–36.0)
MCV: 83.1 fL (ref 80.0–100.0)
Monocytes Absolute: 0.5 10*3/uL (ref 0.1–1.0)
Monocytes Relative: 12 %
Neutro Abs: 2.4 10*3/uL (ref 1.7–7.7)
Neutrophils Relative %: 59 %
Platelet Count: 151 10*3/uL (ref 150–400)
RBC: 3.61 MIL/uL — ABNORMAL LOW (ref 3.87–5.11)
RDW: 21 % — ABNORMAL HIGH (ref 11.5–15.5)
WBC Count: 4.1 10*3/uL (ref 4.0–10.5)
nRBC: 0 % (ref 0.0–0.2)

## 2021-10-16 LAB — CMP (CANCER CENTER ONLY)
ALT: 9 U/L (ref 0–44)
AST: 23 U/L (ref 15–41)
Albumin: 3.8 g/dL (ref 3.5–5.0)
Alkaline Phosphatase: 70 U/L (ref 38–126)
Anion gap: 8 (ref 5–15)
BUN: 16 mg/dL (ref 8–23)
CO2: 26 mmol/L (ref 22–32)
Calcium: 9.1 mg/dL (ref 8.9–10.3)
Chloride: 107 mmol/L (ref 98–111)
Creatinine: 0.87 mg/dL (ref 0.44–1.00)
GFR, Estimated: >60 mL/min (ref >60)
Glucose, Bld: 94 mg/dL (ref 70–99)
Potassium: 4 mmol/L (ref 3.5–5.1)
Sodium: 141 mmol/L (ref 135–145)
Total Bilirubin: 0.5 mg/dL (ref 0.3–1.2)
Total Protein: 6.4 g/dL — ABNORMAL LOW (ref 6.5–8.1)

## 2021-10-16 MED ORDER — PALONOSETRON HCL INJECTION 0.25 MG/5ML
0.2500 mg | Freq: Once | INTRAVENOUS | Status: AC
  Administered 2021-10-16: 0.25 mg via INTRAVENOUS
  Filled 2021-10-16: qty 5

## 2021-10-16 MED ORDER — LEUCOVORIN CALCIUM INJECTION 350 MG
400.0000 mg/m2 | Freq: Once | INTRAVENOUS | Status: AC
  Administered 2021-10-16: 612 mg via INTRAVENOUS
  Filled 2021-10-16: qty 30.6

## 2021-10-16 MED ORDER — OXALIPLATIN CHEMO INJECTION 100 MG/20ML
85.0000 mg/m2 | Freq: Once | INTRAVENOUS | Status: AC
  Administered 2021-10-16: 130 mg via INTRAVENOUS
  Filled 2021-10-16: qty 26

## 2021-10-16 MED ORDER — DEXTROSE 5 % IV SOLN: Freq: Once | INTRAVENOUS | Status: AC

## 2021-10-16 MED ORDER — FLUOROURACIL CHEMO INJECTION 2.5 GM/50ML
400.0000 mg/m2 | Freq: Once | INTRAVENOUS | Status: AC
  Administered 2021-10-16: 600 mg via INTRAVENOUS
  Filled 2021-10-16: qty 12

## 2021-10-16 MED ORDER — SODIUM CHLORIDE 0.9 % IV SOLN
10.0000 mg | Freq: Once | INTRAVENOUS | Status: AC
  Administered 2021-10-16: 10 mg via INTRAVENOUS
  Filled 2021-10-16: qty 1

## 2021-10-16 MED ORDER — SODIUM CHLORIDE 0.9 % IV SOLN
2400.0000 mg/m2 | INTRAVENOUS | Status: DC
  Administered 2021-10-16: 3650 mg via INTRAVENOUS
  Filled 2021-10-16: qty 73

## 2021-10-16 NOTE — Progress Notes (Signed)
Patient presents for treatment. RN assessment completed along with the following:  Labs/vitals reviewed - Yes, and within treatment parameters.   Weight within 10% of previous measurement - Yes Oncology Treatment Attestation completed for current therapy- Yes, on date 08/19/21 Informed consent completed and reflects current therapy/intent - Yes, on date 09/03/21             Provider progress note reviewed - Yes, today's provider note was reviewed. Treatment/Antibody/Supportive plan reviewed - Yes, and there are no adjustments needed for today's treatment. S&H and other orders reviewed - Yes, and there are no additional orders identified. Previous treatment date reviewed - Yes, and the appropriate amount of time has elapsed between treatments. Clinic Hand Off Received from - none   Patient to proceed with treatment.

## 2021-10-16 NOTE — Progress Notes (Signed)
Patient seen by Lisa Thomas NP today  Vitals are within treatment parameters.  Labs reviewed by Lisa Thomas NP and are within treatment parameters.  Per physician team, patient is ready for treatment and there are NO modifications to the treatment plan.     

## 2021-10-16 NOTE — Progress Notes (Signed)
[facility] OFFICE PROGRESS NOTE   Diagnosis: Colon cancer  INTERVAL HISTORY:   Kelsey Bridges returns as scheduled.  She completed cycle 3 FOLFOX 10/01/2021.  No nausea or vomiting around the time of chemotherapy.  Left buccal region sore for a few days.  No ulcers.  Good appetite.  No diarrhea.  She did not experience cold sensitivity.  She has occasional numbness/tingling in the toes.  Objective:  Vital signs in last 24 hours:  Blood pressure 140/90, pulse 82, temperature 97.8 F (36.6 C), temperature source Oral, resp. rate 18, height $RemoveBe'5\' 1"'xlvgIheyh$  (1.549 m), weight 118 lb (53.5 kg), SpO2 100 %.    HEENT: Mild white coating over tongue.  No buccal thrush.  No ulcers. Resp: Lungs clear bilaterally. Cardio: Regular rate and rhythm. GI: Abdomen soft and nontender.  No hepatomegaly. Vascular: No leg edema. Neuro: Vibratory sense intact over the fingertips per tuning fork exam. Skin: Palms with hyperpigmentation.  No erythema.  No skin breakdown. Port-A-Cath without erythema.   Lab Results:  Lab Results  Component Value Date   WBC 4.1 10/16/2021   HGB 9.4 (L) 10/16/2021   HCT 30.0 (L) 10/16/2021   MCV 83.1 10/16/2021   PLT 151 10/16/2021   NEUTROABS 2.4 10/16/2021    Imaging:  No results found.  Medications: I have reviewed the patient's current medications.  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 0/12 lymph nodes, 2 tumor deposits, no lymphovascular perineural invasion MSI-high, loss of MSH2 and MSH6 expression Seen by genetics, testing completed, no pathogenic mutations (see note 08/22/2021) Colonoscopy 04/03/2021-partially obstructing descending colon mass, adenocarcinoma CTs 04/18/2021-descending colon mass, no evidence of metastatic disease, emphysema Elevated preoperative CEA, 04/03/2021 08/19/2021 repeat CEA 10 Cycle 1 FOLFOX 09/03/2021 Cycle 2 FOLFOX 09/17/2021 09/17/2021 repeat CEA  3.28 Cycle 3 FOLFOX 10/01/2021 Cycle 4 FOLFOX 10/16/2021   Atrial fibrillation Tobacco use 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 pylori June 2022 Hypertension    Disposition: Kelsey Bridges appears stable.  She has completed 3 cycles of FOLFOX.  She continues to tolerate well.  Plan to proceed with cycle 4 today as scheduled.  CBC from today reviewed.  Counts adequate to proceed with treatment.  She will return for lab, follow-up, cycle 5 FOLFOX in 2 weeks.  We are available to see her sooner if needed.    Ned Card ANP/GNP-BC   10/16/2021  8:41 AM

## 2021-10-16 NOTE — Patient Instructions (Addendum)
[facility]  The chemotherapy medication bag should finish at 46 hours, 96 hours, or 7 days. For example, if your pump is scheduled for 46 hours and it was put on at 4:00 p.m., it should finish at 2:00 p.m. the day it is scheduled to come off regardless of your appointment time.     Estimated time to finish at  11:00 Saturday, October 18, 2021.   If the display on your pump reads "Low Volume" and it is beeping, take the batteries out of the pump and come to the cancer center for it to be taken off.   If the pump alarms go off prior to the pump reading "Low Volume" then call [phone] and someone can assist you.  If the plunger comes out and the chemotherapy medication is leaking out, please use your home chemo spill kit to clean up the spill. Do NOT use paper towels or other household products.  If you have problems or questions regarding your pump, please call either [phone] (24 hours a day) or the cancer center Monday-Friday 8:00 a.m.- 4:30 p.m. at the clinic number and we will assist you. If you are unable to get assistance, then go to the nearest Emergency Department and ask the staff to contact the IV team for assistance.  Discharge Instructions: Thank you for choosing [facility] to provide your oncology and hematology care.   If you have a lab appointment with the [facility], please go directly to the [facility] and check in at the registration area.   Wear comfortable clothing and clothing appropriate for easy access to any Portacath or PICC line.   We strive to give you quality time with your provider. You may need to reschedule your appointment if you arrive late (15 or more minutes).  Arriving late affects you and other patients whose appointments are after yours.  Also, if you miss three or more appointments without notifying the office, you may be dismissed from the clinic at the providers discretion.      For prescription  refill requests, have your pharmacy contact our office and allow 72 hours for refills to be completed.    Today you received the following chemotherapy and/or immunotherapy agents Oxaliplatin, leucovorin, fluorouracil      To help prevent nausea and vomiting after your treatment, we encourage you to take your nausea medication as directed.  BELOW ARE SYMPTOMS THAT SHOULD BE REPORTED IMMEDIATELY: *FEVER GREATER THAN 100.4 F (38 C) OR HIGHER *CHILLS OR SWEATING *NAUSEA AND VOMITING THAT IS NOT CONTROLLED WITH YOUR NAUSEA MEDICATION *UNUSUAL SHORTNESS OF BREATH *UNUSUAL BRUISING OR BLEEDING *URINARY PROBLEMS (pain or burning when urinating, or frequent urination) *BOWEL PROBLEMS (unusual diarrhea, constipation, pain near the anus) TENDERNESS IN MOUTH AND THROAT WITH OR WITHOUT PRESENCE OF ULCERS (sore throat, sores in mouth, or a toothache) UNUSUAL RASH, SWELLING OR PAIN  UNUSUAL VAGINAL DISCHARGE OR ITCHING   Items with * indicate a potential emergency and should be followed up as soon as possible or go to the Emergency Department if any problems should occur.  Please show the CHEMOTHERAPY ALERT CARD or IMMUNOTHERAPY ALERT CARD at check-in to the Emergency Department and triage nurse.  Should you have questions after your visit or need to cancel or reschedule your appointment, please contact [facility]  Dept: [phone]  and follow the prompts.  Office hours are 8:00 a.m. to 4:30 p.m. Monday - Friday. Please note that voicemails  left after 4:00 p.m. may not be returned until the following business day.  We are closed weekends and major holidays. You have access to a nurse at all times for urgent questions. Please call the main number to the clinic Dept: [phone] and follow the prompts.   For any non-urgent questions, you may also contact your provider using MyChart. We now offer e-Visits for anyone 48 and older to request care online for non-urgent symptoms.  For details visit [email or web address].   Also download the MyChart app! Go to the app store, search "MyChart", open the app, select [facility], and log in with your MyChart username and password.  Due to Covid, a mask is required upon entering the hospital/clinic. If you do not have a mask, one will be given to you upon arrival. For doctor visits, patients may have 1 support person aged 49 or older with them. For treatment visits, patients cannot have anyone with them due to current Covid guidelines and our immunocompromised population.   Oxaliplatin Injection What is this medication? OXALIPLATIN (ox AL i PLA tin) is a chemotherapy drug. It targets fast dividing cells, like cancer cells, and causes these cells to die. This medicine is used to treat cancers of the colon and rectum, and many other cancers. This medicine may be used for other purposes; ask your health care provider or pharmacist if you have questions. COMMON BRAND NAME(S): Eloxatin What should I tell my care team before I take this medication? They need to know if you have any of these conditions: heart disease history of irregular heartbeat liver disease low blood counts, like white cells, platelets, or red blood cells lung or breathing disease, like asthma take medicines that treat or prevent blood clots tingling of the fingers or toes, or other nerve disorder an unusual or allergic reaction to oxaliplatin, other chemotherapy, other medicines, foods, dyes, or preservatives pregnant or trying to get pregnant breast-feeding How should I use this medication? This drug is given as an infusion into a vein. It is administered in a hospital or clinic by a specially trained health care professional. Talk to your pediatrician regarding the use of this medicine in children. Special care may be needed. Overdosage: If you think you have taken too much of this medicine contact a poison control center or emergency room at once. NOTE:  This medicine is only for you. Do not share this medicine with others. What if I miss a dose? It is important not to miss a dose. Call your doctor or health care professional if you are unable to keep an appointment. What may interact with this medication? Do not take this medicine with any of the following medications: cisapride dronedarone pimozide thioridazine This medicine may also interact with the following medications: aspirin and aspirin-like medicines certain medicines that treat or prevent blood clots like warfarin, apixaban, dabigatran, and rivaroxaban cisplatin cyclosporine diuretics medicines for infection like acyclovir, adefovir, amphotericin B, bacitracin, cidofovir, foscarnet, ganciclovir, gentamicin, pentamidine, vancomycin NSAIDs, medicines for pain and inflammation, like ibuprofen or naproxen other medicines that prolong the QT interval (an abnormal heart rhythm) pamidronate zoledronic acid This list may not describe all possible interactions. Give your health care provider a list of all the medicines, herbs, non-prescription drugs, or dietary supplements you use. Also tell them if you smoke, drink alcohol, or use illegal drugs. Some items may interact with your medicine. What should I watch for while using this medication? Your condition will be monitored carefully while you are receiving this  medicine. You may need blood work done while you are taking this medicine. This medicine may make you feel generally unwell. This is not uncommon as chemotherapy can affect healthy cells as well as cancer cells. Report any side effects. Continue your course of treatment even though you feel ill unless your healthcare professional tells you to stop. This medicine can make you more sensitive to cold. Do not drink cold drinks or use ice. Cover exposed skin before coming in contact with cold temperatures or cold objects. When out in cold weather wear warm clothing and cover your mouth  and nose to warm the air that goes into your lungs. Tell your doctor if you get sensitive to the cold. Do not become pregnant while taking this medicine or for 9 months after stopping it. Women should inform their health care professional if they wish to become pregnant or think they might be pregnant. Men should not father a child while taking this medicine and for 6 months after stopping it. There is potential for serious side effects to an unborn child. Talk to your health care professional for more information. Do not breast-feed a child while taking this medicine or for 3 months after stopping it. This medicine has caused ovarian failure in some women. This medicine may make it more difficult to get pregnant. Talk to your health care professional if you are concerned about your fertility. This medicine has caused decreased sperm counts in some men. This may make it more difficult to father a child. Talk to your health care professional if you are concerned about your fertility. This medicine may increase your risk of getting an infection. Call your health care professional for advice if you get a fever, chills, or sore throat, or other symptoms of a cold or flu. Do not treat yourself. Try to avoid being around people who are sick. Avoid taking medicines that contain aspirin, acetaminophen, ibuprofen, naproxen, or ketoprofen unless instructed by your health care professional. These medicines may hide a fever. Be careful brushing or flossing your teeth or using a toothpick because you may get an infection or bleed more easily. If you have any dental work done, tell your dentist you are receiving this medicine. What side effects may I notice from receiving this medication? Side effects that you should report to your doctor or health care professional as soon as possible: allergic reactions like skin rash, itching or hives, swelling of the face, lips, or tongue breathing problems cough low blood counts  - this medicine may decrease the number of white blood cells, red blood cells, and platelets. You may be at increased risk for infections and bleeding nausea, vomiting pain, redness, or irritation at site where injected pain, tingling, numbness in the hands or feet signs and symptoms of bleeding such as bloody or black, tarry stools; red or dark brown urine; spitting up blood or brown material that looks like coffee grounds; red spots on the skin; unusual bruising or bleeding from the eyes, gums, or nose signs and symptoms of a dangerous change in heartbeat or heart rhythm like chest pain; dizziness; fast, irregular heartbeat; palpitations; feeling faint or lightheaded; falls signs and symptoms of infection like fever; chills; cough; sore throat; pain or trouble passing urine signs and symptoms of liver injury like dark yellow or brown urine; general ill feeling or flu-like symptoms; light-colored stools; loss of appetite; nausea; right upper belly pain; unusually weak or tired; yellowing of the eyes or skin signs and symptoms of  low red blood cells or anemia such as unusually weak or tired; feeling faint or lightheaded; falls signs and symptoms of muscle injury like dark urine; trouble passing urine or change in the amount of urine; unusually weak or tired; muscle pain; back pain Side effects that usually do not require medical attention (report to your doctor or health care professional if they continue or are bothersome): changes in taste diarrhea gas hair loss loss of appetite mouth sores This list may not describe all possible side effects. Call your doctor for medical advice about side effects. You may report side effects to FDA at 1-800-FDA-1088. Where should I keep my medication? This drug is given in a hospital or clinic and will not be stored at home. NOTE: This sheet is a summary. It may not cover all possible information. If you have questions about this medicine, talk to your doctor,  pharmacist, or health care provider.  2022 Elsevier/Gold Standard (2021-06-17 00:00:00)  Leucovorin injection What is this medication? LEUCOVORIN (loo koe VOR in) is used to prevent or treat the harmful effects of some medicines. This medicine is used to treat anemia caused by a low amount of folic acid in the body. It is also used with 5-fluorouracil (5-FU) to treat colon cancer. This medicine may be used for other purposes; ask your health care provider or pharmacist if you have questions. What should I tell my care team before I take this medication? They need to know if you have any of these conditions: anemia from low levels of vitamin B-12 in the blood an unusual or allergic reaction to leucovorin, folic acid, other medicines, foods, dyes, or preservatives pregnant or trying to get pregnant breast-feeding How should I use this medication? This medicine is for injection into a muscle or into a vein. It is given by a health care professional in a hospital or clinic setting. Talk to your pediatrician regarding the use of this medicine in children. Special care may be needed. Overdosage: If you think you have taken too much of this medicine contact a poison control center or emergency room at once. NOTE: This medicine is only for you. Do not share this medicine with others. What if I miss a dose? This does not apply. What may interact with this medication? capecitabine fluorouracil phenobarbital phenytoin primidone trimethoprim-sulfamethoxazole This list may not describe all possible interactions. Give your health care provider a list of all the medicines, herbs, non-prescription drugs, or dietary supplements you use. Also tell them if you smoke, drink alcohol, or use illegal drugs. Some items may interact with your medicine. What should I watch for while using this medication? Your condition will be monitored carefully while you are receiving this medicine. This medicine may  increase the side effects of 5-fluorouracil, 5-FU. Tell your doctor or health care professional if you have diarrhea or mouth sores that do not get better or that get worse. What side effects may I notice from receiving this medication? Side effects that you should report to your doctor or health care professional as soon as possible: allergic reactions like skin rash, itching or hives, swelling of the face, lips, or tongue breathing problems fever, infection mouth sores unusual bleeding or bruising unusually weak or tired Side effects that usually do not require medical attention (report to your doctor or health care professional if they continue or are bothersome): constipation or diarrhea loss of appetite nausea, vomiting This list may not describe all possible side effects. Call your doctor for medical  advice about side effects. You may report side effects to FDA at 1-800-FDA-1088. Where should I keep my medication? This drug is given in a hospital or clinic and will not be stored at home. NOTE: This sheet is a summary. It may not cover all possible information. If you have questions about this medicine, talk to your doctor, pharmacist, or health care provider.  2022 Elsevier/Gold Standard (2008-04-05 00:00:00)  Fluorouracil, 5-FU injection What is this medication? FLUOROURACIL, 5-FU (flure oh YOOR a sil) is a chemotherapy drug. It slows the growth of cancer cells. This medicine is used to treat many types of cancer like breast cancer, colon or rectal cancer, pancreatic cancer, and stomach cancer. This medicine may be used for other purposes; ask your health care provider or pharmacist if you have questions. COMMON BRAND NAME(S): Adrucil What should I tell my care team before I take this medication? They need to know if you have any of these conditions: blood disorders dihydropyrimidine dehydrogenase (DPD) deficiency infection (especially a virus infection such as chickenpox, cold  sores, or herpes) kidney disease liver disease malnourished, poor nutrition recent or ongoing radiation therapy an unusual or allergic reaction to fluorouracil, other chemotherapy, other medicines, foods, dyes, or preservatives pregnant or trying to get pregnant breast-feeding How should I use this medication? This drug is given as an infusion or injection into a vein. It is administered in a hospital or clinic by a specially trained health care professional. Talk to your pediatrician regarding the use of this medicine in children. Special care may be needed. Overdosage: If you think you have taken too much of this medicine contact a poison control center or emergency room at once. NOTE: This medicine is only for you. Do not share this medicine with others. What if I miss a dose? It is important not to miss your dose. Call your doctor or health care professional if you are unable to keep an appointment. What may interact with this medication? Do not take this medicine with any of the following medications: live virus vaccines This medicine may also interact with the following medications: medicines that treat or prevent blood clots like warfarin, enoxaparin, and dalteparin This list may not describe all possible interactions. Give your health care provider a list of all the medicines, herbs, non-prescription drugs, or dietary supplements you use. Also tell them if you smoke, drink alcohol, or use illegal drugs. Some items may interact with your medicine. What should I watch for while using this medication? Visit your doctor for checks on your progress. This drug may make you feel generally unwell. This is not uncommon, as chemotherapy can affect healthy cells as well as cancer cells. Report any side effects. Continue your course of treatment even though you feel ill unless your doctor tells you to stop. In some cases, you may be given additional medicines to help with side effects. Follow all  directions for their use. Call your doctor or health care professional for advice if you get a fever, chills or sore throat, or other symptoms of a cold or flu. Do not treat yourself. This drug decreases your body's ability to fight infections. Try to avoid being around people who are sick. This medicine may increase your risk to bruise or bleed. Call your doctor or health care professional if you notice any unusual bleeding. Be careful brushing and flossing your teeth or using a toothpick because you may get an infection or bleed more easily. If you have any dental work done,  tell your dentist you are receiving this medicine. Avoid taking products that contain aspirin, acetaminophen, ibuprofen, naproxen, or ketoprofen unless instructed by your doctor. These medicines may hide a fever. Do not become pregnant while taking this medicine. Women should inform their doctor if they wish to become pregnant or think they might be pregnant. There is a potential for serious side effects to an unborn child. Talk to your health care professional or pharmacist for more information. Do not breast-feed an infant while taking this medicine. Men should inform their doctor if they wish to father a child. This medicine may lower sperm counts. Do not treat diarrhea with over the counter products. Contact your doctor if you have diarrhea that lasts more than 2 days or if it is severe and watery. This medicine can make you more sensitive to the sun. Keep out of the sun. If you cannot avoid being in the sun, wear protective clothing and use sunscreen. Do not use sun lamps or tanning beds/booths. What side effects may I notice from receiving this medication? Side effects that you should report to your doctor or health care professional as soon as possible: allergic reactions like skin rash, itching or hives, swelling of the face, lips, or tongue low blood counts - this medicine may decrease the number of white blood cells, red  blood cells and platelets. You may be at increased risk for infections and bleeding. signs of infection - fever or chills, cough, sore throat, pain or difficulty passing urine signs of decreased platelets or bleeding - bruising, pinpoint red spots on the skin, black, tarry stools, blood in the urine signs of decreased red blood cells - unusually weak or tired, fainting spells, lightheadedness breathing problems changes in vision chest pain mouth sores nausea and vomiting pain, swelling, redness at site where injected pain, tingling, numbness in the hands or feet redness, swelling, or sores on hands or feet stomach pain unusual bleeding Side effects that usually do not require medical attention (report to your doctor or health care professional if they continue or are bothersome): changes in finger or toe nails diarrhea dry or itchy skin hair loss headache loss of appetite sensitivity of eyes to the light stomach upset unusually teary eyes This list may not describe all possible side effects. Call your doctor for medical advice about side effects. You may report side effects to FDA at 1-800-FDA-1088. Where should I keep my medication? This drug is given in a hospital or clinic and will not be stored at home. NOTE: This sheet is a summary. It may not cover all possible information. If you have questions about this medicine, talk to your doctor, pharmacist, or health care provider.  2022 Elsevier/Gold Standard (2021-06-17 00:00:00)

## 2021-10-17 ENCOUNTER — Inpatient Hospital Stay: Payer: Medicare HMO

## 2021-10-18 ENCOUNTER — Other Ambulatory Visit: Payer: Self-pay

## 2021-10-18 ENCOUNTER — Inpatient Hospital Stay: Payer: Medicare HMO

## 2021-10-18 VITALS — BP 161/89 | HR 92 | Temp 97.7°F | Resp 16

## 2021-10-18 DIAGNOSIS — C186 Malignant neoplasm of descending colon: Secondary | ICD-10-CM

## 2021-10-18 DIAGNOSIS — Z5111 Encounter for antineoplastic chemotherapy: Secondary | ICD-10-CM | POA: Diagnosis not present

## 2021-10-18 MED ORDER — SODIUM CHLORIDE 0.9% FLUSH
10.0000 mL | INTRAVENOUS | Status: DC | PRN
  Administered 2021-10-18: 10 mL

## 2021-10-18 MED ORDER — HEPARIN SOD (PORK) LOCK FLUSH 100 UNIT/ML IV SOLN
500.0000 [IU] | Freq: Once | INTRAVENOUS | Status: AC | PRN
  Administered 2021-10-18: 500 [IU]

## 2021-10-26 ENCOUNTER — Other Ambulatory Visit: Payer: Self-pay | Admitting: Oncology

## 2021-10-29 ENCOUNTER — Other Ambulatory Visit: Payer: Self-pay

## 2021-10-29 ENCOUNTER — Inpatient Hospital Stay: Payer: Medicare HMO

## 2021-10-29 ENCOUNTER — Inpatient Hospital Stay (HOSPITAL_BASED_OUTPATIENT_CLINIC_OR_DEPARTMENT_OTHER): Payer: Medicare HMO | Admitting: Oncology

## 2021-10-29 ENCOUNTER — Encounter: Payer: Self-pay | Admitting: *Deleted

## 2021-10-29 VITALS — BP 120/67 | HR 62 | Resp 18

## 2021-10-29 VITALS — BP 118/84 | HR 68 | Temp 98.1°F | Resp 20 | Ht 61.0 in | Wt 117.6 lb

## 2021-10-29 DIAGNOSIS — Z5111 Encounter for antineoplastic chemotherapy: Secondary | ICD-10-CM | POA: Diagnosis not present

## 2021-10-29 DIAGNOSIS — C186 Malignant neoplasm of descending colon: Secondary | ICD-10-CM

## 2021-10-29 LAB — CMP (CANCER CENTER ONLY)
ALT: 24 U/L (ref 0–44)
AST: 33 U/L (ref 15–41)
Albumin: 3.8 g/dL (ref 3.5–5.0)
Alkaline Phosphatase: 66 U/L (ref 38–126)
Anion gap: 10 (ref 5–15)
BUN: 19 mg/dL (ref 8–23)
CO2: 23 mmol/L (ref 22–32)
Calcium: 9.8 mg/dL (ref 8.9–10.3)
Chloride: 106 mmol/L (ref 98–111)
Creatinine: 0.89 mg/dL (ref 0.44–1.00)
GFR, Estimated: >60 mL/min (ref >60)
Glucose, Bld: 96 mg/dL (ref 70–99)
Potassium: 3.6 mmol/L (ref 3.5–5.1)
Sodium: 139 mmol/L (ref 135–145)
Total Bilirubin: 0.6 mg/dL (ref 0.3–1.2)
Total Protein: 6.5 g/dL (ref 6.5–8.1)

## 2021-10-29 LAB — CBC WITH DIFFERENTIAL (CANCER CENTER ONLY)
Abs Immature Granulocytes: 0.02 10*3/uL (ref 0.00–0.07)
Basophils Absolute: 0 10*3/uL (ref 0.0–0.1)
Basophils Relative: 1 %
Eosinophils Absolute: 0 10*3/uL (ref 0.0–0.5)
Eosinophils Relative: 0 %
HCT: 30.9 % — ABNORMAL LOW (ref 36.0–46.0)
Hemoglobin: 9.6 g/dL — ABNORMAL LOW (ref 12.0–15.0)
Immature Granulocytes: 0 %
Lymphocytes Relative: 27 %
Lymphs Abs: 1.2 10*3/uL (ref 0.7–4.0)
MCH: 26.2 pg (ref 26.0–34.0)
MCHC: 31.1 g/dL (ref 30.0–36.0)
MCV: 84.4 fL (ref 80.0–100.0)
Monocytes Absolute: 0.6 10*3/uL (ref 0.1–1.0)
Monocytes Relative: 14 %
Neutro Abs: 2.7 10*3/uL (ref 1.7–7.7)
Neutrophils Relative %: 58 %
Platelet Count: 84 10*3/uL — ABNORMAL LOW (ref 150–400)
RBC: 3.66 MIL/uL — ABNORMAL LOW (ref 3.87–5.11)
RDW: 21.5 % — ABNORMAL HIGH (ref 11.5–15.5)
WBC Count: 4.7 10*3/uL (ref 4.0–10.5)
nRBC: 0 % (ref 0.0–0.2)

## 2021-10-29 LAB — CEA (ACCESS): CEA (CHCC): 3.78 ng/mL (ref 0.00–5.00)

## 2021-10-29 MED ORDER — LEUCOVORIN CALCIUM INJECTION 350 MG
400.0000 mg/m2 | Freq: Once | INTRAVENOUS | Status: AC
  Administered 2021-10-29: 604 mg via INTRAVENOUS
  Filled 2021-10-29: qty 25

## 2021-10-29 MED ORDER — PALONOSETRON HCL INJECTION 0.25 MG/5ML
0.2500 mg | Freq: Once | INTRAVENOUS | Status: AC
  Administered 2021-10-29: 0.25 mg via INTRAVENOUS
  Filled 2021-10-29: qty 5

## 2021-10-29 MED ORDER — OXALIPLATIN CHEMO INJECTION 100 MG/20ML
65.0000 mg/m2 | Freq: Once | INTRAVENOUS | Status: AC
  Administered 2021-10-29: 100 mg via INTRAVENOUS
  Filled 2021-10-29: qty 20

## 2021-10-29 MED ORDER — FLUOROURACIL CHEMO INJECTION 2.5 GM/50ML
400.0000 mg/m2 | Freq: Once | INTRAVENOUS | Status: AC
  Administered 2021-10-29: 600 mg via INTRAVENOUS
  Filled 2021-10-29: qty 12

## 2021-10-29 MED ORDER — DEXTROSE 5 % IV SOLN: Freq: Once | INTRAVENOUS | Status: AC

## 2021-10-29 MED ORDER — SODIUM CHLORIDE 0.9 % IV SOLN
10.0000 mg | Freq: Once | INTRAVENOUS | Status: AC
  Administered 2021-10-29: 10 mg via INTRAVENOUS
  Filled 2021-10-29: qty 10

## 2021-10-29 MED ORDER — SODIUM CHLORIDE 0.9 % IV SOLN
2400.0000 mg/m2 | INTRAVENOUS | Status: DC
  Administered 2021-10-29: 3600 mg via INTRAVENOUS
  Filled 2021-10-29: qty 72

## 2021-10-29 MED ORDER — LEUCOVORIN CALCIUM INJECTION 350 MG
400.0000 mg/m2 | Freq: Once | INTRAVENOUS | Status: DC
  Filled 2021-10-29: qty 30.2

## 2021-10-29 NOTE — Progress Notes (Signed)
Patient seen by Dr. Benay Spice today  Vitals are within treatment parameters.  Labs reviewed by Dr. Benay Spice and are not all within treatment parameters. OK to treat w/platelet count 84,000.  Per physician team, patient is ready for treatment. Please note that modifications are being made to the treatment plan including Dose reduction of oxalipaltin

## 2021-10-29 NOTE — Patient Instructions (Signed)

## 2021-10-29 NOTE — Patient Instructions (Addendum)
[place]  The chemotherapy medication bag should finish at 46 hours, 96 hours, or 7 days. For example, if your pump is scheduled for 46 hours and it was put on at 4:00 p.m., it should finish at 2:00 p.m. the day it is scheduled to come off regardless of your appointment time.     Estimated time to finish at 11:45am Friday, October 31, 2021.   If the display on your pump reads "Low Volume" and it is beeping, take the batteries out of the pump and come to the cancer center for it to be taken off.   If the pump alarms go off prior to the pump reading "Low Volume" then call [phone] and someone can assist you.  If the plunger comes out and the chemotherapy medication is leaking out, please use your home chemo spill kit to clean up the spill. Do NOT use paper towels or other household products.  If you have problems or questions regarding your pump, please call either [phone] (24 hours a day) or the cancer center Monday-Friday 8:00 a.m.- 4:30 p.m. at the clinic number and we will assist you. If you are unable to get assistance, then go to the nearest Emergency Department and ask the staff to contact the IV team for assistance.  Discharge Instructions: Thank you for choosing [facility] to provide your oncology and hematology care.   If you have a lab appointment with the [facility], please go directly to the [facility] and check in at the registration area.   Wear comfortable clothing and clothing appropriate for easy access to any Portacath or PICC line.   We strive to give you quality time with your provider. You may need to reschedule your appointment if you arrive late (15 or more minutes).  Arriving late affects you and other patients whose appointments are after yours.  Also, if you miss three or more appointments without notifying the office, you may be dismissed from the clinic at the providers discretion.      For prescription  refill requests, have your pharmacy contact our office and allow 72 hours for refills to be completed.    Today you received the following chemotherapy and/or immunotherapy agents Oxaliplatin, leucovorin, fluorouracil      To help prevent nausea and vomiting after your treatment, we encourage you to take your nausea medication as directed.  BELOW ARE SYMPTOMS THAT SHOULD BE REPORTED IMMEDIATELY: *FEVER GREATER THAN 100.4 F (38 C) OR HIGHER *CHILLS OR SWEATING *NAUSEA AND VOMITING THAT IS NOT CONTROLLED WITH YOUR NAUSEA MEDICATION *UNUSUAL SHORTNESS OF BREATH *UNUSUAL BRUISING OR BLEEDING *URINARY PROBLEMS (pain or burning when urinating, or frequent urination) *BOWEL PROBLEMS (unusual diarrhea, constipation, pain near the anus) TENDERNESS IN MOUTH AND THROAT WITH OR WITHOUT PRESENCE OF ULCERS (sore throat, sores in mouth, or a toothache) UNUSUAL RASH, SWELLING OR PAIN  UNUSUAL VAGINAL DISCHARGE OR ITCHING   Items with * indicate a potential emergency and should be followed up as soon as possible or go to the Emergency Department if any problems should occur.  Please show the CHEMOTHERAPY ALERT CARD or IMMUNOTHERAPY ALERT CARD at check-in to the Emergency Department and triage nurse.  Should you have questions after your visit or need to cancel or reschedule your appointment, please contact [facility]  Dept: [phone]  and follow the prompts.  Office hours are 8:00 a.m. to 4:30 p.m. Monday - Friday. Please note that voicemails left  after 4:00 p.m. may not be returned until the following business day.  We are closed weekends and major holidays. You have access to a nurse at all times for urgent questions. Please call the main number to the clinic Dept: [phone] and follow the prompts.   For any non-urgent questions, you may also contact your provider using MyChart. We now offer e-Visits for anyone 59 and older to request care online for non-urgent symptoms.  For details visit [email or web address].   Also download the MyChart app! Go to the app store, search "MyChart", open the app, select [facility], and log in with your MyChart username and password.  Due to Covid, a mask is required upon entering the hospital/clinic. If you do not have a mask, one will be given to you upon arrival. For doctor visits, patients may have 1 support person aged 51 or older with them. For treatment visits, patients cannot have anyone with them due to current Covid guidelines and our immunocompromised population.   Oxaliplatin Injection What is this medication? OXALIPLATIN (ox AL i PLA tin) is a chemotherapy drug. It targets fast dividing cells, like cancer cells, and causes these cells to die. This medicine is used to treat cancers of the colon and rectum, and many other cancers. This medicine may be used for other purposes; ask your health care provider or pharmacist if you have questions. COMMON BRAND NAME(S): Eloxatin What should I tell my care team before I take this medication? They need to know if you have any of these conditions: heart disease history of irregular heartbeat liver disease low blood counts, like white cells, platelets, or red blood cells lung or breathing disease, like asthma take medicines that treat or prevent blood clots tingling of the fingers or toes, or other nerve disorder an unusual or allergic reaction to oxaliplatin, other chemotherapy, other medicines, foods, dyes, or preservatives pregnant or trying to get pregnant breast-feeding How should I use this medication? This drug is given as an infusion into a vein. It is administered in a hospital or clinic by a specially trained health care professional. Talk to your pediatrician regarding the use of this medicine in children. Special care may be needed. Overdosage: If you think you have taken too much of this medicine contact a poison control center or emergency room at once. NOTE:  This medicine is only for you. Do not share this medicine with others. What if I miss a dose? It is important not to miss a dose. Call your doctor or health care professional if you are unable to keep an appointment. What may interact with this medication? Do not take this medicine with any of the following medications: cisapride dronedarone pimozide thioridazine This medicine may also interact with the following medications: aspirin and aspirin-like medicines certain medicines that treat or prevent blood clots like warfarin, apixaban, dabigatran, and rivaroxaban cisplatin cyclosporine diuretics medicines for infection like acyclovir, adefovir, amphotericin B, bacitracin, cidofovir, foscarnet, ganciclovir, gentamicin, pentamidine, vancomycin NSAIDs, medicines for pain and inflammation, like ibuprofen or naproxen other medicines that prolong the QT interval (an abnormal heart rhythm) pamidronate zoledronic acid This list may not describe all possible interactions. Give your health care provider a list of all the medicines, herbs, non-prescription drugs, or dietary supplements you use. Also tell them if you smoke, drink alcohol, or use illegal drugs. Some items may interact with your medicine. What should I watch for while using this medication? Your condition will be monitored carefully while you are receiving this medicine.  You may need blood work done while you are taking this medicine. This medicine may make you feel generally unwell. This is not uncommon as chemotherapy can affect healthy cells as well as cancer cells. Report any side effects. Continue your course of treatment even though you feel ill unless your healthcare professional tells you to stop. This medicine can make you more sensitive to cold. Do not drink cold drinks or use ice. Cover exposed skin before coming in contact with cold temperatures or cold objects. When out in cold weather wear warm clothing and cover your mouth  and nose to warm the air that goes into your lungs. Tell your doctor if you get sensitive to the cold. Do not become pregnant while taking this medicine or for 9 months after stopping it. Women should inform their health care professional if they wish to become pregnant or think they might be pregnant. Men should not father a child while taking this medicine and for 6 months after stopping it. There is potential for serious side effects to an unborn child. Talk to your health care professional for more information. Do not breast-feed a child while taking this medicine or for 3 months after stopping it. This medicine has caused ovarian failure in some women. This medicine may make it more difficult to get pregnant. Talk to your health care professional if you are concerned about your fertility. This medicine has caused decreased sperm counts in some men. This may make it more difficult to father a child. Talk to your health care professional if you are concerned about your fertility. This medicine may increase your risk of getting an infection. Call your health care professional for advice if you get a fever, chills, or sore throat, or other symptoms of a cold or flu. Do not treat yourself. Try to avoid being around people who are sick. Avoid taking medicines that contain aspirin, acetaminophen, ibuprofen, naproxen, or ketoprofen unless instructed by your health care professional. These medicines may hide a fever. Be careful brushing or flossing your teeth or using a toothpick because you may get an infection or bleed more easily. If you have any dental work done, tell your dentist you are receiving this medicine. What side effects may I notice from receiving this medication? Side effects that you should report to your doctor or health care professional as soon as possible: allergic reactions like skin rash, itching or hives, swelling of the face, lips, or tongue breathing problems cough low blood counts  - this medicine may decrease the number of white blood cells, red blood cells, and platelets. You may be at increased risk for infections and bleeding nausea, vomiting pain, redness, or irritation at site where injected pain, tingling, numbness in the hands or feet signs and symptoms of bleeding such as bloody or black, tarry stools; red or dark brown urine; spitting up blood or brown material that looks like coffee grounds; red spots on the skin; unusual bruising or bleeding from the eyes, gums, or nose signs and symptoms of a dangerous change in heartbeat or heart rhythm like chest pain; dizziness; fast, irregular heartbeat; palpitations; feeling faint or lightheaded; falls signs and symptoms of infection like fever; chills; cough; sore throat; pain or trouble passing urine signs and symptoms of liver injury like dark yellow or brown urine; general ill feeling or flu-like symptoms; light-colored stools; loss of appetite; nausea; right upper belly pain; unusually weak or tired; yellowing of the eyes or skin signs and symptoms of low  red blood cells or anemia such as unusually weak or tired; feeling faint or lightheaded; falls signs and symptoms of muscle injury like dark urine; trouble passing urine or change in the amount of urine; unusually weak or tired; muscle pain; back pain Side effects that usually do not require medical attention (report to your doctor or health care professional if they continue or are bothersome): changes in taste diarrhea gas hair loss loss of appetite mouth sores This list may not describe all possible side effects. Call your doctor for medical advice about side effects. You may report side effects to FDA at 1-800-FDA-1088. Where should I keep my medication? This drug is given in a hospital or clinic and will not be stored at home. NOTE: This sheet is a summary. It may not cover all possible information. If you have questions about this medicine, talk to your doctor,  pharmacist, or health care provider.  2022 Elsevier/Gold Standard (2021-06-17 00:00:00)  Leucovorin injection What is this medication? LEUCOVORIN (loo koe VOR in) is used to prevent or treat the harmful effects of some medicines. This medicine is used to treat anemia caused by a low amount of folic acid in the body. It is also used with 5-fluorouracil (5-FU) to treat colon cancer. This medicine may be used for other purposes; ask your health care provider or pharmacist if you have questions. What should I tell my care team before I take this medication? They need to know if you have any of these conditions: anemia from low levels of vitamin B-12 in the blood an unusual or allergic reaction to leucovorin, folic acid, other medicines, foods, dyes, or preservatives pregnant or trying to get pregnant breast-feeding How should I use this medication? This medicine is for injection into a muscle or into a vein. It is given by a health care professional in a hospital or clinic setting. Talk to your pediatrician regarding the use of this medicine in children. Special care may be needed. Overdosage: If you think you have taken too much of this medicine contact a poison control center or emergency room at once. NOTE: This medicine is only for you. Do not share this medicine with others. What if I miss a dose? This does not apply. What may interact with this medication? capecitabine fluorouracil phenobarbital phenytoin primidone trimethoprim-sulfamethoxazole This list may not describe all possible interactions. Give your health care provider a list of all the medicines, herbs, non-prescription drugs, or dietary supplements you use. Also tell them if you smoke, drink alcohol, or use illegal drugs. Some items may interact with your medicine. What should I watch for while using this medication? Your condition will be monitored carefully while you are receiving this medicine. This medicine may  increase the side effects of 5-fluorouracil, 5-FU. Tell your doctor or health care professional if you have diarrhea or mouth sores that do not get better or that get worse. What side effects may I notice from receiving this medication? Side effects that you should report to your doctor or health care professional as soon as possible: allergic reactions like skin rash, itching or hives, swelling of the face, lips, or tongue breathing problems fever, infection mouth sores unusual bleeding or bruising unusually weak or tired Side effects that usually do not require medical attention (report to your doctor or health care professional if they continue or are bothersome): constipation or diarrhea loss of appetite nausea, vomiting This list may not describe all possible side effects. Call your doctor for medical advice  about side effects. You may report side effects to FDA at 1-800-FDA-1088. Where should I keep my medication? This drug is given in a hospital or clinic and will not be stored at home. NOTE: This sheet is a summary. It may not cover all possible information. If you have questions about this medicine, talk to your doctor, pharmacist, or health care provider.  2022 Elsevier/Gold Standard (2008-04-05 00:00:00)  Fluorouracil, 5-FU injection What is this medication? FLUOROURACIL, 5-FU (flure oh YOOR a sil) is a chemotherapy drug. It slows the growth of cancer cells. This medicine is used to treat many types of cancer like breast cancer, colon or rectal cancer, pancreatic cancer, and stomach cancer. This medicine may be used for other purposes; ask your health care provider or pharmacist if you have questions. COMMON BRAND NAME(S): Adrucil What should I tell my care team before I take this medication? They need to know if you have any of these conditions: blood disorders dihydropyrimidine dehydrogenase (DPD) deficiency infection (especially a virus infection such as chickenpox, cold  sores, or herpes) kidney disease liver disease malnourished, poor nutrition recent or ongoing radiation therapy an unusual or allergic reaction to fluorouracil, other chemotherapy, other medicines, foods, dyes, or preservatives pregnant or trying to get pregnant breast-feeding How should I use this medication? This drug is given as an infusion or injection into a vein. It is administered in a hospital or clinic by a specially trained health care professional. Talk to your pediatrician regarding the use of this medicine in children. Special care may be needed. Overdosage: If you think you have taken too much of this medicine contact a poison control center or emergency room at once. NOTE: This medicine is only for you. Do not share this medicine with others. What if I miss a dose? It is important not to miss your dose. Call your doctor or health care professional if you are unable to keep an appointment. What may interact with this medication? Do not take this medicine with any of the following medications: live virus vaccines This medicine may also interact with the following medications: medicines that treat or prevent blood clots like warfarin, enoxaparin, and dalteparin This list may not describe all possible interactions. Give your health care provider a list of all the medicines, herbs, non-prescription drugs, or dietary supplements you use. Also tell them if you smoke, drink alcohol, or use illegal drugs. Some items may interact with your medicine. What should I watch for while using this medication? Visit your doctor for checks on your progress. This drug may make you feel generally unwell. This is not uncommon, as chemotherapy can affect healthy cells as well as cancer cells. Report any side effects. Continue your course of treatment even though you feel ill unless your doctor tells you to stop. In some cases, you may be given additional medicines to help with side effects. Follow all  directions for their use. Call your doctor or health care professional for advice if you get a fever, chills or sore throat, or other symptoms of a cold or flu. Do not treat yourself. This drug decreases your body's ability to fight infections. Try to avoid being around people who are sick. This medicine may increase your risk to bruise or bleed. Call your doctor or health care professional if you notice any unusual bleeding. Be careful brushing and flossing your teeth or using a toothpick because you may get an infection or bleed more easily. If you have any dental work done, tell  your dentist you are receiving this medicine. Avoid taking products that contain aspirin, acetaminophen, ibuprofen, naproxen, or ketoprofen unless instructed by your doctor. These medicines may hide a fever. Do not become pregnant while taking this medicine. Women should inform their doctor if they wish to become pregnant or think they might be pregnant. There is a potential for serious side effects to an unborn child. Talk to your health care professional or pharmacist for more information. Do not breast-feed an infant while taking this medicine. Men should inform their doctor if they wish to father a child. This medicine may lower sperm counts. Do not treat diarrhea with over the counter products. Contact your doctor if you have diarrhea that lasts more than 2 days or if it is severe and watery. This medicine can make you more sensitive to the sun. Keep out of the sun. If you cannot avoid being in the sun, wear protective clothing and use sunscreen. Do not use sun lamps or tanning beds/booths. What side effects may I notice from receiving this medication? Side effects that you should report to your doctor or health care professional as soon as possible: allergic reactions like skin rash, itching or hives, swelling of the face, lips, or tongue low blood counts - this medicine may decrease the number of white blood cells, red  blood cells and platelets. You may be at increased risk for infections and bleeding. signs of infection - fever or chills, cough, sore throat, pain or difficulty passing urine signs of decreased platelets or bleeding - bruising, pinpoint red spots on the skin, black, tarry stools, blood in the urine signs of decreased red blood cells - unusually weak or tired, fainting spells, lightheadedness breathing problems changes in vision chest pain mouth sores nausea and vomiting pain, swelling, redness at site where injected pain, tingling, numbness in the hands or feet redness, swelling, or sores on hands or feet stomach pain unusual bleeding Side effects that usually do not require medical attention (report to your doctor or health care professional if they continue or are bothersome): changes in finger or toe nails diarrhea dry or itchy skin hair loss headache loss of appetite sensitivity of eyes to the light stomach upset unusually teary eyes This list may not describe all possible side effects. Call your doctor for medical advice about side effects. You may report side effects to FDA at 1-800-FDA-1088. Where should I keep my medication? This drug is given in a hospital or clinic and will not be stored at home. NOTE: This sheet is a summary. It may not cover all possible information. If you have questions about this medicine, talk to your doctor, pharmacist, or health care provider.  2022 Elsevier/Gold Standard (2021-06-17 00:00:00)

## 2021-10-29 NOTE — Progress Notes (Signed)
[facility] OFFICE PROGRESS NOTE   Diagnosis: Colon cancer  INTERVAL HISTORY:   Kelsey Bridges completed another cycle of FOLFOX on 10/16/2021.  She reports malaise for several days following chemotherapy.  She has cold sensitivity.  Frequent bowel movements.  She had numbness in the fingers after getting out of the bath yesterday.  Objective:  Vital signs in last 24 hours:  Blood pressure 118/84, pulse 68, temperature 98.1 F (36.7 C), temperature source Oral, resp. rate 20, height _0  (1.549 m), weight 117 lb 9.6 oz (53.3 kg), SpO2 100 %.    HEENT: Mild hyperpigmentation of the buccal mucosa, no thrush or ulcers Resp: Lungs clear bilaterally Cardio: Irregular GI: No hepatosplenomegaly, nontender Vascular: No leg edema Neuro: Mild loss of vibratory sense at the fingertips bilaterally Skin: Hyperpigmentation of the hands  Portacath/PICC-without erythema  Lab Results:  Lab Results  Component Value Date   WBC 4.7 10/29/2021   HGB 9.6 (L) 10/29/2021   HCT 30.9 (L) 10/29/2021   MCV 84.4 10/29/2021   PLT 84 (L) 10/29/2021   NEUTROABS 2.7 10/29/2021    CMP  Lab Results  Component Value Date   NA 139 10/29/2021   K 3.6 10/29/2021   CL 106 10/29/2021   CO2 23 10/29/2021   GLUCOSE 96 10/29/2021   BUN 19 10/29/2021   CREATININE 0.89 10/29/2021   CALCIUM 9.8 10/29/2021   PROT 6.5 10/29/2021   ALBUMIN 3.8 10/29/2021   AST 33 10/29/2021   ALT 24 10/29/2021   ALKPHOS 66 10/29/2021   BILITOT 0.6 10/29/2021   GFRNONAA >60 10/29/2021   GFRAA 84 06/28/2020    Lab Results  Component Value Date   CEA 3.28 09/17/2021    Lab Results  Component Value Date   INR 1.4 (H) 07/07/2021   LABPROT 17.1 (H) 07/07/2021    Imaging:  No results found.  Medications: I have reviewed the patient's current medications.   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 0/12 lymph nodes, 2 tumor deposits,  no lymphovascular perineural invasion MSI-high, loss of MSH2 and MSH6 expression Seen by genetics, testing completed, no pathogenic mutations (see note 08/22/2021) Colonoscopy 04/03/2021-partially obstructing descending colon mass, adenocarcinoma CTs 04/18/2021-descending colon mass, no evidence of metastatic disease, emphysema Elevated preoperative CEA, 04/03/2021 08/19/2021 repeat CEA 10 Cycle 1 FOLFOX 09/03/2021 Cycle 2 FOLFOX 09/17/2021 09/17/2021 repeat CEA 3.28 Cycle 3 FOLFOX 10/01/2021 Cycle 4 FOLFOX 10/16/2021 Cycle 5 FOLFOX 10/29/2021, oxaliplatin dose reduced   Atrial fibrillation Tobacco use 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 pylori June 2022 Hypertension     Disposition: Kelsey Bridges has completed 4 cycles of FOLFOX.  She has tolerated the chemotherapy well.  She has mild thrombocytopenia today.  The oxaliplatin will be dose reduced with chemotherapy today.  She will complete cycle 5 FOLFOX today. Kelsey Bridges will return for an office visit and the final planned cycle of FOLFOX in 2 weeks.  Betsy Coder, MD  10/29/2021  10:06 AM

## 2021-10-29 NOTE — Progress Notes (Signed)
Patient presents for treatment. RN assessment completed along with the following:  Labs/vitals reviewed - Yes, and Per Dr. Benay Spice, ok to treat with platelets 84.    Weight within 10% of previous measurement - Yes Informed consent completed and reflects current therapy/intent - Yes, on date 09/03/21             Provider progress note reviewed - Yes, today's provider note was reviewed. Treatment/Antibody/Supportive plan reviewed - Yes, and Oxaliplatin to be dose reduced.  S&H and other orders reviewed - Yes, and there are no additional orders identified. Previous treatment date reviewed - Yes, and the appropriate amount of time has elapsed between treatments. Clinic Hand Off Received from - Merceda Elks, RN  Patient to proceed with treatment.

## 2021-10-29 NOTE — Addendum Note (Signed)
Addended by: Betsy Coder B on: 10/29/2021 10:11 AM   Modules accepted: Level of Service

## 2021-10-31 ENCOUNTER — Other Ambulatory Visit: Payer: Self-pay

## 2021-10-31 ENCOUNTER — Inpatient Hospital Stay: Payer: Medicare HMO

## 2021-10-31 VITALS — BP 122/80 | HR 102 | Temp 98.8°F | Resp 20

## 2021-10-31 DIAGNOSIS — Z5111 Encounter for antineoplastic chemotherapy: Secondary | ICD-10-CM | POA: Diagnosis not present

## 2021-10-31 DIAGNOSIS — C186 Malignant neoplasm of descending colon: Secondary | ICD-10-CM

## 2021-10-31 MED ORDER — HEPARIN SOD (PORK) LOCK FLUSH 100 UNIT/ML IV SOLN
500.0000 [IU] | Freq: Once | INTRAVENOUS | Status: AC | PRN
  Administered 2021-10-31: 500 [IU]

## 2021-10-31 MED ORDER — SODIUM CHLORIDE 0.9% FLUSH
10.0000 mL | INTRAVENOUS | Status: DC | PRN
  Administered 2021-10-31: 10 mL

## 2021-10-31 NOTE — Patient Instructions (Signed)

## 2021-11-09 ENCOUNTER — Other Ambulatory Visit: Payer: Self-pay | Admitting: Oncology

## 2021-11-12 ENCOUNTER — Inpatient Hospital Stay: Payer: Medicare HMO | Attending: Oncology

## 2021-11-12 ENCOUNTER — Encounter: Payer: Self-pay | Admitting: *Deleted

## 2021-11-12 ENCOUNTER — Inpatient Hospital Stay: Payer: Medicare HMO | Admitting: Nutrition

## 2021-11-12 ENCOUNTER — Inpatient Hospital Stay (HOSPITAL_BASED_OUTPATIENT_CLINIC_OR_DEPARTMENT_OTHER): Payer: Medicare HMO | Admitting: Oncology

## 2021-11-12 ENCOUNTER — Inpatient Hospital Stay: Payer: Medicare HMO

## 2021-11-12 ENCOUNTER — Other Ambulatory Visit: Payer: Self-pay

## 2021-11-12 VITALS — BP 143/78 | HR 82 | Resp 18

## 2021-11-12 VITALS — BP 140/90 | HR 94 | Temp 98.7°F | Resp 20 | Ht 61.0 in | Wt 118.0 lb

## 2021-11-12 DIAGNOSIS — R635 Abnormal weight gain: Secondary | ICD-10-CM | POA: Diagnosis not present

## 2021-11-12 DIAGNOSIS — I4891 Unspecified atrial fibrillation: Secondary | ICD-10-CM | POA: Diagnosis not present

## 2021-11-12 DIAGNOSIS — Z5111 Encounter for antineoplastic chemotherapy: Secondary | ICD-10-CM | POA: Insufficient documentation

## 2021-11-12 DIAGNOSIS — C186 Malignant neoplasm of descending colon: Secondary | ICD-10-CM | POA: Diagnosis present

## 2021-11-12 DIAGNOSIS — Z79899 Other long term (current) drug therapy: Secondary | ICD-10-CM | POA: Insufficient documentation

## 2021-11-12 DIAGNOSIS — Z9049 Acquired absence of other specified parts of digestive tract: Secondary | ICD-10-CM | POA: Insufficient documentation

## 2021-11-12 DIAGNOSIS — J449 Chronic obstructive pulmonary disease, unspecified: Secondary | ICD-10-CM | POA: Insufficient documentation

## 2021-11-12 DIAGNOSIS — I10 Essential (primary) hypertension: Secondary | ICD-10-CM | POA: Diagnosis not present

## 2021-11-12 LAB — CBC WITH DIFFERENTIAL (CANCER CENTER ONLY)
Abs Immature Granulocytes: 0.01 10*3/uL (ref 0.00–0.07)
Basophils Absolute: 0 10*3/uL (ref 0.0–0.1)
Basophils Relative: 1 %
Eosinophils Absolute: 0 10*3/uL (ref 0.0–0.5)
Eosinophils Relative: 1 %
HCT: 30.3 % — ABNORMAL LOW (ref 36.0–46.0)
Hemoglobin: 9.6 g/dL — ABNORMAL LOW (ref 12.0–15.0)
Immature Granulocytes: 0 %
Lymphocytes Relative: 32 %
Lymphs Abs: 1 10*3/uL (ref 0.7–4.0)
MCH: 26.7 pg (ref 26.0–34.0)
MCHC: 31.7 g/dL (ref 30.0–36.0)
MCV: 84.2 fL (ref 80.0–100.0)
Monocytes Absolute: 0.4 10*3/uL (ref 0.1–1.0)
Monocytes Relative: 14 %
Neutro Abs: 1.7 10*3/uL (ref 1.7–7.7)
Neutrophils Relative %: 52 %
Platelet Count: 96 10*3/uL — ABNORMAL LOW (ref 150–400)
RBC: 3.6 MIL/uL — ABNORMAL LOW (ref 3.87–5.11)
RDW: 22.8 % — ABNORMAL HIGH (ref 11.5–15.5)
WBC Count: 3.2 10*3/uL — ABNORMAL LOW (ref 4.0–10.5)
nRBC: 0 % (ref 0.0–0.2)

## 2021-11-12 LAB — CMP (CANCER CENTER ONLY)
ALT: 18 U/L (ref 0–44)
AST: 35 U/L (ref 15–41)
Albumin: 3.8 g/dL (ref 3.5–5.0)
Alkaline Phosphatase: 86 U/L (ref 38–126)
Anion gap: 10 (ref 5–15)
BUN: 12 mg/dL (ref 8–23)
CO2: 24 mmol/L (ref 22–32)
Calcium: 9.5 mg/dL (ref 8.9–10.3)
Chloride: 107 mmol/L (ref 98–111)
Creatinine: 0.84 mg/dL (ref 0.44–1.00)
GFR, Estimated: >60 mL/min (ref >60)
Glucose, Bld: 120 mg/dL — ABNORMAL HIGH (ref 70–99)
Potassium: 3.8 mmol/L (ref 3.5–5.1)
Sodium: 141 mmol/L (ref 135–145)
Total Bilirubin: 0.9 mg/dL (ref 0.3–1.2)
Total Protein: 6.9 g/dL (ref 6.5–8.1)

## 2021-11-12 MED ORDER — LEUCOVORIN CALCIUM INJECTION 350 MG
400.0000 mg/m2 | Freq: Once | INTRAVENOUS | Status: AC
  Administered 2021-11-12: 604 mg via INTRAVENOUS
  Filled 2021-11-12: qty 30.2

## 2021-11-12 MED ORDER — PALONOSETRON HCL INJECTION 0.25 MG/5ML
0.2500 mg | Freq: Once | INTRAVENOUS | Status: AC
  Administered 2021-11-12: 0.25 mg via INTRAVENOUS
  Filled 2021-11-12: qty 5

## 2021-11-12 MED ORDER — OXALIPLATIN CHEMO INJECTION 100 MG/20ML
65.0000 mg/m2 | Freq: Once | INTRAVENOUS | Status: AC
  Administered 2021-11-12: 100 mg via INTRAVENOUS
  Filled 2021-11-12: qty 20

## 2021-11-12 MED ORDER — SODIUM CHLORIDE 0.9 % IV SOLN
2400.0000 mg/m2 | INTRAVENOUS | Status: DC
  Administered 2021-11-12: 3600 mg via INTRAVENOUS
  Filled 2021-11-12: qty 72

## 2021-11-12 MED ORDER — FLUOROURACIL CHEMO INJECTION 2.5 GM/50ML
400.0000 mg/m2 | Freq: Once | INTRAVENOUS | Status: AC
  Administered 2021-11-12: 600 mg via INTRAVENOUS
  Filled 2021-11-12: qty 12

## 2021-11-12 MED ORDER — DEXTROSE 5 % IV SOLN: Freq: Once | INTRAVENOUS | Status: AC

## 2021-11-12 MED ORDER — SODIUM CHLORIDE 0.9 % IV SOLN
10.0000 mg | Freq: Once | INTRAVENOUS | Status: AC
  Administered 2021-11-12: 10 mg via INTRAVENOUS
  Filled 2021-11-12: qty 1

## 2021-11-12 NOTE — Progress Notes (Signed)
Patient presents for treatment. RN assessment completed along with the following:  Labs/vitals reviewed - Yes, and Per Dr. Benay Spice, ok to treat with platelets 96.     Weight within 10% of previous measurement - Yes Informed consent completed and reflects current therapy/intent - Yes, on date 09/03/21             Provider progress note reviewed - Yes, today's provider note was reviewed. Treatment/Antibody/Supportive plan reviewed - Yes, and there are no adjustments needed for today's treatment. S&H and other orders reviewed - Yes, and there are no additional orders identified. Previous treatment date reviewed - Yes, and the appropriate amount of time has elapsed between treatments. Clinic Hand Off Received from - Merceda Elks, RN  Patient to proceed with treatment.

## 2021-11-12 NOTE — Patient Instructions (Addendum)
[facility]  The chemotherapy medication bag should finish at 46 hours, 96 hours, or 7 days. For example, if your pump is scheduled for 46 hours and it was put on at 4:00 p.m., it should finish at 2:00 p.m. the day it is scheduled to come off regardless of your appointment time.     Estimated time to finish at 11:15am Friday, February 3rd 2023.   If the display on your pump reads "Low Volume" and it is beeping, take the batteries out of the pump and come to the cancer center for it to be taken off.   If the pump alarms go off prior to the pump reading "Low Volume" then call [phone] and someone can assist you.  If the plunger comes out and the chemotherapy medication is leaking out, please use your home chemo spill kit to clean up the spill. Do NOT use paper towels or other household products.  If you have problems or questions regarding your pump, please call either [phone] (24 hours a day) or the cancer center Monday-Friday 8:00 a.m.- 4:30 p.m. at the clinic number and we will assist you. If you are unable to get assistance, then go to the nearest Emergency Department and ask the staff to contact the IV team for assistance.  Discharge Instructions: Thank you for choosing [facility] to provide your oncology and hematology care.   If you have a lab appointment with the [facility], please go directly to the [facility] and check in at the registration area.   Wear comfortable clothing and clothing appropriate for easy access to any Portacath or PICC line.   We strive to give you quality time with your provider. You may need to reschedule your appointment if you arrive late (15 or more minutes).  Arriving late affects you and other patients whose appointments are after yours.  Also, if you miss three or more appointments without notifying the office, you may be dismissed from the clinic at the providers discretion.      For prescription  refill requests, have your pharmacy contact our office and allow 72 hours for refills to be completed.    Today you received the following chemotherapy and/or immunotherapy agents Oxaliplatin, leucovorin, fluorouracil      To help prevent nausea and vomiting after your treatment, we encourage you to take your nausea medication as directed.  BELOW ARE SYMPTOMS THAT SHOULD BE REPORTED IMMEDIATELY: *FEVER GREATER THAN 100.4 F (38 C) OR HIGHER *CHILLS OR SWEATING *NAUSEA AND VOMITING THAT IS NOT CONTROLLED WITH YOUR NAUSEA MEDICATION *UNUSUAL SHORTNESS OF BREATH *UNUSUAL BRUISING OR BLEEDING *URINARY PROBLEMS (pain or burning when urinating, or frequent urination) *BOWEL PROBLEMS (unusual diarrhea, constipation, pain near the anus) TENDERNESS IN MOUTH AND THROAT WITH OR WITHOUT PRESENCE OF ULCERS (sore throat, sores in mouth, or a toothache) UNUSUAL RASH, SWELLING OR PAIN  UNUSUAL VAGINAL DISCHARGE OR ITCHING   Items with * indicate a potential emergency and should be followed up as soon as possible or go to the Emergency Department if any problems should occur.  Please show the CHEMOTHERAPY ALERT CARD or IMMUNOTHERAPY ALERT CARD at check-in to the Emergency Department and triage nurse.  Should you have questions after your visit or need to cancel or reschedule your appointment, please contact [facility]  Dept: [phone]  and follow the prompts.  Office hours are 8:00 a.m. to 4:30 p.m. Monday - Friday. Please note that voicemails left  after 4:00 p.m. may not be returned until the following business day.  We are closed weekends and major holidays. You have access to a nurse at all times for urgent questions. Please call the main number to the clinic Dept: [phone] and follow the prompts.   For any non-urgent questions, you may also contact your provider using MyChart. We now offer e-Visits for anyone 59 and older to request care online for non-urgent symptoms.  For details visit [email or web address].   Also download the MyChart app! Go to the app store, search "MyChart", open the app, select [facility], and log in with your MyChart username and password.  Due to Covid, a mask is required upon entering the hospital/clinic. If you do not have a mask, one will be given to you upon arrival. For doctor visits, patients may have 1 support person aged 51 or older with them. For treatment visits, patients cannot have anyone with them due to current Covid guidelines and our immunocompromised population.   Oxaliplatin Injection What is this medication? OXALIPLATIN (ox AL i PLA tin) is a chemotherapy drug. It targets fast dividing cells, like cancer cells, and causes these cells to die. This medicine is used to treat cancers of the colon and rectum, and many other cancers. This medicine may be used for other purposes; ask your health care provider or pharmacist if you have questions. COMMON BRAND NAME(S): Eloxatin What should I tell my care team before I take this medication? They need to know if you have any of these conditions: heart disease history of irregular heartbeat liver disease low blood counts, like white cells, platelets, or red blood cells lung or breathing disease, like asthma take medicines that treat or prevent blood clots tingling of the fingers or toes, or other nerve disorder an unusual or allergic reaction to oxaliplatin, other chemotherapy, other medicines, foods, dyes, or preservatives pregnant or trying to get pregnant breast-feeding How should I use this medication? This drug is given as an infusion into a vein. It is administered in a hospital or clinic by a specially trained health care professional. Talk to your pediatrician regarding the use of this medicine in children. Special care may be needed. Overdosage: If you think you have taken too much of this medicine contact a poison control center or emergency room at once. NOTE:  This medicine is only for you. Do not share this medicine with others. What if I miss a dose? It is important not to miss a dose. Call your doctor or health care professional if you are unable to keep an appointment. What may interact with this medication? Do not take this medicine with any of the following medications: cisapride dronedarone pimozide thioridazine This medicine may also interact with the following medications: aspirin and aspirin-like medicines certain medicines that treat or prevent blood clots like warfarin, apixaban, dabigatran, and rivaroxaban cisplatin cyclosporine diuretics medicines for infection like acyclovir, adefovir, amphotericin B, bacitracin, cidofovir, foscarnet, ganciclovir, gentamicin, pentamidine, vancomycin NSAIDs, medicines for pain and inflammation, like ibuprofen or naproxen other medicines that prolong the QT interval (an abnormal heart rhythm) pamidronate zoledronic acid This list may not describe all possible interactions. Give your health care provider a list of all the medicines, herbs, non-prescription drugs, or dietary supplements you use. Also tell them if you smoke, drink alcohol, or use illegal drugs. Some items may interact with your medicine. What should I watch for while using this medication? Your condition will be monitored carefully while you are receiving this medicine.  You may need blood work done while you are taking this medicine. This medicine may make you feel generally unwell. This is not uncommon as chemotherapy can affect healthy cells as well as cancer cells. Report any side effects. Continue your course of treatment even though you feel ill unless your healthcare professional tells you to stop. This medicine can make you more sensitive to cold. Do not drink cold drinks or use ice. Cover exposed skin before coming in contact with cold temperatures or cold objects. When out in cold weather wear warm clothing and cover your mouth  and nose to warm the air that goes into your lungs. Tell your doctor if you get sensitive to the cold. Do not become pregnant while taking this medicine or for 9 months after stopping it. Women should inform their health care professional if they wish to become pregnant or think they might be pregnant. Men should not father a child while taking this medicine and for 6 months after stopping it. There is potential for serious side effects to an unborn child. Talk to your health care professional for more information. Do not breast-feed a child while taking this medicine or for 3 months after stopping it. This medicine has caused ovarian failure in some women. This medicine may make it more difficult to get pregnant. Talk to your health care professional if you are concerned about your fertility. This medicine has caused decreased sperm counts in some men. This may make it more difficult to father a child. Talk to your health care professional if you are concerned about your fertility. This medicine may increase your risk of getting an infection. Call your health care professional for advice if you get a fever, chills, or sore throat, or other symptoms of a cold or flu. Do not treat yourself. Try to avoid being around people who are sick. Avoid taking medicines that contain aspirin, acetaminophen, ibuprofen, naproxen, or ketoprofen unless instructed by your health care professional. These medicines may hide a fever. Be careful brushing or flossing your teeth or using a toothpick because you may get an infection or bleed more easily. If you have any dental work done, tell your dentist you are receiving this medicine. What side effects may I notice from receiving this medication? Side effects that you should report to your doctor or health care professional as soon as possible: allergic reactions like skin rash, itching or hives, swelling of the face, lips, or tongue breathing problems cough low blood counts  - this medicine may decrease the number of white blood cells, red blood cells, and platelets. You may be at increased risk for infections and bleeding nausea, vomiting pain, redness, or irritation at site where injected pain, tingling, numbness in the hands or feet signs and symptoms of bleeding such as bloody or black, tarry stools; red or dark brown urine; spitting up blood or brown material that looks like coffee grounds; red spots on the skin; unusual bruising or bleeding from the eyes, gums, or nose signs and symptoms of a dangerous change in heartbeat or heart rhythm like chest pain; dizziness; fast, irregular heartbeat; palpitations; feeling faint or lightheaded; falls signs and symptoms of infection like fever; chills; cough; sore throat; pain or trouble passing urine signs and symptoms of liver injury like dark yellow or brown urine; general ill feeling or flu-like symptoms; light-colored stools; loss of appetite; nausea; right upper belly pain; unusually weak or tired; yellowing of the eyes or skin signs and symptoms of low  red blood cells or anemia such as unusually weak or tired; feeling faint or lightheaded; falls signs and symptoms of muscle injury like dark urine; trouble passing urine or change in the amount of urine; unusually weak or tired; muscle pain; back pain Side effects that usually do not require medical attention (report to your doctor or health care professional if they continue or are bothersome): changes in taste diarrhea gas hair loss loss of appetite mouth sores This list may not describe all possible side effects. Call your doctor for medical advice about side effects. You may report side effects to FDA at 1-800-FDA-1088. Where should I keep my medication? This drug is given in a hospital or clinic and will not be stored at home. NOTE: This sheet is a summary. It may not cover all possible information. If you have questions about this medicine, talk to your doctor,  pharmacist, or health care provider.  2022 Elsevier/Gold Standard (2021-06-17 00:00:00)  Leucovorin injection What is this medication? LEUCOVORIN (loo koe VOR in) is used to prevent or treat the harmful effects of some medicines. This medicine is used to treat anemia caused by a low amount of folic acid in the body. It is also used with 5-fluorouracil (5-FU) to treat colon cancer. This medicine may be used for other purposes; ask your health care provider or pharmacist if you have questions. What should I tell my care team before I take this medication? They need to know if you have any of these conditions: anemia from low levels of vitamin B-12 in the blood an unusual or allergic reaction to leucovorin, folic acid, other medicines, foods, dyes, or preservatives pregnant or trying to get pregnant breast-feeding How should I use this medication? This medicine is for injection into a muscle or into a vein. It is given by a health care professional in a hospital or clinic setting. Talk to your pediatrician regarding the use of this medicine in children. Special care may be needed. Overdosage: If you think you have taken too much of this medicine contact a poison control center or emergency room at once. NOTE: This medicine is only for you. Do not share this medicine with others. What if I miss a dose? This does not apply. What may interact with this medication? capecitabine fluorouracil phenobarbital phenytoin primidone trimethoprim-sulfamethoxazole This list may not describe all possible interactions. Give your health care provider a list of all the medicines, herbs, non-prescription drugs, or dietary supplements you use. Also tell them if you smoke, drink alcohol, or use illegal drugs. Some items may interact with your medicine. What should I watch for while using this medication? Your condition will be monitored carefully while you are receiving this medicine. This medicine may  increase the side effects of 5-fluorouracil, 5-FU. Tell your doctor or health care professional if you have diarrhea or mouth sores that do not get better or that get worse. What side effects may I notice from receiving this medication? Side effects that you should report to your doctor or health care professional as soon as possible: allergic reactions like skin rash, itching or hives, swelling of the face, lips, or tongue breathing problems fever, infection mouth sores unusual bleeding or bruising unusually weak or tired Side effects that usually do not require medical attention (report to your doctor or health care professional if they continue or are bothersome): constipation or diarrhea loss of appetite nausea, vomiting This list may not describe all possible side effects. Call your doctor for medical advice  about side effects. You may report side effects to FDA at 1-800-FDA-1088. Where should I keep my medication? This drug is given in a hospital or clinic and will not be stored at home. NOTE: This sheet is a summary. It may not cover all possible information. If you have questions about this medicine, talk to your doctor, pharmacist, or health care provider.  2022 Elsevier/Gold Standard (2008-04-05 00:00:00)  Fluorouracil, 5-FU injection What is this medication? FLUOROURACIL, 5-FU (flure oh YOOR a sil) is a chemotherapy drug. It slows the growth of cancer cells. This medicine is used to treat many types of cancer like breast cancer, colon or rectal cancer, pancreatic cancer, and stomach cancer. This medicine may be used for other purposes; ask your health care provider or pharmacist if you have questions. COMMON BRAND NAME(S): Adrucil What should I tell my care team before I take this medication? They need to know if you have any of these conditions: blood disorders dihydropyrimidine dehydrogenase (DPD) deficiency infection (especially a virus infection such as chickenpox, cold  sores, or herpes) kidney disease liver disease malnourished, poor nutrition recent or ongoing radiation therapy an unusual or allergic reaction to fluorouracil, other chemotherapy, other medicines, foods, dyes, or preservatives pregnant or trying to get pregnant breast-feeding How should I use this medication? This drug is given as an infusion or injection into a vein. It is administered in a hospital or clinic by a specially trained health care professional. Talk to your pediatrician regarding the use of this medicine in children. Special care may be needed. Overdosage: If you think you have taken too much of this medicine contact a poison control center or emergency room at once. NOTE: This medicine is only for you. Do not share this medicine with others. What if I miss a dose? It is important not to miss your dose. Call your doctor or health care professional if you are unable to keep an appointment. What may interact with this medication? Do not take this medicine with any of the following medications: live virus vaccines This medicine may also interact with the following medications: medicines that treat or prevent blood clots like warfarin, enoxaparin, and dalteparin This list may not describe all possible interactions. Give your health care provider a list of all the medicines, herbs, non-prescription drugs, or dietary supplements you use. Also tell them if you smoke, drink alcohol, or use illegal drugs. Some items may interact with your medicine. What should I watch for while using this medication? Visit your doctor for checks on your progress. This drug may make you feel generally unwell. This is not uncommon, as chemotherapy can affect healthy cells as well as cancer cells. Report any side effects. Continue your course of treatment even though you feel ill unless your doctor tells you to stop. In some cases, you may be given additional medicines to help with side effects. Follow all  directions for their use. Call your doctor or health care professional for advice if you get a fever, chills or sore throat, or other symptoms of a cold or flu. Do not treat yourself. This drug decreases your body's ability to fight infections. Try to avoid being around people who are sick. This medicine may increase your risk to bruise or bleed. Call your doctor or health care professional if you notice any unusual bleeding. Be careful brushing and flossing your teeth or using a toothpick because you may get an infection or bleed more easily. If you have any dental work done, tell  your dentist you are receiving this medicine. Avoid taking products that contain aspirin, acetaminophen, ibuprofen, naproxen, or ketoprofen unless instructed by your doctor. These medicines may hide a fever. Do not become pregnant while taking this medicine. Women should inform their doctor if they wish to become pregnant or think they might be pregnant. There is a potential for serious side effects to an unborn child. Talk to your health care professional or pharmacist for more information. Do not breast-feed an infant while taking this medicine. Men should inform their doctor if they wish to father a child. This medicine may lower sperm counts. Do not treat diarrhea with over the counter products. Contact your doctor if you have diarrhea that lasts more than 2 days or if it is severe and watery. This medicine can make you more sensitive to the sun. Keep out of the sun. If you cannot avoid being in the sun, wear protective clothing and use sunscreen. Do not use sun lamps or tanning beds/booths. What side effects may I notice from receiving this medication? Side effects that you should report to your doctor or health care professional as soon as possible: allergic reactions like skin rash, itching or hives, swelling of the face, lips, or tongue low blood counts - this medicine may decrease the number of white blood cells, red  blood cells and platelets. You may be at increased risk for infections and bleeding. signs of infection - fever or chills, cough, sore throat, pain or difficulty passing urine signs of decreased platelets or bleeding - bruising, pinpoint red spots on the skin, black, tarry stools, blood in the urine signs of decreased red blood cells - unusually weak or tired, fainting spells, lightheadedness breathing problems changes in vision chest pain mouth sores nausea and vomiting pain, swelling, redness at site where injected pain, tingling, numbness in the hands or feet redness, swelling, or sores on hands or feet stomach pain unusual bleeding Side effects that usually do not require medical attention (report to your doctor or health care professional if they continue or are bothersome): changes in finger or toe nails diarrhea dry or itchy skin hair loss headache loss of appetite sensitivity of eyes to the light stomach upset unusually teary eyes This list may not describe all possible side effects. Call your doctor for medical advice about side effects. You may report side effects to FDA at 1-800-FDA-1088. Where should I keep my medication? This drug is given in a hospital or clinic and will not be stored at home. NOTE: This sheet is a summary. It may not cover all possible information. If you have questions about this medicine, talk to your doctor, pharmacist, or health care provider.  2022 Elsevier/Gold Standard (2021-06-17 00:00:00)

## 2021-11-12 NOTE — Progress Notes (Signed)
[facility] OFFICE PROGRESS NOTE   Diagnosis: Colon cancer  INTERVAL HISTORY:   Kelsey Bridges complete another cycle of FOLFOX on 10/29/2021.  No nausea/vomiting, mouth sores, or diarrhea.  She has cold sensitivity for several days following chemotherapy.  No other neuropathy symptoms.  Objective:  Vital signs in last 24 hours:  Blood pressure 140/90, pulse 94, temperature 98.7 F (37.1 C), temperature source Oral, resp. rate 20, height $RemoveBe'5\' 1"'GjGpoYfyD$  (1.549 m), weight 118 lb (53.5 kg), SpO2 100 %.    HEENT: No thrush or ulcers, hyperpigmentation Resp: Lungs clear bilaterally Cardio: Regular rate and rhythm GI: No hepatosplenomegaly, nontender Vascular: No leg edema Neuro: Mild loss of vibratory sense at the fingertips bilaterally    Portacath/PICC-without erythema  Lab Results:  Lab Results  Component Value Date   WBC 3.2 (L) 11/12/2021   HGB 9.6 (L) 11/12/2021   HCT 30.3 (L) 11/12/2021   MCV 84.2 11/12/2021   PLT 96 (L) 11/12/2021   NEUTROABS 1.7 11/12/2021    CMP  Lab Results  Component Value Date   NA 141 11/12/2021   K 3.8 11/12/2021   CL 107 11/12/2021   CO2 24 11/12/2021   GLUCOSE 120 (H) 11/12/2021   BUN 12 11/12/2021   CREATININE 0.84 11/12/2021   CALCIUM 9.5 11/12/2021   PROT 6.9 11/12/2021   ALBUMIN 3.8 11/12/2021   AST 35 11/12/2021   ALT 18 11/12/2021   ALKPHOS 86 11/12/2021   BILITOT 0.9 11/12/2021   GFRNONAA >60 11/12/2021   GFRAA 84 06/28/2020    Lab Results  Component Value Date   CEA 3.78 10/29/2021     Medications: I have reviewed the patient's current medications.   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 0/12 lymph nodes, 2 tumor deposits, no lymphovascular perineural invasion MSI-high, loss of MSH2 and MSH6 expression Seen by genetics, testing completed, no pathogenic mutations (see note 08/22/2021) Colonoscopy 04/03/2021-partially obstructing  descending colon mass, adenocarcinoma CTs 04/18/2021-descending colon mass, no evidence of metastatic disease, emphysema Elevated preoperative CEA, 04/03/2021 08/19/2021 repeat CEA 10 Cycle 1 FOLFOX 09/03/2021 Cycle 2 FOLFOX 09/17/2021 09/17/2021 repeat CEA 3.28 Cycle 3 FOLFOX 10/01/2021 Cycle 4 FOLFOX 10/16/2021 Cycle 5 FOLFOX 10/29/2021, oxaliplatin dose reduced Cycle 6 FOLFOX 11/12/2021   Atrial fibrillation Tobacco use 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 pylori June 2022 Hypertension      Disposition: Kelsey Bridges has completed 5 cycles of adjuvant chemotherapy.  She has tolerated the chemotherapy well.  She will complete the final planned cycle of adjuvant chemotherapy today.  She will call for bleeding or a fever.  Kelsey Bridges will return for an office visit in 3 weeks.  Betsy Coder, MD  11/12/2021  9:26 AM

## 2021-11-12 NOTE — Progress Notes (Signed)
Patient seen by Dr. Benay Spice today  Vitals are within treatment parameters.  Labs reviewed by Dr. Benay Spice and are not all within treatment parameters. OK to treat w/platelet count 96,000  Per physician team, patient is ready for treatment and there are NO modifications to the treatment plan. MD aware of reported neuropathy in toes.

## 2021-11-12 NOTE — Progress Notes (Signed)
PATIENT NAVIGATOR PROGRESS NOTE  Name: Kelsey Bridges Date: 11/12/2021 MRN: [id number]  DOB: Aug 22, 1951   Reason for visit:  Final chemo treatment  Comments:  Met with Kelsey Bridges during last chemo treatment, overall has tolerated well, maintained weight and has good appetite. She will return to clinic in 3 weeks for F/U   Encouraged to call with any issues or questions     Time spent counseling/coordinating care: > 60 minutes

## 2021-11-12 NOTE — Patient Instructions (Signed)

## 2021-11-12 NOTE — Progress Notes (Signed)
Today is patient's last chemotherapy. She has maintained her weight and only reports fatigue and weakness. She has a good appetite and is eating well. I congratulated her on completion of chemotherapy and encouraged her to continue to consume adequate calories and protein frequently throughout the day. No follow up needed at this time.

## 2021-11-14 ENCOUNTER — Inpatient Hospital Stay: Payer: Medicare HMO

## 2021-11-14 ENCOUNTER — Other Ambulatory Visit: Payer: Self-pay

## 2021-11-14 VITALS — BP 142/99 | HR 81 | Temp 98.4°F | Resp 18

## 2021-11-14 DIAGNOSIS — Z5111 Encounter for antineoplastic chemotherapy: Secondary | ICD-10-CM | POA: Diagnosis not present

## 2021-11-14 DIAGNOSIS — C186 Malignant neoplasm of descending colon: Secondary | ICD-10-CM

## 2021-11-14 MED ORDER — HEPARIN SOD (PORK) LOCK FLUSH 100 UNIT/ML IV SOLN
500.0000 [IU] | Freq: Once | INTRAVENOUS | Status: AC | PRN
  Administered 2021-11-14: 500 [IU]

## 2021-11-14 MED ORDER — SODIUM CHLORIDE 0.9% FLUSH
10.0000 mL | INTRAVENOUS | Status: DC | PRN
  Administered 2021-11-14: 10 mL

## 2021-11-14 NOTE — Patient Instructions (Signed)

## 2021-12-04 ENCOUNTER — Other Ambulatory Visit: Payer: Self-pay

## 2021-12-04 ENCOUNTER — Inpatient Hospital Stay: Payer: Medicare HMO

## 2021-12-04 ENCOUNTER — Inpatient Hospital Stay (HOSPITAL_BASED_OUTPATIENT_CLINIC_OR_DEPARTMENT_OTHER): Payer: Medicare HMO | Admitting: Nurse Practitioner

## 2021-12-04 ENCOUNTER — Encounter: Payer: Self-pay | Admitting: Nurse Practitioner

## 2021-12-04 VITALS — BP 119/77 | HR 73 | Temp 97.8°F | Resp 20 | Ht 61.0 in | Wt 120.2 lb

## 2021-12-04 DIAGNOSIS — C186 Malignant neoplasm of descending colon: Secondary | ICD-10-CM

## 2021-12-04 DIAGNOSIS — Z5111 Encounter for antineoplastic chemotherapy: Secondary | ICD-10-CM | POA: Diagnosis not present

## 2021-12-04 LAB — CBC WITH DIFFERENTIAL (CANCER CENTER ONLY)
Abs Immature Granulocytes: 0.02 10*3/uL (ref 0.00–0.07)
Basophils Absolute: 0 10*3/uL (ref 0.0–0.1)
Basophils Relative: 1 %
Eosinophils Absolute: 0 10*3/uL (ref 0.0–0.5)
Eosinophils Relative: 0 %
HCT: 32.1 % — ABNORMAL LOW (ref 36.0–46.0)
Hemoglobin: 9.9 g/dL — ABNORMAL LOW (ref 12.0–15.0)
Immature Granulocytes: 0 %
Lymphocytes Relative: 33 %
Lymphs Abs: 1.5 10*3/uL (ref 0.7–4.0)
MCH: 27.8 pg (ref 26.0–34.0)
MCHC: 30.8 g/dL (ref 30.0–36.0)
MCV: 90.2 fL (ref 80.0–100.0)
Monocytes Absolute: 0.7 10*3/uL (ref 0.1–1.0)
Monocytes Relative: 16 %
Neutro Abs: 2.3 10*3/uL (ref 1.7–7.7)
Neutrophils Relative %: 50 %
Platelet Count: 198 10*3/uL (ref 150–400)
RBC: 3.56 MIL/uL — ABNORMAL LOW (ref 3.87–5.11)
RDW: 23.9 % — ABNORMAL HIGH (ref 11.5–15.5)
WBC Count: 4.5 10*3/uL (ref 4.0–10.5)
nRBC: 0 % (ref 0.0–0.2)

## 2021-12-04 LAB — CEA (ACCESS): CEA (CHCC): 3.22 ng/mL (ref 0.00–5.00)

## 2021-12-04 MED ORDER — SODIUM CHLORIDE 0.9% FLUSH
10.0000 mL | Freq: Once | INTRAVENOUS | Status: AC
  Administered 2021-12-04: 10 mL via INTRAVENOUS

## 2021-12-04 MED ORDER — HEPARIN SOD (PORK) LOCK FLUSH 100 UNIT/ML IV SOLN
500.0000 [IU] | Freq: Once | INTRAVENOUS | Status: AC
  Administered 2021-12-04: 500 [IU] via INTRAVENOUS

## 2021-12-04 NOTE — Progress Notes (Signed)
[facility] OFFICE PROGRESS NOTE   Diagnosis: Colon cancer  INTERVAL HISTORY:   Kelsey Bridges returns as scheduled.  She completed cycle 6 FOLFOX 11/12/2021.  She notes a numb sensation in the toes.  No gait disturbance.  No numbness or tingling in the hands.  She reports a good appetite.  She is gaining weight.  No nausea or vomiting.  Bowels moving regularly.  Objective:  Vital signs in last 24 hours:  Blood pressure 119/77, pulse 73, temperature 97.8 F (36.6 C), temperature source Oral, resp. rate 20, height 5' 1" (1.549 m), weight 120 lb 3.2 oz (54.5 kg), SpO2 100 %.    Lymphatics: No palpable cervical, supraclavicular, axillary or inguinal lymph nodes. Resp: Lungs clear bilaterally. Cardio: Regular rate and rhythm. GI: Abdomen soft and nontender.  No hepatosplenomegaly. Vascular: No leg edema. Port-A-Cath without erythema.  Lab Results:  Lab Results  Component Value Date   WBC 4.5 12/04/2021   HGB 9.9 (L) 12/04/2021   HCT 32.1 (L) 12/04/2021   MCV 90.2 12/04/2021   PLT 198 12/04/2021   NEUTROABS 2.3 12/04/2021    Imaging:  No results found.  Medications: I have reviewed the patient's current medications.  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 0/12 lymph nodes, 2 tumor deposits, no lymphovascular perineural invasion MSI-high, loss of MSH2 and MSH6 expression Seen by genetics, testing completed, no pathogenic mutations (see note 08/22/2021) Colonoscopy 04/03/2021-partially obstructing descending colon mass, adenocarcinoma CTs 04/18/2021-descending colon mass, no evidence of metastatic disease, emphysema Elevated preoperative CEA, 04/03/2021 08/19/2021 repeat CEA 10 Cycle 1 FOLFOX 09/03/2021 Cycle 2 FOLFOX 09/17/2021 09/17/2021 repeat CEA 3.28 Cycle 3 FOLFOX 10/01/2021 Cycle 4 FOLFOX 10/16/2021 Cycle 5 FOLFOX 10/29/2021, oxaliplatin dose reduced Cycle 6 FOLFOX 11/12/2021   Atrial  fibrillation Tobacco use 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 pylori June 2022 Hypertension  Disposition: Kelsey Bridges appears well.  She is now about 3 weeks out from completing a 74-monthcourse of FOLFOX.  She tolerated the chemotherapy well.  Plan for surveillance CT scans in about 6 weeks.  We will see her back a few days later to review the results.    LNed CardANP/GNP-BC   12/04/2021  1:42 PM

## 2021-12-05 ENCOUNTER — Telehealth: Payer: Self-pay | Admitting: *Deleted

## 2021-12-05 NOTE — Telephone Encounter (Signed)
Called patient w/CT scan appointment and prep information. She did not seem to understand. Informed her that nurse will mail her appointments to her home with instructions and she expressed appreciation for that.

## 2022-01-15 ENCOUNTER — Ambulatory Visit (HOSPITAL_BASED_OUTPATIENT_CLINIC_OR_DEPARTMENT_OTHER)
Admission: RE | Admit: 2022-01-15 | Discharge: 2022-01-15 | Disposition: A | Discharge status: Home / Self Care | Payer: Medicare HMO | Source: Ambulatory Visit | Attending: Nurse Practitioner | Admitting: Nurse Practitioner

## 2022-01-15 ENCOUNTER — Inpatient Hospital Stay: Payer: Medicare HMO | Attending: Oncology

## 2022-01-15 ENCOUNTER — Inpatient Hospital Stay: Payer: Medicare HMO

## 2022-01-15 DIAGNOSIS — C186 Malignant neoplasm of descending colon: Secondary | ICD-10-CM | POA: Diagnosis present

## 2022-01-15 DIAGNOSIS — Z9049 Acquired absence of other specified parts of digestive tract: Secondary | ICD-10-CM | POA: Insufficient documentation

## 2022-01-15 DIAGNOSIS — J449 Chronic obstructive pulmonary disease, unspecified: Secondary | ICD-10-CM | POA: Insufficient documentation

## 2022-01-15 DIAGNOSIS — I10 Essential (primary) hypertension: Secondary | ICD-10-CM | POA: Insufficient documentation

## 2022-01-15 DIAGNOSIS — Z79899 Other long term (current) drug therapy: Secondary | ICD-10-CM | POA: Insufficient documentation

## 2022-01-15 DIAGNOSIS — I4891 Unspecified atrial fibrillation: Secondary | ICD-10-CM | POA: Insufficient documentation

## 2022-01-15 LAB — CBC WITH DIFFERENTIAL (CANCER CENTER ONLY)
Abs Immature Granulocytes: 0.03 10*3/uL (ref 0.00–0.07)
Basophils Absolute: 0.1 10*3/uL (ref 0.0–0.1)
Basophils Relative: 1 %
Eosinophils Absolute: 0.2 10*3/uL (ref 0.0–0.5)
Eosinophils Relative: 3 %
HCT: 39.2 % (ref 36.0–46.0)
Hemoglobin: 12.4 g/dL (ref 12.0–15.0)
Immature Granulocytes: 0 %
Lymphocytes Relative: 23 %
Lymphs Abs: 1.6 10*3/uL (ref 0.7–4.0)
MCH: 29 pg (ref 26.0–34.0)
MCHC: 31.6 g/dL (ref 30.0–36.0)
MCV: 91.8 fL (ref 80.0–100.0)
Monocytes Absolute: 0.5 10*3/uL (ref 0.1–1.0)
Monocytes Relative: 8 %
Neutro Abs: 4.5 10*3/uL (ref 1.7–7.7)
Neutrophils Relative %: 65 %
Platelet Count: 219 10*3/uL (ref 150–400)
RBC: 4.27 MIL/uL (ref 3.87–5.11)
RDW: 15.7 % — ABNORMAL HIGH (ref 11.5–15.5)
WBC Count: 6.9 10*3/uL (ref 4.0–10.5)
nRBC: 0 % (ref 0.0–0.2)

## 2022-01-15 LAB — CEA (ACCESS): CEA (CHCC): 2.5 ng/mL (ref 0.00–5.00)

## 2022-01-15 LAB — CMP (CANCER CENTER ONLY)
ALT: 14 U/L (ref 0–44)
AST: 21 U/L (ref 15–41)
Albumin: 4.3 g/dL (ref 3.5–5.0)
Alkaline Phosphatase: 100 U/L (ref 38–126)
Anion gap: 11 (ref 5–15)
BUN: 16 mg/dL (ref 8–23)
CO2: 23 mmol/L (ref 22–32)
Calcium: 10.1 mg/dL (ref 8.9–10.3)
Chloride: 105 mmol/L (ref 98–111)
Creatinine: 0.77 mg/dL (ref 0.44–1.00)
GFR, Estimated: >60 mL/min (ref >60)
Glucose, Bld: 112 mg/dL — ABNORMAL HIGH (ref 70–99)
Potassium: 3.5 mmol/L (ref 3.5–5.1)
Sodium: 139 mmol/L (ref 135–145)
Total Bilirubin: 0.9 mg/dL (ref 0.3–1.2)
Total Protein: 7.5 g/dL (ref 6.5–8.1)

## 2022-01-15 MED ORDER — IOHEXOL 300 MG/ML  SOLN
100.0000 mL | Freq: Once | INTRAMUSCULAR | Status: AC | PRN
  Administered 2022-01-15: 75 mL via INTRAVENOUS

## 2022-01-15 MED ORDER — HEPARIN SOD (PORK) LOCK FLUSH 100 UNIT/ML IV SOLN
500.0000 [IU] | Freq: Once | INTRAVENOUS | Status: AC
  Administered 2022-01-15: 500 [IU] via INTRAVENOUS

## 2022-01-15 NOTE — Patient Instructions (Signed)

## 2022-01-20 ENCOUNTER — Inpatient Hospital Stay (HOSPITAL_BASED_OUTPATIENT_CLINIC_OR_DEPARTMENT_OTHER): Payer: Medicare HMO | Admitting: Oncology

## 2022-01-20 VITALS — BP 147/92 | HR 72 | Temp 97.8°F | Resp 18 | Ht 61.0 in | Wt 120.8 lb

## 2022-01-20 DIAGNOSIS — J449 Chronic obstructive pulmonary disease, unspecified: Secondary | ICD-10-CM | POA: Diagnosis not present

## 2022-01-20 DIAGNOSIS — C186 Malignant neoplasm of descending colon: Secondary | ICD-10-CM | POA: Diagnosis present

## 2022-01-20 DIAGNOSIS — I4891 Unspecified atrial fibrillation: Secondary | ICD-10-CM | POA: Diagnosis not present

## 2022-01-20 DIAGNOSIS — Z79899 Other long term (current) drug therapy: Secondary | ICD-10-CM | POA: Diagnosis not present

## 2022-01-20 DIAGNOSIS — I10 Essential (primary) hypertension: Secondary | ICD-10-CM | POA: Diagnosis not present

## 2022-01-20 DIAGNOSIS — Z9049 Acquired absence of other specified parts of digestive tract: Secondary | ICD-10-CM | POA: Diagnosis not present

## 2022-01-20 DIAGNOSIS — Z5111 Encounter for antineoplastic chemotherapy: Secondary | ICD-10-CM | POA: Diagnosis present

## 2022-01-20 NOTE — Progress Notes (Signed)
?  [facility] ?OFFICE PROGRESS NOTE ? ? ?Diagnosis: Colon cancer ? ?INTERVAL HISTORY:  ? ?Kelsey Bridges returns as scheduled.  She feels well.  No difficulty with bowel function.  No new complaint.  She would like to have the Port-A-Cath removed.  No neuropathy symptoms. ? ?Objective: ? ?Vital signs in last 24 hours: ? ?Blood pressure (!) 147/92, pulse 72, temperature 97.8 ?F (36.6 ?C), temperature source Oral, resp. rate 18, height $RemoveBe'5\' 1"'JaBGrjFQw$  (1.549 m), weight 120 lb 12.8 oz (54.8 kg), SpO2 100 %. ?  ? ?Lymphatics: No cervical, supraclavicular, axillary, or inguinal nodes ?Resp: Lungs clear bilaterally ?Cardio: Regular rate and rhythm ?GI: No mass, no hepatosplenomegaly, nontender ?Vascular: No leg edema  ?Skin: Mild hyperpigmentation of the hands ? ?Portacath/PICC-without erythema ? ?Lab Results: ? ?Lab Results  ?Component Value Date  ? WBC 6.9 01/15/2022  ? HGB 12.4 01/15/2022  ? HCT 39.2 01/15/2022  ? MCV 91.8 01/15/2022  ? PLT 219 01/15/2022  ? NEUTROABS 4.5 01/15/2022  ? ? ?CMP  ?Lab Results  ?Component Value Date  ? NA 139 01/15/2022  ? K 3.5 01/15/2022  ? CL 105 01/15/2022  ? CO2 23 01/15/2022  ? GLUCOSE 112 (H) 01/15/2022  ? BUN 16 01/15/2022  ? CREATININE 0.77 01/15/2022  ? CALCIUM 10.1 01/15/2022  ? PROT 7.5 01/15/2022  ? ALBUMIN 4.3 01/15/2022  ? AST 21 01/15/2022  ? ALT 14 01/15/2022  ? ALKPHOS 100 01/15/2022  ? BILITOT 0.9 01/15/2022  ? GFRNONAA >60 01/15/2022  ? GFRAA 84 06/28/2020  ? ? ?Lab Results  ?Component Value Date  ? CEA 2.50 01/15/2022  ? ? ?Medications: I have reviewed the patient's current medications. ? ? ?Assessment/Plan: ?Colon cancer-descending, stage IIIb (pT3pN1c), status post a left colectomy 07/17/2021 ?0/12 lymph nodes, 2 tumor deposits, no lymphovascular perineural invasion ?MSI-high, loss of MSH2 and MSH6 expression ?Seen by genetics, testing completed, no pathogenic mutations (see note 08/22/2021) ?Colonoscopy 04/03/2021-partially obstructing descending colon mass,  adenocarcinoma ?CTs 04/18/2021-descending colon mass, no evidence of metastatic disease, emphysema ?Elevated preoperative CEA, 04/03/2021 ?08/19/2021 repeat CEA 10 ?Cycle 1 FOLFOX 09/03/2021 ?Cycle 2 FOLFOX 09/17/2021 ?09/17/2021 repeat CEA 3.28 ?Cycle 3 FOLFOX 10/01/2021 ?Cycle 4 FOLFOX 10/16/2021 ?Cycle 5 FOLFOX 10/29/2021, oxaliplatin dose reduced ?Cycle 6 FOLFOX 11/12/2021 ?CTs 01/15/2022-no evidence of recurrent disease ?  ?Atrial fibrillation ?Tobacco use ?COPD on CT 04/18/2021 ?H. pylori June 2022 ?Hypertension ? ? ?Disposition: ?Kelsey Bridges is in clinical remission from colon cancer.  She will return for an office visit, surveillance CTs, and a CEA in 6 months.  We will refer her to interventional radiology for Port-A-Cath removal.  She will be due for a surveillance colonoscopy this summer. ? ?Betsy Coder, MD ? ?01/20/2022  ?11:41 AM ? ? ?

## 2022-01-21 ENCOUNTER — Telehealth: Payer: Self-pay | Admitting: *Deleted

## 2022-01-21 NOTE — Telephone Encounter (Signed)
CT scan scheduled for 07/22/22 at 1115/1130. Unable to reach patient with appointment. Mailed appointment to home with prep and when to drink contrast and requested return call to confirm receipt. ?

## 2022-02-04 ENCOUNTER — Other Ambulatory Visit: Payer: Self-pay | Admitting: Student

## 2022-02-05 ENCOUNTER — Inpatient Hospital Stay (HOSPITAL_COMMUNITY): Admission: RE | Admit: 2022-02-05 | Payer: Medicare HMO | Source: Ambulatory Visit

## 2022-02-05 ENCOUNTER — Ambulatory Visit (HOSPITAL_COMMUNITY): Payer: Medicare HMO

## 2022-02-13 ENCOUNTER — Other Ambulatory Visit: Payer: Self-pay | Admitting: Student

## 2022-02-16 ENCOUNTER — Inpatient Hospital Stay (HOSPITAL_COMMUNITY): Admission: RE | Admit: 2022-02-16 | Payer: Medicare HMO | Source: Ambulatory Visit

## 2022-02-16 ENCOUNTER — Ambulatory Visit (HOSPITAL_COMMUNITY): Payer: Medicare HMO

## 2022-03-27 ENCOUNTER — Encounter: Payer: Self-pay | Admitting: *Deleted

## 2022-03-31 ENCOUNTER — Encounter: Payer: Self-pay | Admitting: Oncology

## 2022-04-16 ENCOUNTER — Other Ambulatory Visit: Payer: Self-pay | Admitting: *Deleted

## 2022-04-16 DIAGNOSIS — C186 Malignant neoplasm of descending colon: Secondary | ICD-10-CM

## 2022-05-04 ENCOUNTER — Other Ambulatory Visit: Payer: Self-pay

## 2022-05-06 ENCOUNTER — Other Ambulatory Visit: Payer: Self-pay

## 2022-05-18 ENCOUNTER — Telehealth: Payer: Self-pay | Admitting: *Deleted

## 2022-05-24 ENCOUNTER — Other Ambulatory Visit: Payer: Self-pay

## 2022-05-25 ENCOUNTER — Other Ambulatory Visit: Payer: Self-pay | Admitting: Radiology

## 2022-05-27 ENCOUNTER — Other Ambulatory Visit: Payer: Self-pay

## 2022-05-27 ENCOUNTER — Encounter (HOSPITAL_COMMUNITY): Payer: Self-pay

## 2022-05-27 ENCOUNTER — Ambulatory Visit (HOSPITAL_COMMUNITY)
Admission: RE | Admit: 2022-05-27 | Discharge: 2022-05-27 | Disposition: A | Discharge status: Home / Self Care | Payer: Medicare HMO | Source: Ambulatory Visit | Attending: Oncology | Admitting: Oncology

## 2022-05-27 ENCOUNTER — Inpatient Hospital Stay: Payer: Medicare HMO | Attending: Oncology

## 2022-05-27 DIAGNOSIS — Z452 Encounter for adjustment and management of vascular access device: Secondary | ICD-10-CM | POA: Diagnosis present

## 2022-05-27 DIAGNOSIS — C186 Malignant neoplasm of descending colon: Secondary | ICD-10-CM | POA: Insufficient documentation

## 2022-05-27 HISTORY — PX: IR REMOVAL TUN ACCESS W/ PORT W/O FL MOD SED: IMG2290

## 2022-05-27 MED ORDER — LIDOCAINE-EPINEPHRINE 1 %-1:100000 IJ SOLN
INTRAMUSCULAR | Status: AC | PRN
  Administered 2022-05-27: 10 mL via INTRADERMAL

## 2022-05-27 MED ORDER — FENTANYL CITRATE (PF) 100 MCG/2ML IJ SOLN
INTRAMUSCULAR | Status: AC | PRN
  Administered 2022-05-27: 50 ug via INTRAVENOUS

## 2022-05-27 MED ORDER — SODIUM CHLORIDE 0.9 % IV SOLN: INTRAVENOUS | Status: DC

## 2022-05-27 MED ORDER — LIDOCAINE-EPINEPHRINE 1 %-1:100000 IJ SOLN
INTRAMUSCULAR | Status: AC
  Filled 2022-05-27: qty 1

## 2022-05-27 MED ORDER — FENTANYL CITRATE (PF) 100 MCG/2ML IJ SOLN
INTRAMUSCULAR | Status: AC
  Filled 2022-05-27: qty 2

## 2022-05-27 MED ORDER — MIDAZOLAM HCL 2 MG/2ML IJ SOLN
INTRAMUSCULAR | Status: AC | PRN
  Administered 2022-05-27: 1 mg via INTRAVENOUS

## 2022-05-27 MED ORDER — MIDAZOLAM HCL 2 MG/2ML IJ SOLN
INTRAMUSCULAR | Status: AC
  Filled 2022-05-27: qty 4

## 2022-05-27 NOTE — Discharge Instructions (Addendum)
Please call Interventional Radiology clinic [phone] with any questions or concerns.  You may remove your dressing and shower tomorrow.   Implanted Port Removal, Care After This sheet gives you information about how to care for yourself after your procedure. Your health care provider may also give you more specific instructions. If you have problems or questions, contact your health careprovider. What can I expect after the procedure? After the procedure, it is common to have: Soreness or pain near your incision. Some swelling or bruising near your incision. Follow these instructions at home: Medicines Take over-the-counter and prescription medicines only as told by your health care provider. If you were prescribed an antibiotic medicine, take it as told by your health care provider. Do not stop taking the antibiotic even if you start to feel better. Bathing Do not take baths, swim, or use a hot tub until your health care provider approves. Ask your health care provider if you can take showers. You may only be allowed to take sponge baths. Incision care Follow instructions from your health care provider about how to take care of your incision. Make sure you: Wash your hands with soap and water before you change your bandage (dressing). If soap and water are not available, use hand sanitizer. Change your dressing as told by your health care provider. Keep your dressing dry. Leave  skin glue, or adhesive strips in place. These skin closures may need to stay in place for 2 weeks or longer. If adhesive strip edges start to loosen and curl up, you may trim the loose edges.  Check your incision area every day for signs of infection. Check for:       - More redness, swelling, or pain.       - More fluid or blood.       - Warmth.       - Pus or a bad smell.  Driving Do not drive for 24 hours if you were given a medicine to help you relax (sedative) during your procedure. If you did not  receive a sedative, ask your health care provider when it is safe to drive.  Activity Return to your normal activities as told by your health care provider. Ask your health care provider what activities are safe for you. Do not lift anything that is heavier than 10 lb (4.5 kg), or the limit that you are told, until your health care provider says that it is safe. Do not do activities that involve lifting your arms over your head. General instructions Do not use any products that contain nicotine or tobacco, such as cigarettes and e-cigarettes. These can delay healing. If you need help quitting, ask your health care provider. Keep all follow-up visits as told by your health care provider. This is important. Contact a health care provider if: You have more redness, swelling, or pain around your incision. You have more fluid or blood coming from your incision. Your incision feels warm to the touch. You have pus or a bad smell coming from your incision. You have pain that is not relieved by your pain medicine. Get help right away if you have: A fever or chills. Chest pain. Difficulty breathing. Summary After the procedure, it is common to have pain, soreness, swelling, or bruising near your incision. If you were prescribed an antibiotic medicine, take it as told by your health care provider. Do not stop taking the antibiotic even if you start to feel better. Do not drive for 24 hours  if you were given a sedative during your procedure. Return to your normal activities as told by your health care provider. Ask your health care provider what activities are safe for you. This information is not intended to replace advice given to you by your health care provider. Make sure you discuss any questions you have with your healthcare provider. Document Revised: 11/11/2017 Document Reviewed: 11/11/2017 Elsevier Patient Education  2022 [employer].   Moderate Conscious Sedation, Adult, Care After This  sheet gives you information about how to care for yourself after your procedure. Your health care provider may also give you more specific instructions. If you have problems or questions, contact your health careprovider. What can I expect after the procedure? After the procedure, it is common to have: Sleepiness for several hours. Impaired judgment for several hours. Difficulty with balance. Vomiting if you eat too soon. Follow these instructions at home: For the time period you were told by your health care provider: Rest. Do not participate in activities where you could fall or become injured. Do not drive or use machinery. Do not drink alcohol. Do not take sleeping pills or medicines that cause drowsiness. Do not make important decisions or sign legal documents. Do not take care of children on your own. Eating and drinking  Follow the diet recommended by your health care provider. Drink enough fluid to keep your urine pale yellow. If you vomit: Drink water, juice, or soup when you can drink without vomiting. Make sure you have little or no nausea before eating solid foods.  General instructions Take over-the-counter and prescription medicines only as told by your health care provider. Have a responsible adult stay with you for the time you are told. It is important to have someone help care for you until you are awake and alert. Do not smoke. Keep all follow-up visits as told by your health care provider. This is important. Contact a health care provider if: You are still sleepy or having trouble with balance after 24 hours. You feel light-headed. You keep feeling nauseous or you keep vomiting. You develop a rash. You have a fever. You have redness or swelling around the IV site. Get help right away if: You have trouble breathing. You have new-onset confusion at home. Summary After the procedure, it is common to feel sleepy, have impaired judgment, or feel nauseous if you  eat too soon. Rest after you get home. Know the things you should not do after the procedure. Follow the diet recommended by your health care provider and drink enough fluid to keep your urine pale yellow. Get help right away if you have trouble breathing or new-onset confusion at home. This information is not intended to replace advice given to you by your health care provider. Make sure you discuss any questions you have with your healthcare provider. Document Revised: 01/26/2020 Document Reviewed: 08/24/2019 Elsevier Patient Education  2022 [employer].

## 2022-05-27 NOTE — H&P (Addendum)
Referring Physician(s): Ladell Pier  Supervising Physician: Mir, Sharen Heck  Patient Status:  [facility] OP  Chief Complaint: "I'm getting my port out"   Subjective: Patient familiar to IR service from Port-A-Cath placement on 08/26/2021.  She has a  history of colon cancer and is currently in clinical remission.  She presents today for Port-A-Cath removal.  She currently denies fever, headache, chest pain, dyspnea, cough, abdominal/back pain, nausea, vomiting or bleeding.  She does have some peripheral neuropathy. Additional med hx as below.   Past Medical History:  Diagnosis Date   Abnormal Pap smear    Allergy    Atrial fibrillation ([facility])    Dysrhythmia    Family history of breast cancer    Hammer toe    Hypertension    Past Surgical History:  Procedure Laterality Date   ABDOMINAL HYSTERECTOMY     FLEXIBLE SIGMOIDOSCOPY N/A 07/17/2021   Procedure: FLEXIBLE SIGMOIDOSCOPY;  Surgeon: Ileana Roup, MD;  Location: [facility] ORS;  Service: General;  Laterality: N/A;   HAMMER TOE SURGERY Right 2019   IR IMAGING GUIDED PORT INSERTION  08/26/2021   LAPAROSCOPIC SUPRACERVICAL HYSTERECTOMY     left ovary  10/12/1986   Varicose veins        Allergies: Penicillins  Medications: Prior to Admission medications   Medication Sig Start Date End Date Taking? Authorizing Provider  diltiazem (CARDIZEM CD) 240 MG 24 hr capsule Take 1 capsule (240 mg total) by mouth daily. 01/08/21  Yes Cleaver, Jossie Ng, NP  ELIQUIS 5 MG TABS tablet Take 1 tablet (5 mg total) by mouth 2 (two) times daily. 07/30/21  Yes Lorretta Harp, MD  losartan (COZAAR) 50 MG tablet Take 50 mg by mouth every morning. 05/24/21  Yes [provider]  meloxicam (MOBIC) 15 MG tablet Take 15 mg by mouth daily as needed (knee pain).   Yes [provider]  potassium chloride SA (KLOR-CON M) 20 MEQ tablet Take 1 tablet (20 mEq total) by mouth daily. 10/01/21  Yes Owens Shark, NP  ferrous sulfate 325 (65  FE) MG tablet Take 325 mg by mouth every morning.    [provider]  lidocaine-prilocaine (EMLA) cream Apply to portacath site 1-2 hours prior to use Patient not taking: Reported on 12/04/2021 10/01/21   Owens Shark, NP  magic mouthwash (nystatin, diphenhydrAMINE, alum & mag hydroxide) suspension mixture Take 5-10 mls by mouth 4 times a day as needed for mouth pain Patient not taking: Reported on 12/04/2021 09/17/21   Owens Shark, NP  magic mouthwash SOLN Take 5-10 mLs by mouth 4 (four) times daily as needed for mouth pain. Patient not taking: Reported on 10/01/2021 09/17/21   Owens Shark, NP  omeprazole (PRILOSEC) 20 MG capsule Take 1 capsule (20 mg total) by mouth 2 (two) times daily. Patient not taking: Reported on 01/20/2022 04/07/21   Jackquline Denmark, MD  prochlorperazine (COMPAZINE) 10 MG tablet Take 1 tablet (10 mg total) by mouth every 6 (six) hours as needed for nausea or vomiting. Patient not taking: Reported on 12/04/2021 08/19/21   Ladell Pier, MD  sertraline (ZOLOFT) 100 MG tablet Take 100 mg by mouth daily as needed (anxiety). Patient not taking: Reported on 11/14/2021    [provider]  traZODone HCl (DESYREL PO) Take by mouth. Patient not taking: Reported on 01/20/2022    [provider]     Vital Signs: BP (!) 147/85   Temp 98.2 F (36.8 C) (Oral)  Resp 16   SpO2 97%   Physical Exam awake, alert.  Chest with distant breath sounds bilaterally;   clean, intact right chest wall Port-A-Cath.  Heart with regular rate and rhythm.  Abdomen soft, positive bowel sounds, nontender.  No significant lower extremity edema.  Imaging: No results found.  Labs:  CBC: Recent Labs    10/29/21 0903 11/12/21 0800 12/04/21 1311 01/15/22 1015  WBC 4.7 3.2* 4.5 6.9  HGB 9.6* 9.6* 9.9* 12.4  HCT 30.9* 30.3* 32.1* 39.2  PLT 84* 96* 198 219    COAGS: Recent Labs    07/07/21 1524  INR 1.4*    BMP: Recent Labs    10/16/21 0812 10/29/21 0903  11/12/21 0800 01/15/22 1015  NA 141 139 141 139  K 4.0 3.6 3.8 3.5  CL 107 106 107 105  CO2 '26 23 24 23  '$ GLUCOSE 94 96 120* 112*  BUN '16 19 12 16  '$ CALCIUM 9.1 9.8 9.5 10.1  CREATININE 0.87 0.89 0.84 0.77  GFRNONAA >60 >60 >60 >60    LIVER FUNCTION TESTS: Recent Labs    10/16/21 0812 10/29/21 0903 11/12/21 0800 01/15/22 1015  BILITOT 0.5 0.6 0.9 0.9  AST 23 33 35 21  ALT '9 24 18 14  '$ ALKPHOS 70 66 86 100  PROT 6.4* 6.5 6.9 7.5  ALBUMIN 3.8 3.8 3.8 4.3    Assessment and Plan: Patient familiar to IR service from Port-A-Cath placement on 08/26/2021.  She has a  history of colon cancer with prior left colectomy/chemo and is currently in clinical remission.  She presents today for Port-A-Cath removal.  Details/risks of procedure, including but not limited to, internal bleeding, infection, injury to adjacent structures discussed with the patient with her understanding and consent.   Electronically Signed: D. Rowe Robert, PA-C 05/27/2022, 12:02 PM   I spent a total of 20 minutes at the the patient's bedside AND on the patient's hospital floor or unit, greater than 50% of which was counseling/coordinating care for port a cath removal

## 2022-05-27 NOTE — Procedures (Signed)
Interventional Radiology Procedure Note  Procedure: Chest port removal   Indication: Completion of chemotherapy  Findings: Please refer to procedural dictation for full description.  Complications: None  EBL: < 10 mL  Penda Venturi, MD [phone]   

## 2022-06-23 ENCOUNTER — Ambulatory Visit (HOSPITAL_COMMUNITY): Payer: Medicare HMO | Attending: Cardiovascular Disease

## 2022-06-23 DIAGNOSIS — I340 Nonrheumatic mitral (valve) insufficiency: Secondary | ICD-10-CM | POA: Diagnosis present

## 2022-06-23 LAB — ECHOCARDIOGRAM COMPLETE
Area-P 1/2: 3 cm2
P 1/2 time: 566 msec
S' Lateral: 3 cm

## 2022-06-24 ENCOUNTER — Other Ambulatory Visit: Payer: Self-pay | Admitting: Nurse Practitioner

## 2022-06-24 ENCOUNTER — Other Ambulatory Visit: Payer: Self-pay | Admitting: General Practice

## 2022-06-24 DIAGNOSIS — Z1231 Encounter for screening mammogram for malignant neoplasm of breast: Secondary | ICD-10-CM

## 2022-07-15 ENCOUNTER — Ambulatory Visit
Admission: RE | Admit: 2022-07-15 | Discharge: 2022-07-15 | Disposition: A | Discharge status: Home / Self Care | Payer: Medicare HMO | Source: Ambulatory Visit | Attending: Nurse Practitioner | Admitting: Nurse Practitioner

## 2022-07-15 DIAGNOSIS — Z1231 Encounter for screening mammogram for malignant neoplasm of breast: Secondary | ICD-10-CM

## 2022-07-22 ENCOUNTER — Encounter (HOSPITAL_BASED_OUTPATIENT_CLINIC_OR_DEPARTMENT_OTHER): Payer: Self-pay

## 2022-07-22 ENCOUNTER — Other Ambulatory Visit: Payer: Self-pay | Admitting: Nurse Practitioner

## 2022-07-22 ENCOUNTER — Other Ambulatory Visit (HOSPITAL_BASED_OUTPATIENT_CLINIC_OR_DEPARTMENT_OTHER): Payer: Self-pay

## 2022-07-22 ENCOUNTER — Encounter: Payer: Self-pay | Admitting: Gastroenterology

## 2022-07-22 ENCOUNTER — Encounter: Payer: Self-pay | Admitting: Nurse Practitioner

## 2022-07-22 ENCOUNTER — Inpatient Hospital Stay: Payer: Medicare HMO | Attending: Oncology

## 2022-07-22 ENCOUNTER — Ambulatory Visit (HOSPITAL_BASED_OUTPATIENT_CLINIC_OR_DEPARTMENT_OTHER)
Admission: RE | Admit: 2022-07-22 | Discharge: 2022-07-22 | Disposition: A | Discharge status: Home / Self Care | Payer: Medicare HMO | Source: Ambulatory Visit | Attending: Oncology | Admitting: Oncology

## 2022-07-22 ENCOUNTER — Telehealth: Payer: Self-pay | Admitting: Nurse Practitioner

## 2022-07-22 ENCOUNTER — Inpatient Hospital Stay (HOSPITAL_BASED_OUTPATIENT_CLINIC_OR_DEPARTMENT_OTHER): Payer: Medicare HMO | Admitting: Nurse Practitioner

## 2022-07-22 VITALS — BP 156/86 | HR 90 | Temp 98.1°F | Resp 20 | Ht 61.0 in | Wt 137.6 lb

## 2022-07-22 DIAGNOSIS — I10 Essential (primary) hypertension: Secondary | ICD-10-CM | POA: Diagnosis not present

## 2022-07-22 DIAGNOSIS — J449 Chronic obstructive pulmonary disease, unspecified: Secondary | ICD-10-CM | POA: Insufficient documentation

## 2022-07-22 DIAGNOSIS — I4891 Unspecified atrial fibrillation: Secondary | ICD-10-CM | POA: Insufficient documentation

## 2022-07-22 DIAGNOSIS — Z79899 Other long term (current) drug therapy: Secondary | ICD-10-CM | POA: Insufficient documentation

## 2022-07-22 DIAGNOSIS — Z9049 Acquired absence of other specified parts of digestive tract: Secondary | ICD-10-CM | POA: Insufficient documentation

## 2022-07-22 DIAGNOSIS — C186 Malignant neoplasm of descending colon: Secondary | ICD-10-CM | POA: Insufficient documentation

## 2022-07-22 DIAGNOSIS — Z720 Tobacco use: Secondary | ICD-10-CM | POA: Diagnosis not present

## 2022-07-22 LAB — BASIC METABOLIC PANEL - CANCER CENTER ONLY
Anion gap: 10 (ref 5–15)
BUN: 15 mg/dL (ref 8–23)
CO2: 25 mmol/L (ref 22–32)
Calcium: 9.8 mg/dL (ref 8.9–10.3)
Chloride: 101 mmol/L (ref 98–111)
Creatinine: 0.96 mg/dL (ref 0.44–1.00)
GFR, Estimated: >60 mL/min (ref >60)
Glucose, Bld: 125 mg/dL — ABNORMAL HIGH (ref 70–99)
Potassium: 3.8 mmol/L (ref 3.5–5.1)
Sodium: 136 mmol/L (ref 135–145)

## 2022-07-22 LAB — POCT I-STAT CREATININE: Creatinine, Ser: 0.9 mg/dL (ref 0.44–1.00)

## 2022-07-22 LAB — CEA (ACCESS): CEA (CHCC): 2.46 ng/mL (ref 0.00–5.00)

## 2022-07-22 MED ORDER — COVID-19 MRNA 2023-2024 VACCINE (COMIRNATY) 0.3 ML INJECTION
INTRAMUSCULAR | 0 refills | Status: DC
  Filled 2022-07-22: qty 0.3, 1d supply, fill #0

## 2022-07-22 MED ORDER — IOHEXOL 300 MG/ML  SOLN
100.0000 mL | Freq: Once | INTRAMUSCULAR | Status: AC | PRN
  Administered 2022-07-22: 85 mL via INTRAVENOUS

## 2022-07-22 NOTE — Telephone Encounter (Signed)
CT result became available after Kelsey Bridges left the office.  There is a new 6 mm right upper lobe nodule.  I contacted her with this result.  She understands the recommendation is for a follow-up chest CT in 3 months.  We will plan to see her in follow-up the same day she has the CT scan.

## 2022-07-22 NOTE — Progress Notes (Signed)
  [facility] OFFICE PROGRESS NOTE   Diagnosis: Colon cancer  INTERVAL HISTORY:   Kelsey Bridges returns as scheduled.  She underwent removal of the Port-A-Cath 05/27/2022.  She is having regular bowel movements, typically every other day, occasionally daily.  No bleeding with bowel movements.  No abdominal pain.  She has a good appetite.  She reports persistent numbness in the toes.  Objective:  Vital signs in last 24 hours:  Blood pressure (!) 156/86, pulse 90, temperature 98.1 F (36.7 C), temperature source Oral, resp. rate 20, height $RemoveBe'5\' 1"'peBQnIGCV$  (1.549 m), weight 137 lb 9.6 oz (62.4 kg), SpO2 100 %.    HEENT: No thrush or ulcers. Lymphatics: No palpable cervical, supraclavicular, axillary or inguinal lymph nodes. Resp: Lungs clear bilaterally. Cardio: Regular rate and rhythm. GI: Abdomen is soft, nontender.  No hepatosplenomegaly.  No mass. Vascular: No leg edema.  Lab Results:  Lab Results  Component Value Date   WBC 6.9 01/15/2022   HGB 12.4 01/15/2022   HCT 39.2 01/15/2022   MCV 91.8 01/15/2022   PLT 219 01/15/2022   NEUTROABS 4.5 01/15/2022    Imaging:  No results found.  Medications: I have reviewed the patient's current medications.  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 0/12 lymph nodes, 2 tumor deposits, no lymphovascular perineural invasion MSI-high, loss of MSH2 and MSH6 expression Seen by genetics, testing completed, no pathogenic mutations (see note 08/22/2021) Colonoscopy 04/03/2021-partially obstructing descending colon mass, adenocarcinoma CTs 04/18/2021-descending colon mass, no evidence of metastatic disease, emphysema Elevated preoperative CEA, 04/03/2021 08/19/2021 repeat CEA 10 Cycle 1 FOLFOX 09/03/2021 Cycle 2 FOLFOX 09/17/2021 09/17/2021 repeat CEA 3.28 Cycle 3 FOLFOX 10/01/2021 Cycle 4 FOLFOX 10/16/2021 Cycle 5 FOLFOX 10/29/2021, oxaliplatin dose reduced Cycle 6 FOLFOX  11/12/2021 CTs 01/15/2022-no evidence of recurrent disease CTs 07/22/2022-pending   Atrial fibrillation Tobacco use 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 pylori June 2022 Hypertension  Disposition: Kelsey Bridges remains in clinical remission from colon cancer.  CEA is stable in normal range.  We will follow-up on the surveillance CT scans done earlier today.  If the scans are unremarkable we will plan to see her back with a CEA in 6 months.  She is due for a surveillance colonoscopy.  Referral made to Dr. Lyndel Safe.    Ned Card ANP/GNP-BC   07/22/2022  1:20 PM

## 2022-07-23 ENCOUNTER — Other Ambulatory Visit: Payer: Self-pay | Admitting: *Deleted

## 2022-07-23 DIAGNOSIS — C186 Malignant neoplasm of descending colon: Secondary | ICD-10-CM

## 2022-07-23 NOTE — Progress Notes (Signed)
Sent referral to [facility] for surveillance colonoscopy.

## 2022-09-17 ENCOUNTER — Telehealth: Payer: Self-pay | Admitting: Gastroenterology

## 2022-09-17 ENCOUNTER — Ambulatory Visit: Payer: Medicare HMO | Admitting: Gastroenterology

## 2022-09-17 NOTE — Telephone Encounter (Signed)
Inbound call from patient at 10:00 am stating social services was late picking her up for her OV with Dr.Gupta today at 10:00 am. I advised patient that we would need to reschedule as her appt time already started. She was rescheduled with APP for 12/15. She says she is having rectal bleeding and I spoke with Curlene Labrum CMA who told Dr.Gupta about patients bleeding, he advised her to go to ED if it continues to get worse.

## 2022-09-18 ENCOUNTER — Encounter: Payer: Self-pay | Admitting: *Deleted

## 2022-09-25 ENCOUNTER — Ambulatory Visit (INDEPENDENT_AMBULATORY_CARE_PROVIDER_SITE_OTHER): Payer: Medicare HMO | Admitting: Gastroenterology

## 2022-09-25 ENCOUNTER — Encounter: Payer: Self-pay | Admitting: Gastroenterology

## 2022-09-25 ENCOUNTER — Telehealth: Payer: Self-pay | Admitting: *Deleted

## 2022-09-25 VITALS — BP 150/100 | HR 81 | Ht 61.0 in | Wt 135.1 lb

## 2022-09-25 DIAGNOSIS — Z08 Encounter for follow-up examination after completed treatment for malignant neoplasm: Secondary | ICD-10-CM | POA: Diagnosis not present

## 2022-09-25 DIAGNOSIS — Z85038 Personal history of other malignant neoplasm of large intestine: Secondary | ICD-10-CM | POA: Diagnosis not present

## 2022-09-25 DIAGNOSIS — Z809 Family history of malignant neoplasm, unspecified: Secondary | ICD-10-CM

## 2022-09-25 DIAGNOSIS — I4891 Unspecified atrial fibrillation: Secondary | ICD-10-CM | POA: Diagnosis not present

## 2022-09-25 DIAGNOSIS — F10980 Alcohol use, unspecified with alcohol-induced anxiety disorder: Secondary | ICD-10-CM

## 2022-09-25 DIAGNOSIS — Z7901 Long term (current) use of anticoagulants: Secondary | ICD-10-CM

## 2022-09-25 DIAGNOSIS — F17210 Nicotine dependence, cigarettes, uncomplicated: Secondary | ICD-10-CM

## 2022-09-25 DIAGNOSIS — F17220 Nicotine dependence, chewing tobacco, uncomplicated: Secondary | ICD-10-CM

## 2022-09-25 DIAGNOSIS — I700 Atherosclerosis of aorta: Secondary | ICD-10-CM | POA: Insufficient documentation

## 2022-09-25 MED ORDER — CLENPIQ 10-3.5-12 MG-GM -GM/160ML PO SOLN: 1.0000 | ORAL | 0 refills | Status: DC

## 2022-09-25 NOTE — Telephone Encounter (Signed)
Patient with diagnosis of afib on Eliquis for anticoagulation.    Procedure: colonoscopy Date of procedure: 11/25/22  CHA2DS2-VASc Score = 4  This indicates a 4.8% annual risk of stroke. The patient's score is based upon: CHF History: 0 HTN History: 1 Diabetes History: 0 Stroke History: 0 Vascular Disease History: 1 Age Score: 1 Gender Score: 1   Aortic atherosclerosis noted on chest CT 07/2022  CrCl 71m/min Platelet count 219K  Per office protocol, patient can hold Eliquis for 2 days prior to procedure assuming no major clinical changes noted at upcoming cardiology visit.    **This guidance is not considered finalized until pre-operative APP has relayed final recommendations.**

## 2022-09-25 NOTE — Telephone Encounter (Signed)
Primary Cardiologist:Jonathan Gwenlyn Found, MD  Chart reviewed as part of pre-operative protocol coverage. Because of SHONDA MANDARINO past medical history and time since last visit, he/she will require a follow-up visit in order to better assess preoperative cardiovascular risk.  Pre-op covering staff: - Please schedule appointment and call patient to inform them. - Please contact requesting surgeon's office via preferred method (i.e, phone, fax) to inform them of need for appointment prior to surgery.  If applicable, this message will also be routed to pharmacy pool and/or primary cardiologist for input on holding anticoagulant/antiplatelet agent as requested below so that this information is available at time of patient's appointment.   Emmaline Life, NP-C  09/25/2022, 12:17 PM [address], [address] Office [phone] Fax [phone]

## 2022-09-25 NOTE — Patient Instructions (Signed)
You have been scheduled for a colonoscopy. Please follow written instructions given to you at your visit today.  Please pick up your prep supplies at the pharmacy within the next 1-3 days. If you use inhalers (even only as needed), please bring them with you on the day of your procedure.  _______________________________________________________  If you are age 72 or older, your body mass index should be between 23-30. Your Body mass index is 25.52 kg/m. If this is out of the aforementioned range listed, please consider follow up with your Primary Care Provider.  If you are age 51 or younger, your body mass index should be between 19-25. Your Body mass index is 25.52 kg/m. If this is out of the aformentioned range listed, please consider follow up with your Primary Care Provider.   ________________________________________________________  The [facility] providers would like to encourage you to use [facility] to communicate with providers for non-urgent requests or questions.  Due to long hold times on the telephone, sending your provider a message by [facility] may be a faster and more efficient way to get a response.  Please allow 48 business hours for a response.  Please remember that this is for non-urgent requests.  _______________________________________________________  Due to recent changes in healthcare laws, you may see the results of your imaging and laboratory studies on MyChart before your provider has had a chance to review them.  We understand that in some cases there may be results that are confusing or concerning to you. Not all laboratory results come back in the same time frame and the provider may be waiting for multiple results in order to interpret others.  Please give [place] 48 hours in order for your provider to thoroughly review all the results before contacting the office for clarification of your results.

## 2022-09-25 NOTE — Progress Notes (Signed)
09/25/2022 SIREN PORRATA [id number] [birth year]   HISTORY OF PRESENT ILLNESS: This is a 71 year old female who is a patient of Dr. Steve Rattler.  She is here today to discuss and schedule a surveillance colonoscopy.  She is on Eliquis for history of atrial fibrillation as prescribed Dr. Gwenlyn Found.  She had colon cancer diagnosed in June 2022 as below.  Colonoscopy June 2022:  - Malignant partially obstructing tumor in the mid descending colon. Biopsied. Tattooed. - Two 4 to 6 mm polyps in the proximal ascending colon, removed with a cold snare. Resected and retrieved. - Two 10 to 12 mm polyps at the splenic flexure and in the mid transverse colon, removed with a hot snare. Resected and retrieved. - Non-bleeding internal hemorrhoids.  Mass was invasive adenocarcinoma.  She underwent treatment and had left hemicolectomy with Dr. Dema Severin in October 2022.  She follows with Dr. Benay Spice.  Last CEA 2 months ago was normal.  She says that she feels well.  Moves her bowels well.  No blood in her stool.   Past Medical History:  Diagnosis Date   Abnormal Pap smear    Allergy    Atrial fibrillation ([place])    Dysrhythmia    Family history of breast cancer    Hammer toe    History of colon cancer    Hypertension    Internal hemorrhoids    Osteoarthritis    Past Surgical History:  Procedure Laterality Date   ABDOMINAL HYSTERECTOMY     FLEXIBLE SIGMOIDOSCOPY N/A 07/17/2021   Procedure: FLEXIBLE SIGMOIDOSCOPY;  Surgeon: Ileana Roup, MD;  Location: [facility];  Service: General;  Laterality: N/A;   HAMMER TOE SURGERY Right 2019   IR IMAGING GUIDED PORT INSERTION  08/26/2021   IR REMOVAL TUN ACCESS W/ PORT W/O FL MOD SED  05/27/2022   LAPAROSCOPIC SUPRACERVICAL HYSTERECTOMY     left ovary  10/12/1986   Varicose veins      reports that she has been smoking cigarettes. She has been smoking an average of .2 packs per day. Her smokeless tobacco use includes snuff. She reports current  alcohol use. She reports that she does not use drugs. family history includes Cancer in her brother and sister. She was adopted. Allergies  Allergen Reactions   Penicillins Rash    Reaction to injection / does not react to tablets      Outpatient Encounter Medications as of 09/25/2022  Medication Sig   COVID-19 mRNA vaccine 2023-2024 (COMIRNATY) SUSP injection Inject into the muscle.   diltiazem (CARDIZEM CD) 240 MG 24 hr capsule TAKE 1 CAPSULE EVERY DAY   ELIQUIS 5 MG TABS tablet Take 1 tablet (5 mg total) by mouth 2 (two) times daily.   ferrous sulfate 325 (65 FE) MG tablet Take 325 mg by mouth every morning.   lidocaine-prilocaine (EMLA) cream Apply to portacath site 1-2 hours prior to use   losartan (COZAAR) 50 MG tablet Take 50 mg by mouth every morning.   magic mouthwash (nystatin, diphenhydrAMINE, alum & mag hydroxide) suspension mixture Take 5-10 mls by mouth 4 times a day as needed for mouth pain   magic mouthwash SOLN Take 5-10 mLs by mouth 4 (four) times daily as needed for mouth pain.   meloxicam (MOBIC) 15 MG tablet Take 15 mg by mouth daily as needed (knee pain).   omeprazole (PRILOSEC) 20 MG capsule Take 1 capsule (20 mg total) by mouth 2 (two) times daily.   potassium chloride SA (KLOR-CON M) 20  MEQ tablet Take 1 tablet (20 mEq total) by mouth daily.   prochlorperazine (COMPAZINE) 10 MG tablet Take 1 tablet (10 mg total) by mouth every 6 (six) hours as needed for nausea or vomiting.   sertraline (ZOLOFT) 100 MG tablet Take 100 mg by mouth daily as needed (anxiety).   traZODone HCl (DESYREL PO) Take by mouth.   No facility-administered encounter medications on file as of 09/25/2022.     REVIEW OF SYSTEMS  : All other systems reviewed and negative except where noted in the History of Present Illness.   PHYSICAL EXAM: BP (!) 150/100   Pulse 81   Ht '5\' 1"'$  (1.549 m)   Wt 135 lb 1 oz (61.3 kg)   BMI 25.52 kg/m  General: Well developed AA female in no acute  distress Head: Normocephalic and atraumatic Eyes:  Sclerae anicteric, conjunctiva pink. Ears: Normal auditory acuity Lungs: Clear throughout to auscultation; no W/R/R. Heart: Regular rate and rhythm; no M/R/G. Abdomen: Soft, non-distended.  BS present.  Non-tender. Rectal:  Will be done at the time of colonoscopy. Musculoskeletal: Symmetrical with no gross deformities  Skin: No lesions on visible extremities Extremities: No edema  Neurological: Alert oriented x 4, grossly non-focal Psychological:  Alert and cooperative. Normal mood and affect  ASSESSMENT AND PLAN: *Personal history of colon cancer: Diagnosed June 2022.  Underwent treatment and had surgery with resection October 2022.  Is due for colonoscopy recall.  Follows with Dr. Benay Spice.  Will schedule with Dr. Lyndel Safe. *Chronic anticoagulation with Eliquis for history of atrial fibrillation: Will hold Eliquis for 2 days prior to endoscopic procedures - will instruct when and how to resume after procedure. Benefits and risks of procedure explained including risks of bleeding, perforation, infection, missed lesions, reactions to medications and possible need for hospitalization and surgery for complications. Additional rare but real risk of stroke or other vascular clotting events off of Eliquis also explained and need to seek urgent help if any signs of these problems occur. Will communicate by phone or EMR with patient's prescribing provider, dr. Gwenlyn Found, to confirm that holding Eliquis is reasonable in this case.     CC:  Delford Field, FNP

## 2022-09-25 NOTE — Telephone Encounter (Signed)
Request for surgical clearance:     Endoscopy Procedure  What type of surgery is being performed?     colonoscopy  When is this surgery scheduled?     11/25/22  What type of clearance is required ?   Pharmacy  Are there any medications that need to be held prior to surgery and how long? Eliquis 2 days prior   Practice name and name of physician performing surgery?      [facility]  What is your office phone and fax number?      Phone- [phone]  [phone]  Anesthesia type (None, local, MAC, general) ?       MAC

## 2022-09-25 NOTE — Telephone Encounter (Signed)
1st attempt to reach pt regarding surgical clearance and the need for an in-office appointment.  Left pt a message to call back.

## 2022-09-27 NOTE — Progress Notes (Signed)
Agree with assessment/plan.  Raj Xander Jutras, MD [facility] [phone]  

## 2022-09-28 NOTE — Telephone Encounter (Signed)
Left a message for the pt call back to schedule an in office appt with Dr. Gwenlyn Found or his team for pre op clearance.

## 2022-10-01 NOTE — Telephone Encounter (Signed)
3rd attempt to reach pt regarding surgical clearance and the need for an appointment, left another message for pt to call back.    Unsuccessful attempts to reach pt.  Will fax back to the requesting surgeon's office to make them aware that pt can contact us if they are able to reach pt.

## 2022-10-02 NOTE — Telephone Encounter (Signed)
I have spoken to patient to reiterate that she needs to contact Dr Kennon Holter office to schedule an office visit in order to get eliquis clearance for our procedure. She verbalizes understanding and states that she will call their office.

## 2022-10-02 NOTE — Telephone Encounter (Signed)
Patient called requesting a call back.

## 2022-10-02 NOTE — Telephone Encounter (Signed)
I have spoken to patient to advise that cardiology needs to see her prior to giving her cardiac clearance/anticoagulation clearance and have asked that she contact their office to set up an appointment. She states "I was just there 2 weeks ago and a lady talked to me.' I cannot find any recent appointments and have again explained the need for visit. We were then disconnected. When I called back, I got a voicemail.

## 2022-10-20 ENCOUNTER — Ambulatory Visit: Payer: Medicare HMO | Attending: Nurse Practitioner | Admitting: Nurse Practitioner

## 2022-10-20 ENCOUNTER — Encounter: Payer: Self-pay | Admitting: Nurse Practitioner

## 2022-10-20 ENCOUNTER — Telehealth: Payer: Self-pay

## 2022-10-20 VITALS — BP 128/88 | HR 86 | Ht 61.0 in | Wt 135.2 lb

## 2022-10-20 DIAGNOSIS — I480 Paroxysmal atrial fibrillation: Secondary | ICD-10-CM

## 2022-10-20 DIAGNOSIS — I10 Essential (primary) hypertension: Secondary | ICD-10-CM | POA: Diagnosis not present

## 2022-10-20 DIAGNOSIS — E782 Mixed hyperlipidemia: Secondary | ICD-10-CM | POA: Diagnosis not present

## 2022-10-20 DIAGNOSIS — Z720 Tobacco use: Secondary | ICD-10-CM

## 2022-10-20 DIAGNOSIS — I340 Nonrheumatic mitral (valve) insufficiency: Secondary | ICD-10-CM | POA: Diagnosis not present

## 2022-10-20 DIAGNOSIS — Z01810 Encounter for preprocedural cardiovascular examination: Secondary | ICD-10-CM

## 2022-10-20 NOTE — Progress Notes (Addendum)
Office Visit    Patient Name: Kelsey Bridges Date of Encounter: 10/20/2022  Primary Care Provider:  Delford Field, FNP Primary Cardiologist:  Quay Burow, MD  Chief Complaint    72 year old female with a history of paroxysmal atrial fibrillation, mitral valve regurgitation, hypertension, hyperlipidemia, colon cancer and tobacco use who presents for preoperative cardiac evaluation.  Past Medical History    Past Medical History:  Diagnosis Date   Abnormal Pap smear    Allergy    Atrial fibrillation ([place])    Dysrhythmia    Family history of breast cancer    Hammer toe    History of colon cancer    Hypertension    Internal hemorrhoids    Osteoarthritis    Past Surgical History:  Procedure Laterality Date   ABDOMINAL HYSTERECTOMY     FLEXIBLE SIGMOIDOSCOPY N/A 07/17/2021   Procedure: FLEXIBLE SIGMOIDOSCOPY;  Surgeon: Ileana Roup, MD;  Location: [facility];  Service: General;  Laterality: N/A;   HAMMER TOE SURGERY Right 2019   IR IMAGING GUIDED PORT INSERTION  08/26/2021   IR REMOVAL TUN ACCESS W/ PORT W/O FL MOD SED  05/27/2022   LAPAROSCOPIC SUPRACERVICAL HYSTERECTOMY     left ovary  10/12/1986   Varicose veins      Allergies  Allergies  Allergen Reactions   Penicillins Rash    Reaction to injection / does not react to tablets     Labs/Other Studies Reviewed    The following studies were reviewed today: Echo 06/23/2022: IMPRESSIONS   1. Left ventricular ejection fraction, by estimation, is 60 to 65%. The  left ventricle has normal function. The left ventricle has no regional  wall motion abnormalities. There is mild left ventricular hypertrophy.  Left ventricular diastolic parameters  are consistent with Grade I diastolic dysfunction (impaired relaxation).   2. Right ventricular systolic function is normal. The right ventricular  size is normal.   3. Left atrial size was severely dilated.   4. Right atrial size was moderately dilated.   5.  The mitral valve is normal in structure. Mild mitral valve  regurgitation. No evidence of mitral stenosis.   6. The aortic valve is normal in structure. Aortic valve regurgitation is  mild. No aortic stenosis is present.   7. The inferior vena cava is normal in size with greater than 50%  respiratory variability, suggesting right atrial pressure of 3 mmHg.   Comparison(s): Prior images reviewed side by side.   Recent Labs: 01/15/2022: ALT 14; Hemoglobin 12.4; Platelet Count 219 07/22/2022: BUN 15; Creatinine, Ser 0.90; Potassium 3.8; Sodium 136  Recent Lipid Panel    Component Value Date/Time   CHOL 166 12/09/2020 1109   TRIG 70 12/09/2020 1109   HDL 54 12/09/2020 1109   CHOLHDL 3.1 12/09/2020 1109   CHOLHDL 3.1 Ratio 07/12/2007 2136   VLDL 16 07/12/2007 2136   Elizabeth 99 12/09/2020 1109    History of Present Illness    72 year old female with the above past medical history including paroxysmal atrial fibrillation, mitral valve regurgitation, hypertension, hyperlipidemia, colon cancer and tobacco use.  She was initially referred to cardiology in 2021 in the setting of paroxysmal atrial fibrillation.  She was generally asymptomatic.  She has been stable on Eliquis and diltiazem. Echocardiogram in 06/2020 showed normal LV size and function, mild to moderate MR.  She was last seen in the office on 06/04/2021 and was stable from a cardiac standpoint.  Most recent echo in 06/2022 showed EF 60 to  65%, normal LV function, no RWMA, G1 DD, normal RV systolic function, mild mitral valve regurgitation.  Repeat echocardiogram was recommended in 1 year.  Unfortunately, she was diagnosed with colon cancer.  She presents today for follow-up and for preoperative cardiac evaluation for upcoming colonoscopy with [facility]. Since her last visit she has been stable from a cardiac standpoint.  She notes occasional fleeting palpitations, she denies any chest pain, dyspnea, dizziness, presyncope, syncope.  She  continues to smoke but has cut back significantly.  She is anxious about her upcoming procedure.  Otherwise, she reports feeling well.  Home Medications    Current Outpatient Medications  Medication Sig Dispense Refill   diltiazem (CARDIZEM CD) 240 MG 24 hr capsule TAKE 1 CAPSULE EVERY DAY 90 capsule 3   ELIQUIS 5 MG TABS tablet Take 1 tablet (5 mg total) by mouth 2 (two) times daily. 180 tablet 1   losartan (COZAAR) 50 MG tablet Take 50 mg by mouth every morning.     meloxicam (MOBIC) 15 MG tablet Take 15 mg by mouth daily as needed (knee pain).     omeprazole (PRILOSEC) 20 MG capsule Take 1 capsule (20 mg total) by mouth 2 (two) times daily. 60 capsule 6   potassium chloride SA (KLOR-CON M) 20 MEQ tablet Take 1 tablet (20 mEq total) by mouth daily. 30 tablet 1   prochlorperazine (COMPAZINE) 10 MG tablet Take 1 tablet (10 mg total) by mouth every 6 (six) hours as needed for nausea or vomiting. 30 tablet 0   sertraline (ZOLOFT) 100 MG tablet Take 100 mg by mouth daily as needed (anxiety).     Sod Picosulfate-Mag Ox-Cit Acd (CLENPIQ) 10-3.5-12 MG-GM -GM/160ML SOLN Take 1 kit by mouth as directed. 320 mL 0   COVID-19 mRNA vaccine 2023-2024 (COMIRNATY) SUSP injection Inject into the muscle. 0.3 mL 0   ferrous sulfate 325 (65 FE) MG tablet Take 325 mg by mouth every morning. (Patient not taking: Reported on 10/20/2022)     lidocaine-prilocaine (EMLA) cream Apply to portacath site 1-2 hours prior to use (Patient not taking: Reported on 10/20/2022) 30 g 0   magic mouthwash (nystatin, diphenhydrAMINE, alum & mag hydroxide) suspension mixture Take 5-10 mls by mouth 4 times a day as needed for mouth pain (Patient not taking: Reported on 10/20/2022) 240 mL 0   magic mouthwash SOLN Take 5-10 mLs by mouth 4 (four) times daily as needed for mouth pain. (Patient not taking: Reported on 10/20/2022) 240 mL 1   traZODone HCl (DESYREL PO) Take by mouth. (Patient not taking: Reported on 10/20/2022)     No current  facility-administered medications for this visit.     Review of Systems    She denies chest pain, dyspnea, pnd, orthopnea, n, v, dizziness, syncope, edema, weight gain, or early satiety. All other systems reviewed and are otherwise negative except as noted above.   Physical Exam    VS:  BP 128/88   Pulse 86   Ht '5\' 1"'$  (1.549 m)   Wt 135 lb 3.2 oz (61.3 kg)   SpO2 95%   BMI 25.55 kg/m   GEN: Well nourished, well developed, in no acute distress. HEENT: normal. Neck: Supple, no JVD, carotid bruits, or masses. Cardiac: RRR, no murmurs, rubs, or gallops. No clubbing, cyanosis, edema.  Radials/DP/PT 2+ and equal bilaterally.  Respiratory:  Respirations regular and unlabored, clear to auscultation bilaterally. GI: Soft, nontender, nondistended, BS + x 4. MS: no deformity or atrophy. Skin: warm and dry, no  rash. Neuro:  Strength and sensation are intact. Psych: Normal affect.  Accessory Clinical Findings    ECG personally reviewed by me today -sinus rhythm, 86 bpm, PVCs- no acute changes.   Lab Results  Component Value Date   WBC 6.9 01/15/2022   HGB 12.4 01/15/2022   HCT 39.2 01/15/2022   MCV 91.8 01/15/2022   PLT 219 01/15/2022   Lab Results  Component Value Date   CREATININE 0.90 07/22/2022   BUN 15 07/22/2022   NA 136 07/22/2022   K 3.8 07/22/2022   CL 101 07/22/2022   CO2 25 07/22/2022   Lab Results  Component Value Date   ALT 14 01/15/2022   AST 21 01/15/2022   ALKPHOS 100 01/15/2022   BILITOT 0.9 01/15/2022   Lab Results  Component Value Date   CHOL 166 12/09/2020   HDL 54 12/09/2020   LDLCALC 99 12/09/2020   TRIG 70 12/09/2020   CHOLHDL 3.1 12/09/2020    Lab Results  Component Value Date   HGBA1C 5.6 07/07/2021    Assessment & Plan   1. Paroxysmal atrial fibrillation: Maintaining NSR.  She notes occasional fleeting palpitations. Continue diltiazem, Eliquis.  2. Mitral valve regurgitation: Most recent echo in 06/2022 showed EF 60 to 65%, normal LV  function, no RWMA, G1 DD, normal RV systolic function, mild mitral valve regurgitation.  Euvolemic and well compensated on exam. Repeat echo recommended in 1 year.   3. Hypertension: BP well controlled. Continue current antihypertensive regimen.   4. Hyperlipidemia: No recent LDL on file.  She states she has a physical coming up with her PCP.  She is not on statin therapy at this time.  5. Tobacco use: She continues to smoke approximately 2 cigarettes a day.  She has cut back significantly.  Full cessation advised.  6. Preoperative cardiac exam: According to the Revised Cardiac Risk Index (RCRI), her Perioperative Risk of Major Cardiac Event is (%): 0.4. Her Functional Capacity in METs is: 5.78 according to the [employer] Activity Status Index (DASI). Therefore, based on ACC/AHA guidelines, patient would be at acceptable risk for the planned procedure without further cardiovascular testing. Per office protocol, patient can hold Eliquis for 3 days prior to procedure. Patient will not need bridging with Lovenox (enoxaparin) around procedure. Patient should restart Eliquis on the evening of procedure or day after, at discretion of procedure MD.  I will route this recommendation to the requesting party via [place] fax function.  7. Disposition: Follow-up in 6 months with Dr. Gwenlyn Found.      Lenna Sciara, NP 10/20/2022, 1:07 PM

## 2022-10-20 NOTE — Patient Instructions (Addendum)
Medication Instructions:  Hold Eliquis 3 days prior to your procedure and start back after your procedure.   *If you need a refill on your cardiac medications before your next appointment, please call your pharmacy*   Lab Work: NONE ordered at this time of appointment   If you have labs (blood work) drawn today and your tests are completely normal, you will receive your results only by: [place] (if you have MyChart) OR A paper copy in the mail If you have any lab test that is abnormal or we need to change your treatment, we will call you to review the results.   Testing/Procedures: NONE ordered at this time of appointment     Follow-Up: At [facility], you and your health needs are our priority.  As part of our continuing mission to provide you with exceptional heart care, we have created designated Provider Care Teams.  These Care Teams include your primary Cardiologist (physician) and Advanced Practice Providers (APPs -  Physician Assistants and Nurse Practitioners) who all work together to provide you with the care you need, when you need it.  We recommend signing up for the patient portal called "MyChart".  Sign up information is provided on this After Visit Summary.  MyChart is used to connect with patients for Virtual Visits (Telemedicine).  Patients are able to view lab/test results, encounter notes, upcoming appointments, etc.  Non-urgent messages can be sent to your provider as well.   To learn more about what you can do with MyChart, go to [email or web address].    Your next appointment:   6 month(s)  The format for your next appointment:   In Person  Provider:   Quay Burow, MD     Other Instructions   Important Information About [facility]

## 2022-10-20 NOTE — Telephone Encounter (Signed)
Lm on home and cell vm.

## 2022-10-20 NOTE — Telephone Encounter (Signed)
Spoke to patient and clarified that she had been to see Dr. Gwenlyn Found and told she could hold her Eliquis for 3 days prior to his procedure.  Patient agreed.

## 2022-10-21 ENCOUNTER — Telehealth: Payer: Self-pay | Admitting: *Deleted

## 2022-10-22 ENCOUNTER — Ambulatory Visit (HOSPITAL_BASED_OUTPATIENT_CLINIC_OR_DEPARTMENT_OTHER)
Admission: RE | Admit: 2022-10-22 | Discharge: 2022-10-22 | Disposition: A | Discharge status: Home / Self Care | Payer: Medicare HMO | Source: Ambulatory Visit | Attending: Nurse Practitioner | Admitting: Nurse Practitioner

## 2022-10-22 DIAGNOSIS — C186 Malignant neoplasm of descending colon: Secondary | ICD-10-CM | POA: Insufficient documentation

## 2022-10-23 ENCOUNTER — Inpatient Hospital Stay: Payer: Medicare HMO | Attending: Oncology | Admitting: Oncology

## 2022-10-23 VITALS — BP 166/88 | HR 71 | Temp 98.2°F | Resp 18 | Ht 61.0 in | Wt 133.8 lb

## 2022-10-23 DIAGNOSIS — Z9049 Acquired absence of other specified parts of digestive tract: Secondary | ICD-10-CM | POA: Insufficient documentation

## 2022-10-23 DIAGNOSIS — Z79899 Other long term (current) drug therapy: Secondary | ICD-10-CM | POA: Diagnosis not present

## 2022-10-23 DIAGNOSIS — G629 Polyneuropathy, unspecified: Secondary | ICD-10-CM | POA: Diagnosis not present

## 2022-10-23 DIAGNOSIS — I10 Essential (primary) hypertension: Secondary | ICD-10-CM | POA: Diagnosis not present

## 2022-10-23 DIAGNOSIS — I4891 Unspecified atrial fibrillation: Secondary | ICD-10-CM | POA: Insufficient documentation

## 2022-10-23 DIAGNOSIS — C186 Malignant neoplasm of descending colon: Secondary | ICD-10-CM | POA: Insufficient documentation

## 2022-10-23 DIAGNOSIS — F17290 Nicotine dependence, other tobacco product, uncomplicated: Secondary | ICD-10-CM | POA: Insufficient documentation

## 2022-10-23 DIAGNOSIS — J432 Centrilobular emphysema: Secondary | ICD-10-CM | POA: Diagnosis not present

## 2022-10-23 DIAGNOSIS — I2510 Atherosclerotic heart disease of native coronary artery without angina pectoris: Secondary | ICD-10-CM | POA: Insufficient documentation

## 2022-10-23 DIAGNOSIS — I700 Atherosclerosis of aorta: Secondary | ICD-10-CM | POA: Insufficient documentation

## 2022-10-23 NOTE — Progress Notes (Signed)
[place] OFFICE PROGRESS NOTE   Diagnosis: Colon cancer  INTERVAL HISTORY:   Kelsey Bridges returns as scheduled.  She feels well.  She reports improvement in peripheral neuropathy symptoms.  Her bowels are functioning.  No bleeding.  She is scheduled for colonoscopy next month.  Objective:  Vital signs in last 24 hours:  Blood pressure (!) 166/88, pulse 71, temperature 98.2 F (36.8 C), temperature source Oral, resp. rate 18, height '5\' 1"'$  (1.549 m), weight 133 lb 12.8 oz (60.7 kg), SpO2 100 %.    Lymphatics: No cervical, supraclavicular, axillary, or inguinal nodes Resp: Lungs clear bilaterally, distant breath sounds, no respiratory distress Cardio: Regular rate and rhythm GI: No hepatosplenomegaly, no mass, nontender Vascular: No leg edema, venous engorgement of the lateral abdomen bilaterally   Lab Results:  Lab Results  Component Value Date   WBC 6.9 01/15/2022   HGB 12.4 01/15/2022   HCT 39.2 01/15/2022   MCV 91.8 01/15/2022   PLT 219 01/15/2022   NEUTROABS 4.5 01/15/2022    CMP  Lab Results  Component Value Date   NA 136 07/22/2022   K 3.8 07/22/2022   CL 101 07/22/2022   CO2 25 07/22/2022   GLUCOSE 125 (H) 07/22/2022   BUN 15 07/22/2022   CREATININE 0.90 07/22/2022   CALCIUM 9.8 07/22/2022   PROT 7.5 01/15/2022   ALBUMIN 4.3 01/15/2022   AST 21 01/15/2022   ALT 14 01/15/2022   ALKPHOS 100 01/15/2022   BILITOT 0.9 01/15/2022   GFRNONAA >60 07/22/2022   GFRAA 84 06/28/2020    Lab Results  Component Value Date   CEA 2.46 07/22/2022    Lab Results  Component Value Date   INR 1.4 (H) 07/07/2021   LABPROT 17.1 (H) 07/07/2021    Imaging:  CT Chest Wo Contrast  Result Date: 10/23/2022 CLINICAL DATA:  Cancer of the descending colon restaging, surveillance of lung nodule. * Tracking Code: BO * EXAM: CT CHEST WITHOUT CONTRAST TECHNIQUE: Multidetector CT imaging of the chest was performed following the standard protocol without IV  contrast. RADIATION DOSE REDUCTION: This exam was performed according to the departmental dose-optimization program which includes automated exposure control, adjustment of the mA and/or kV according to patient size and/or use of iterative reconstruction technique. COMPARISON:  08/21/2022 FINDINGS: Cardiovascular: Atherosclerotic calcification of the thoracic aorta, left anterior descending, and circumflex coronary arteries. Mediastinum/Nodes: Unremarkable Lungs/Pleura: Centrilobular emphysema. The small cavitary right upper lobe nodule about at the level of the carina on the prior exam has resolved. No current worrisome pulmonary nodule. Upper Abdomen: Abdominal aortic atherosclerosis. Musculoskeletal: Unremarkable IMPRESSION: 1. The small cavitary right upper lobe nodule has resolved. No current worrisome pulmonary nodule or findings of active thoracic malignancy. 2. Aortic and coronary atherosclerosis. 3. Emphysema. Aortic Atherosclerosis (ICD10-I70.0) and Emphysema (ICD10-J43.9). Electronically Signed   By: Van Clines M.D.   On: 10/23/2022 09:16    Medications: I have reviewed the patient's current medications.   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 0/12 lymph nodes, 2 tumor deposits, no lymphovascular perineural invasion MSI-high, loss of MSH2 and MSH6 expression Seen by genetics, testing completed, no pathogenic mutations (see note 08/22/2021) Colonoscopy 04/03/2021-partially obstructing descending colon mass, adenocarcinoma CTs 04/18/2021-descending colon mass, no evidence of metastatic disease, emphysema Elevated preoperative CEA, 04/03/2021 08/19/2021 repeat CEA 10 Cycle 1 FOLFOX 09/03/2021 Cycle 2 FOLFOX 09/17/2021 09/17/2021 repeat CEA 3.28 Cycle 3 FOLFOX 10/01/2021 Cycle 4 FOLFOX 10/16/2021 Cycle 5 FOLFOX 10/29/2021, oxaliplatin dose reduced Cycle  6 FOLFOX 11/12/2021 CTs 01/15/2022-no evidence of recurrent disease CTs  07/22/2022-new 6 mm thick-walled cavitary right upper lobe nodule with no other evidence of metastatic disease CT chest 10/22/2022-the cavitary right upper lobe nodule has resolved, no current worrisome pulmonary nodule   Atrial fibrillation Tobacco use 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 pylori June 2022 Hypertension    Disposition: Kelsey Bridges is in clinical remission from colon cancer.  The lung nodule noted on CT in October 2023 has resolved.  She will return for an office visit and CEA in 3 months.  She is scheduled for a surveillance colonoscopy next month.  Betsy Coder, MD  10/23/2022  12:17 PM

## 2022-11-02 NOTE — Telephone Encounter (Signed)
Reports she has been having abdominal discomfort for ~ 1 week w/nausea. Wants to get in to see Dr. Dema Severin again but does not have his contact information. Scheduled for f/u colonoscopy per Dr. Lyndel Safe on 12/09/22. Provided her the phone # for Dr. Orest Dikes office and instructed her to ask for triage nurse.

## 2022-11-12 ENCOUNTER — Other Ambulatory Visit: Payer: Self-pay | Admitting: Family

## 2022-11-12 ENCOUNTER — Ambulatory Visit
Admission: RE | Admit: 2022-11-12 | Discharge: 2022-11-12 | Disposition: A | Discharge status: Home / Self Care | Payer: Medicare HMO | Source: Ambulatory Visit | Attending: Nurse Practitioner | Admitting: Nurse Practitioner

## 2022-11-12 DIAGNOSIS — Z13820 Encounter for screening for osteoporosis: Secondary | ICD-10-CM

## 2022-11-20 ENCOUNTER — Ambulatory Visit
Admission: RE | Admit: 2022-11-20 | Discharge: 2022-11-20 | Disposition: A | Discharge status: Home / Self Care | Payer: Medicare HMO | Source: Ambulatory Visit | Attending: Family | Admitting: Family

## 2022-11-20 DIAGNOSIS — Z13820 Encounter for screening for osteoporosis: Secondary | ICD-10-CM

## 2022-11-24 ENCOUNTER — Other Ambulatory Visit: Payer: Self-pay | Admitting: Cardiovascular Disease

## 2022-11-24 DIAGNOSIS — I480 Paroxysmal atrial fibrillation: Secondary | ICD-10-CM

## 2022-11-24 NOTE — Telephone Encounter (Signed)
Prescription refill request for Eliquis received. Indication: Afib  Last office visit: 10/20/22 ([facility])  Scr:  0.96 (07/22/22)  Age: 72 Weight: 60.8kg  Appropriate dose. Refill sent.

## 2022-11-25 ENCOUNTER — Encounter: Payer: Medicare HMO | Admitting: Gastroenterology

## 2022-12-01 ENCOUNTER — Encounter: Payer: Self-pay | Admitting: Gastroenterology

## 2022-12-03 ENCOUNTER — Telehealth: Payer: Self-pay

## 2022-12-03 NOTE — Telephone Encounter (Signed)
   Patient Name: Kelsey Bridges  DOB: 1951/04/18 MRN: [id number]  Primary Cardiologist: Quay Burow, MD  Chart reviewed as part of pre-operative protocol coverage.   Simple dental extractions (i.e. 1-2 teeth) are considered low risk procedures per guidelines and generally do not require any specific cardiac clearance. It is also generally accepted that for simple extractions and dental cleanings, there is no need to interrupt blood thinner therapy.   SBE prophylaxis is not required for the patient from a cardiac standpoint.  I will route this recommendation to the requesting party via Epic fax function and remove from pre-op pool.  Please call with questions.  Lenna Sciara, NP 12/03/2022, 3:11 PM

## 2022-12-03 NOTE — Telephone Encounter (Signed)
Per [employer], pt is having 1 extraction

## 2022-12-03 NOTE — Telephone Encounter (Signed)
   Pre-operative Risk Assessment    Patient Name: Kelsey Bridges  DOB: 1951/08/08 MRN: [id number]     Request for Surgical Clearance    Procedure:  Dental Extraction - Amount of Teeth to be Pulled:  Unknown (Simple and surgical)  and Cleaning (scaling & root planing)  Date of Surgery:  Clearance TBD                                 Surgeon:  Unknown Surgeon's Group or Practice Name:  [facility] Phone number:  [phone] Fax number:  [phone]   Type of Clearance Requested:   - Medical    Type of Anesthesia:  Not Indicated   Additional requests/questions:   Please list any precautions to dental treatment.  Marylene Buerger   12/03/2022, 2:37 PM

## 2022-12-08 IMAGING — XA IR IMAGING GUIDED PORT INSERTION
1 series · 1 of 1 positions shown · non-contrast
Comparison: None.

INDICATION: 70-year-old female with history of colon cancer requiring central
venous access for chemotherapy administration.

EXAM:
IMPLANTED PORT A CATH PLACEMENT WITH ULTRASOUND AND FLUOROSCOPIC
GUIDANCE

[Series 1: single · 1 of 1 slices shown]
[im 1/1]
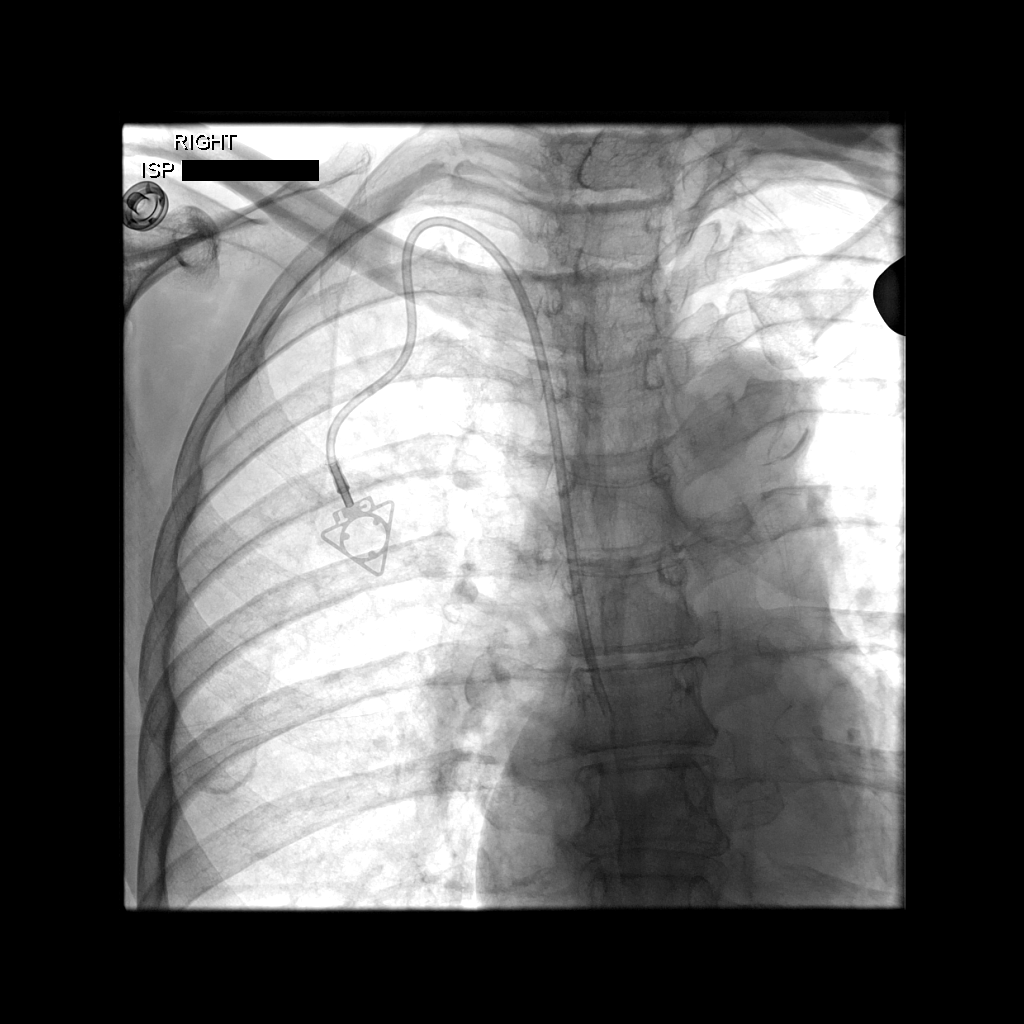

[1 of 1 positions shown; findings below may reference images not displayed]

MEDICATIONS:
None.

ANESTHESIA/SEDATION:
Moderate (conscious) sedation was employed during this procedure. A
total of Versed 1 mg and Fentanyl 50 mcg was administered
intravenously.

Moderate Sedation Time: 16 minutes. The patient's level of
consciousness and vital signs were monitored continuously by
radiology nursing throughout the procedure under my direct
supervision.

CONTRAST:  None

FLUOROSCOPY TIME:  0 minutes, 6 seconds (1 mGy)

COMPLICATIONS:
None immediate.

PROCEDURE:
The procedure, risks, benefits, and alternatives were explained to
the patient. Questions regarding the procedure were encouraged and
answered. The patient understands and consents to the procedure.

The right neck and chest were prepped with chlorhexidine in a
sterile fashion, and a sterile drape was applied covering the
operative field. Maximum barrier sterile technique with sterile
gowns and gloves were used for the procedure. A timeout was
performed prior to the initiation of the procedure.

Ultrasound was used to examine the jugular vein which was
compressible and free of internal echoes. A skin marker was used to
demarcate the planned venotomy and port pocket incision sites. Local
anesthesia was provided to these sites and the subcutaneous tunnel
track with 1% lidocaine with [DATE] epinephrine.

A small incision was created at the jugular access site and blunt
dissection was performed of the subcutaneous tissues. Under
ultrasound guidance, the jugular vein was accessed with a 21 ga
micropuncture needle and an 0.018" wire was inserted to the superior
vena cava. Real-time ultrasound guidance was utilized for vascular
access including the acquisition of a permanent ultrasound image
documenting patency of the accessed vessel. A 5 Fr micopuncture set
was then used, through which a 0.035" Rosen wire was passed under
fluoroscopic guidance into the inferior vena cava. An 8 Fr dilator
was then placed over the wire.

A subcutaneous port pocket was then created along the upper chest
wall utilizing a combination of sharp and blunt dissection. The
pocket was irrigated with sterile saline, packed with gauze, and
observed for hemorrhage. A single lumen "ISP" sized power injectable
port was chosen for placement. The 8 Fr catheter was tunneled from
the port pocket site to the venotomy incision. The port was placed
in the pocket. The external catheter was trimmed to appropriate
length. The dilator was exchanged for an 8 Fr peel-away sheath under
fluoroscopic guidance. The catheter was then placed through the
sheath and the sheath was removed. Final catheter positioning was
confirmed and documented with a fluoroscopic spot radiograph. The
port was accessed with Magnea Berglind Lydsdottir needle, aspirated, and flushed with
heparinized saline.

The deep dermal layer of the port pocket incision was closed with
interrupted 3-0 Vicryl suture. Dermabond was then placed over the
port pocket and neck incisions. The patient tolerated the procedure
well without immediate post procedural complication.
FINDINGS: After catheter placement, the tip lies within the superior
cavoatrial junction. The catheter aspirates and flushes normally and
is ready for immediate use.
IMPRESSION: Successful placement of a power injectable Port-A-Cath via the right
internal jugular vein. The catheter is ready for immediate use.

## 2022-12-09 ENCOUNTER — Ambulatory Visit (AMBULATORY_SURGERY_CENTER): Payer: Medicare HMO | Admitting: Gastroenterology

## 2022-12-09 ENCOUNTER — Encounter: Payer: Self-pay | Admitting: Gastroenterology

## 2022-12-09 VITALS — BP 150/101 | HR 115 | Temp 96.6°F | Resp 14 | Ht 61.0 in | Wt 135.0 lb

## 2022-12-09 DIAGNOSIS — Z85038 Personal history of other malignant neoplasm of large intestine: Secondary | ICD-10-CM

## 2022-12-09 DIAGNOSIS — D120 Benign neoplasm of cecum: Secondary | ICD-10-CM | POA: Diagnosis not present

## 2022-12-09 DIAGNOSIS — Z08 Encounter for follow-up examination after completed treatment for malignant neoplasm: Secondary | ICD-10-CM | POA: Diagnosis not present

## 2022-12-09 DIAGNOSIS — K635 Polyp of colon: Secondary | ICD-10-CM

## 2022-12-09 DIAGNOSIS — D122 Benign neoplasm of ascending colon: Secondary | ICD-10-CM

## 2022-12-09 DIAGNOSIS — D123 Benign neoplasm of transverse colon: Secondary | ICD-10-CM

## 2022-12-09 MED ORDER — SODIUM CHLORIDE 0.9 % IV SOLN: 500.0000 mL | Freq: Once | INTRAVENOUS | Status: DC

## 2022-12-09 NOTE — Patient Instructions (Addendum)
Handouts on polyps and hemorrhoids given to you today  Await pathology results  Resume Eliquis in 3 days (Saturday, 12/12/22)   YOU HAD AN ENDOSCOPIC PROCEDURE TODAY AT [place]:   Refer to the procedure report that was given to you for any specific questions about what was found during the examination.  If the procedure report does not answer your questions, please call your gastroenterologist to clarify.  If you requested that your care partner not be given the details of your procedure findings, then the procedure report has been included in a sealed envelope for you to review at your convenience later.  YOU SHOULD EXPECT: Some feelings of bloating in the abdomen. Passage of more gas than usual.  Walking can help get rid of the air that was put into your GI tract during the procedure and reduce the bloating. If you had a lower endoscopy (such as a colonoscopy or flexible sigmoidoscopy) you may notice spotting of blood in your stool or on the toilet paper. If you underwent a bowel prep for your procedure, you may not have a normal bowel movement for a few days.  Please Note:  You might notice some irritation and congestion in your nose or some drainage.  This is from the oxygen used during your procedure.  There is no need for concern and it should clear up in a day or so.  SYMPTOMS TO REPORT IMMEDIATELY:  Following lower endoscopy (colonoscopy or flexible sigmoidoscopy):  Excessive amounts of blood in the stool  Significant tenderness or worsening of abdominal pains  Swelling of the abdomen that is new, acute  Fever of 100F or higher  For urgent or emergent issues, a gastroenterologist can be reached at any hour by calling [phone]. Do not use MyChart messaging for urgent concerns.    DIET:  We do recommend a small meal at first, but then you may proceed to your regular diet.  Drink plenty of fluids but you should avoid alcoholic beverages for 24 hours.  ACTIVITY:   You should plan to take it easy for the rest of today and you should NOT DRIVE or use heavy machinery until tomorrow (because of the sedation medicines used during the test).    FOLLOW UP: Our staff will call the number listed on your records the next business day following your procedure.  We will call around 7:15- 8:00 am to check on you and address any questions or concerns that you may have regarding the information given to you following your procedure. If we do not reach you, we will leave a message.     If any biopsies were taken you will be contacted by phone or by letter within the next 1-3 weeks.  Please call us at [phone] if you have not heard about the biopsies in 3 weeks.    SIGNATURES/CONFIDENTIALITY: You and/or your care partner have signed paperwork which will be entered into your electronic medical record.  These signatures attest to the fact that that the information above on your After Visit Summary has been reviewed and is understood.  Full responsibility of the confidentiality of this discharge information lies with you and/or your care-partner.

## 2022-12-09 NOTE — Progress Notes (Signed)
09/25/2022 ETOYA WOLLAN [id number] Apr 17, 1951     HISTORY OF PRESENT ILLNESS: This is a 72 year old female who is a patient of Dr. Steve Rattler.  She is here today to discuss and schedule a surveillance colonoscopy.  She is on Eliquis for history of atrial fibrillation as prescribed Dr. Gwenlyn Found.  She had colon cancer diagnosed in June 2022 as below.   Colonoscopy June 2022:   - Malignant partially obstructing tumor in the mid descending colon. Biopsied. Tattooed. - Two 4 to 6 mm polyps in the proximal ascending colon, removed with a cold snare. Resected and retrieved. - Two 10 to 12 mm polyps at the splenic flexure and in the mid transverse colon, removed with a hot snare. Resected and retrieved. - Non-bleeding internal hemorrhoids.   Mass was invasive adenocarcinoma.   She underwent treatment and had left hemicolectomy with Dr. Dema Severin in October 2022.  She follows with Dr. Benay Spice.  Last CEA 2 months ago was normal.  She says that she feels well.  Moves her bowels well.  No blood in her stool.         Past Medical History:  Diagnosis Date   Abnormal Pap smear     Allergy     Atrial fibrillation ([place])     Dysrhythmia     Family history of breast cancer     Hammer toe     History of colon cancer     Hypertension     Internal hemorrhoids     Osteoarthritis           Past Surgical History:  Procedure Laterality Date   ABDOMINAL HYSTERECTOMY       FLEXIBLE SIGMOIDOSCOPY N/A 07/17/2021    Procedure: FLEXIBLE SIGMOIDOSCOPY;  Surgeon: Ileana Roup, MD;  Location: [facility];  Service: General;  Laterality: N/A;   HAMMER TOE SURGERY Right 2019   IR IMAGING GUIDED PORT INSERTION   08/26/2021   IR REMOVAL TUN ACCESS W/ PORT W/O FL MOD SED   05/27/2022   LAPAROSCOPIC SUPRACERVICAL HYSTERECTOMY       left ovary   10/12/1986   Varicose veins         reports that she has been smoking cigarettes. She has been smoking an average of .2 packs per day. Her smokeless tobacco use  includes snuff. She reports current alcohol use. She reports that she does not use drugs. family history includes Cancer in her brother and sister. She was adopted.      Allergies  Allergen Reactions   Penicillins Rash      Reaction to injection / does not react to tablets            Outpatient Encounter Medications as of 09/25/2022  Medication Sig   COVID-19 mRNA vaccine 2023-2024 (COMIRNATY) SUSP injection Inject into the muscle.   diltiazem (CARDIZEM CD) 240 MG 24 hr capsule TAKE 1 CAPSULE EVERY DAY   ELIQUIS 5 MG TABS tablet Take 1 tablet (5 mg total) by mouth 2 (two) times daily.   ferrous sulfate 325 (65 FE) MG tablet Take 325 mg by mouth every morning.   lidocaine-prilocaine (EMLA) cream Apply to portacath site 1-2 hours prior to use   losartan (COZAAR) 50 MG tablet Take 50 mg by mouth every morning.   magic mouthwash (nystatin, diphenhydrAMINE, alum & mag hydroxide) suspension mixture Take 5-10 mls by mouth 4 times a day as needed for mouth pain   magic mouthwash SOLN Take 5-10 mLs by mouth  4 (four) times daily as needed for mouth pain.   meloxicam (MOBIC) 15 MG tablet Take 15 mg by mouth daily as needed (knee pain).   omeprazole (PRILOSEC) 20 MG capsule Take 1 capsule (20 mg total) by mouth 2 (two) times daily.   potassium chloride SA (KLOR-CON M) 20 MEQ tablet Take 1 tablet (20 mEq total) by mouth daily.   prochlorperazine (COMPAZINE) 10 MG tablet Take 1 tablet (10 mg total) by mouth every 6 (six) hours as needed for nausea or vomiting.   sertraline (ZOLOFT) 100 MG tablet Take 100 mg by mouth daily as needed (anxiety).   traZODone HCl (DESYREL PO) Take by mouth.    No facility-administered encounter medications on file as of 09/25/2022.        REVIEW OF SYSTEMS  : All other systems reviewed and negative except where noted in the History of Present Illness.     PHYSICAL EXAM: BP (!) 150/100   Pulse 81   Ht '5\' 1"'$  (1.549 m)   Wt 135 lb 1 oz (61.3 kg)   BMI 25.52 kg/m   General: Well developed AA female in no acute distress Head: Normocephalic and atraumatic Eyes:  Sclerae anicteric, conjunctiva pink. Ears: Normal auditory acuity Lungs: Clear throughout to auscultation; no W/R/R. Heart: Regular rate and rhythm; no M/R/G. Abdomen: Soft, non-distended.  BS present.  Non-tender. Rectal:  Will be done at the time of colonoscopy. Musculoskeletal: Symmetrical with no gross deformities  Skin: No lesions on visible extremities Extremities: No edema  Neurological: Alert oriented x 4, grossly non-focal Psychological:  Alert and cooperative. Normal mood and affect   ASSESSMENT AND PLAN: *Personal history of colon cancer: Diagnosed June 2022.  Underwent treatment and had surgery with resection October 2022.  Is due for colonoscopy recall.  Follows with Dr. Benay Spice.  Will schedule with Dr. Lyndel Safe. *Chronic anticoagulation with Eliquis for history of atrial fibrillation: Will hold Eliquis for 2 days prior to endoscopic procedures - will instruct when and how to resume after procedure. Benefits and risks of procedure explained including risks of bleeding, perforation, infection, missed lesions, reactions to medications and possible need for hospitalization and surgery for complications. Additional rare but real risk of stroke or other vascular clotting events off of Eliquis also explained and need to seek urgent help if any signs of these problems occur. Will communicate by phone or EMR with patient's prescribing provider, dr. Gwenlyn Found, to confirm that holding Eliquis is reasonable in this case.

## 2022-12-09 NOTE — Progress Notes (Signed)
Called to room to assist during endoscopic procedure.  Patient ID and intended procedure confirmed with present staff. Received instructions for my participation in the procedure from the performing physician.  

## 2022-12-09 NOTE — Progress Notes (Signed)
VS completed by CW.   Pt's states no medical or surgical changes since previsit or office visit.   Last snuff taken today at 9am - MD made aware

## 2022-12-09 NOTE — Op Note (Addendum)
[place] Patient Name: Kelsey Bridges Procedure Date: 12/09/2022 2:04 PM MRN: [id number] Endoscopist: Jackquline Denmark , MD, [id number] Age: 72 Referring MD:  Date of Birth: [birth year] Gender: Female Account #: [id number] Procedure:                Colonoscopy Indications:              High risk colon cancer surveillance: Personal                            history of colon cancer s/p L hemicolectomy Oct                            2022. H/O polyps (had limited prep last colonoscopy) Medicines:                Monitored Anesthesia Care Procedure:                Pre-Anesthesia Assessment:                           - Prior to the procedure, a History and Physical                            was performed, and patient medications and                            allergies were reviewed. The patient's tolerance of                            previous anesthesia was also reviewed. The risks                            and benefits of the procedure and the sedation                            options and risks were discussed with the patient.                            All questions were answered, and informed consent                            was obtained. Prior Anticoagulants: Eliquis was                            held 3 days prior by patient. ASA Grade Assessment:                            II - A patient with mild systemic disease. After                            reviewing the risks and benefits, the patient was                            deemed in satisfactory condition to undergo the  procedure.                           After obtaining informed consent, the colonoscope                            was passed under direct vision. Throughout the                            procedure, the patient's blood pressure, pulse, and                            oxygen saturations were monitored continuously. The                            PCF-HQ190L Colonoscope [id number]  was introduced                            through the anus and advanced to the the cecum,                            identified by appendiceal orifice and ileocecal                            valve. The colonoscopy was performed without                            difficulty. The patient tolerated the procedure                            well. The quality of the bowel preparation was                            good. The ileocecal valve, appendiceal orifice, and                            rectum were photographed. Scope In: 2:38:40 PM Scope Out: 2:57:49 PM Scope Withdrawal Time: 0 hours 16 minutes 24 seconds  Total Procedure Duration: 0 hours 19 minutes 9 seconds  Findings:                 Three sessile polyps were found in the proximal                            ascending colon (10 mm), mid ascending colon (8 mm)                            and cecum (6 mm). These polyps were removed with a                            cold snare. The larger polyp was removed using a                            piecemeal technique. Resection and retrieval were  complete.                           Four 4 mm polyps was found in the proximal and mid                            transverse colon. These polyps were sessile                            sessile. These polyps were removed with a cold                            snare. Resection and retrieval were complete.                           There was evidence of a prior end-to-end                            colo-colonic anastomosis in the proximal sigmoid                            colon and in the descending colon, 20 cm from the                            anal verge. This was patent and was characterized                            by healthy appearing mucosa.                           Internal hemorrhoids were found during                            retroflexion. The hemorrhoids were small and Grade                            I  (internal hemorrhoids that do not prolapse).                           The exam was otherwise without abnormality on                            direct and retroflexion views. Complications:            No immediate complications. Estimated Blood Loss:     Estimated blood loss: none. Impression:               - Three 8 to 10 mm polyps in the proximal ascending                            colon, in the mid ascending colon and in the cecum,                            removed with a cold snare. Resected and retrieved.                           -  Four 4 mm polyps in the transverse colon, removed                            with a cold snare. Resected and retrieved.                           - Patent end-to-end colo-colonic anastomosis at 20                            cm from the anal verge, characterized by healthy                            appearing mucosa. No recurrence.                           - Internal hemorrhoids.                           - The examination was otherwise normal on direct                            and retroflexion views. Recommendation:           - Patient has a contact number available for                            emergencies. The signs and symptoms of potential                            delayed complications were discussed with the                            patient. Return to normal activities tomorrow.                            Written discharge instructions were provided to the                            patient.                           - Resume previous diet.                           - Continue present medications.                           - Await pathology results.                           - Repeat colonoscopy for surveillance based on                            pathology results.                           - Resume Eliquis in 3 days.                           -  The findings and recommendations were discussed                            with the patient's  family. Jackquline Denmark, MD 12/09/2022 3:06:33 PM This report has been signed electronically.

## 2022-12-09 NOTE — Progress Notes (Signed)
A and O x3. Report to RN. Tolerated MAC anesthesia well. 

## 2022-12-10 ENCOUNTER — Telehealth: Payer: Self-pay

## 2022-12-10 NOTE — Telephone Encounter (Signed)
  Follow up Call-     12/09/2022    2:03 PM 04/03/2021    1:09 PM  Call back number  Post procedure Call Back phone  # [phone], [phone]  Permission to leave phone message Yes Yes     Patient questions:  Do you have a fever, pain , or abdominal swelling? No. Pain Score  0 *  Have you tolerated food without any problems? Yes.    Have you been able to return to your normal activities? Yes.    Do you have any questions about your discharge instructions: Diet   No. Medications  No. Follow up visit  No.  Do you have questions or concerns about your Care? No.  Actions: * If pain score is 4 or above: No action needed, pain <4.

## 2022-12-19 ENCOUNTER — Encounter: Payer: Self-pay | Admitting: Gastroenterology

## 2023-01-01 ENCOUNTER — Telehealth: Payer: Self-pay | Admitting: Gastroenterology

## 2023-01-01 NOTE — Telephone Encounter (Signed)
PT is concerned with the fact that she is having a BM every day along with pain. Please advise

## 2023-01-01 NOTE — Telephone Encounter (Signed)
Pt stated that she is having 1 to 2 BM every other day. Slight pain  lower abdomen  No other complaints. Pt stated that typically she does not have a BM every day. No nausea, vomiting, diarrhea.  Pt was notified that is not abnormal to have 1 or 2 BM a day. Pt was notified to continue to monitor her abdominal pan and if it continues then to call us back in a week. Pt verbalized understanding with all questions answered.

## 2023-01-21 ENCOUNTER — Telehealth: Payer: Self-pay | Admitting: *Deleted

## 2023-01-21 ENCOUNTER — Inpatient Hospital Stay (HOSPITAL_BASED_OUTPATIENT_CLINIC_OR_DEPARTMENT_OTHER): Payer: Medicare HMO | Admitting: Oncology

## 2023-01-21 ENCOUNTER — Inpatient Hospital Stay: Payer: Medicare HMO | Attending: Oncology

## 2023-01-21 VITALS — BP 168/103 | HR 90 | Temp 98.1°F | Resp 18 | Ht 61.0 in | Wt 130.0 lb

## 2023-01-21 DIAGNOSIS — R200 Anesthesia of skin: Secondary | ICD-10-CM | POA: Diagnosis not present

## 2023-01-21 DIAGNOSIS — R202 Paresthesia of skin: Secondary | ICD-10-CM | POA: Diagnosis not present

## 2023-01-21 DIAGNOSIS — Z79899 Other long term (current) drug therapy: Secondary | ICD-10-CM | POA: Insufficient documentation

## 2023-01-21 DIAGNOSIS — I4891 Unspecified atrial fibrillation: Secondary | ICD-10-CM | POA: Insufficient documentation

## 2023-01-21 DIAGNOSIS — C186 Malignant neoplasm of descending colon: Secondary | ICD-10-CM

## 2023-01-21 DIAGNOSIS — G8929 Other chronic pain: Secondary | ICD-10-CM | POA: Insufficient documentation

## 2023-01-21 DIAGNOSIS — I10 Essential (primary) hypertension: Secondary | ICD-10-CM | POA: Diagnosis not present

## 2023-01-21 DIAGNOSIS — J449 Chronic obstructive pulmonary disease, unspecified: Secondary | ICD-10-CM | POA: Insufficient documentation

## 2023-01-21 DIAGNOSIS — Z9049 Acquired absence of other specified parts of digestive tract: Secondary | ICD-10-CM | POA: Insufficient documentation

## 2023-01-21 LAB — CEA (ACCESS): CEA (CHCC): 1.94 ng/mL (ref 0.00–5.00)

## 2023-01-21 NOTE — Telephone Encounter (Signed)
Was informed that patient is refusing to accept her ride today and seems confused about having an appointment. Left her a VM to call office. Unable to reach patient and all her emergency contacts are unavailable.

## 2023-01-21 NOTE — Progress Notes (Signed)
  [facility] OFFICE PROGRESS NOTE   Diagnosis: Colon cancer  INTERVAL HISTORY:   Ms. Cervantes returns as scheduled.  She generally feels well.  She reports persistent numbness in the toes and right fingers.  She has tingling in the toes.  Her chief complaint is chronic pain at the right hip and knee.  She is followed by orthopedics. No difficulty with bowel function.  No bleeding. She underwent a colonoscopy 12/01/2022.  Multiple polyps were removed the pathology revealed tubular adenomas and hyperplastic polyps Objective:  Vital signs in last 24 hours:  Blood pressure (!) 168/103, pulse 90, temperature 98.1 F (36.7 C), temperature source Oral, resp. rate 18, height 5\' 1"  (1.549 m), weight 130 lb (59 kg), SpO2 100 %.     Lymphatics: No cervical, supraclavicular, or inguinal nodes.  "Shotty "bilateral axillary nodes Resp: Lungs clear bilaterally Cardio: Regular rate and rhythm GI: No hepatosplenomegaly, no mass, nontender Vascular: No leg edema Neurologic: The motor exam is intact in the upper extremity bilaterally   Lab Results:  Lab Results  Component Value Date   WBC 6.9 01/15/2022   HGB 12.4 01/15/2022   HCT 39.2 01/15/2022   MCV 91.8 01/15/2022   PLT 219 01/15/2022   NEUTROABS 4.5 01/15/2022    CMP  Lab Results  Component Value Date   NA 136 07/22/2022   K 3.8 07/22/2022   CL 101 07/22/2022   CO2 25 07/22/2022   GLUCOSE 125 (H) 07/22/2022   BUN 15 07/22/2022   CREATININE 0.90 07/22/2022   CALCIUM 9.8 07/22/2022   PROT 7.5 01/15/2022   ALBUMIN 4.3 01/15/2022   AST 21 01/15/2022   ALT 14 01/15/2022   ALKPHOS 100 01/15/2022   BILITOT 0.9 01/15/2022   GFRNONAA >60 07/22/2022   GFRAA 84 06/28/2020    Lab Results  Component Value Date   CEA 2.46 07/22/2022      Medications: I have reviewed the patient's current medications.   Assessment/Plan: Colon cancer-descending, stage IIIb (pT3pN1c), status post a left colectomy  07/17/2021 0/12 lymph nodes, 2 tumor deposits, no lymphovascular perineural invasion MSI-high, loss of MSH2 and MSH6 expression Seen by genetics, testing completed, no pathogenic mutations (see note 08/22/2021) Colonoscopy 04/03/2021-partially obstructing descending colon mass, adenocarcinoma CTs 04/18/2021-descending colon mass, no evidence of metastatic disease, emphysema Elevated preoperative CEA, 04/03/2021 08/19/2021 repeat CEA 10 Cycle 1 FOLFOX 09/03/2021 Cycle 2 FOLFOX 09/17/2021 09/17/2021 repeat CEA 3.28 Cycle 3 FOLFOX 10/01/2021 Cycle 4 FOLFOX 10/16/2021 Cycle 5 FOLFOX 10/29/2021, oxaliplatin dose reduced Cycle 6 FOLFOX 11/12/2021 CTs 01/15/2022-no evidence of recurrent disease CTs 07/22/2022-new 6 mm thick-walled cavitary right upper lobe nodule with no other evidence of metastatic disease CT chest 10/22/2022-the cavitary right upper lobe nodule has resolved, no current worrisome pulmonary nodule Colonoscopy 12/09/2022-multiple polyps removed, tubular adenomas and hyperplastic polyps   Atrial fibrillation Tobacco use 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 pylori June 2022 Hypertension   Disposition: Ms. Rhude is in clinical remission from colon cancer.  She will return for an office visit and surveillance CTs in 6 months.  She will continue follow-up with her primary provider for management of hypertension.  She is followed by orthopedics for management of the right hip and knee pain.  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  Thornton Papas, MD  01/21/2023  11:48 AM

## 2023-01-21 NOTE — Telephone Encounter (Signed)
Kelsey Bridges called back to report she decline the [facility] transport due to arranging transport with Medicaid today. She will call if they do not show up.

## 2023-01-22 ENCOUNTER — Telehealth: Payer: Self-pay

## 2023-01-22 NOTE — Telephone Encounter (Signed)
Patient gave verbal understanding and had no further questions or concerns  

## 2023-01-22 NOTE — Telephone Encounter (Signed)
-----   Message from Rana Snare, NP sent at 01/22/2023  1:22 PM EDT ----- Please let her know CEA is normal.  Follow-up as scheduled.

## 2023-04-19 ENCOUNTER — Ambulatory Visit: Payer: Medicare HMO | Attending: Nurse Practitioner | Admitting: Nurse Practitioner

## 2023-04-19 NOTE — Progress Notes (Deleted)
Office Visit    Patient Name: Kelsey Bridges Date of Encounter: 04/19/2023  Primary Care Provider:  Sherral Hammers, FNP Primary Cardiologist:  Nanetta Batty, MD  Chief Complaint    72 year old female with a history of paroxysmal atrial fibrillation, mitral valve regurgitation, hypertension, hyperlipidemia, colon cancer and tobacco use who presents for follow-up related to atrial fibrillation.  Past Medical History    Past Medical History:  Diagnosis Date   Abnormal Pap smear    Allergy    Atrial fibrillation (HCC)    Dysrhythmia    Family history of breast cancer    Hammer toe    History of colon cancer    Hypertension    Internal hemorrhoids    Osteoarthritis    Past Surgical History:  Procedure Laterality Date   ABDOMINAL HYSTERECTOMY     COLONOSCOPY     FLEXIBLE SIGMOIDOSCOPY N/A 07/17/2021   Procedure: FLEXIBLE SIGMOIDOSCOPY;  Surgeon: Andria Meuse, MD;  Location: [facility];  Service: General;  Laterality: N/A;   HAMMER TOE SURGERY Right 2019   IR IMAGING GUIDED PORT INSERTION  08/26/2021   IR REMOVAL TUN ACCESS W/ PORT W/O FL MOD SED  05/27/2022   LAPAROSCOPIC SUPRACERVICAL HYSTERECTOMY     left ovary  10/12/1986   Varicose veins      Allergies  Allergies  Allergen Reactions   Penicillins Rash    Reaction to injection / does not react to tablets     Labs/Other Studies Reviewed    The following studies were reviewed today:  Cardiac Studies & Procedures       ECHOCARDIOGRAM  ECHOCARDIOGRAM COMPLETE 06/23/2022  Narrative ECHOCARDIOGRAM REPORT    Patient Name:   Kelsey Bridges Date of Exam: 06/23/2022 Medical Rec #:  [id number]           Height:       61.0 in Accession #:    [id number]          Weight:       120.8 lb Date of Birth:  May 12, 1951            BSA:          1.524 m Patient Age:    71 years            BP:           147/92 mmHg Patient Gender: F                   HR:           65 bpm. Exam Location:  [address]  Procedure: 2D Echo, Cardiac Doppler and Color Doppler  Indications:    I34.0 MR  History:        Patient has prior history of Echocardiogram examinations, most recent 06/20/2021. MR; Risk Factors:Hypertension, Dyslipidemia and Current Smoker.  Sonographer:    Samule Ohm RDCS Referring Phys: [phone] JONATHAN J BERRY  IMPRESSIONS   1. Left ventricular ejection fraction, by estimation, is 60 to 65%. The left ventricle has normal function. The left ventricle has no regional wall motion abnormalities. There is mild left ventricular hypertrophy. Left ventricular diastolic parameters are consistent with Grade I diastolic dysfunction (impaired relaxation). 2. Right ventricular systolic function is normal. The right ventricular size is normal. 3. Left atrial size was severely dilated. 4. Right atrial size was moderately dilated. 5. The mitral valve is normal in structure. Mild mitral valve regurgitation. No evidence of mitral stenosis. 6. The aortic valve  is normal in structure. Aortic valve regurgitation is mild. No aortic stenosis is present. 7. The inferior vena cava is normal in size with greater than 50% respiratory variability, suggesting right atrial pressure of 3 mmHg.  Comparison(s): Prior images reviewed side by side.  FINDINGS Left Ventricle: Left ventricular ejection fraction, by estimation, is 60 to 65%. The left ventricle has normal function. The left ventricle has no regional wall motion abnormalities. The left ventricular internal cavity size was normal in size. There is mild left ventricular hypertrophy. Left ventricular diastolic parameters are consistent with Grade I diastolic dysfunction (impaired relaxation).  Right Ventricle: The right ventricular size is normal. No increase in right ventricular wall thickness. Right ventricular systolic function is normal.  Left Atrium: Left atrial size was severely dilated.  Right Atrium: Right atrial size was moderately  dilated.  Pericardium: There is no evidence of pericardial effusion.  Mitral Valve: The mitral valve is normal in structure. Mild mitral annular calcification. Mild mitral valve regurgitation. No evidence of mitral valve stenosis.  Tricuspid Valve: The tricuspid valve is normal in structure. Tricuspid valve regurgitation is trivial. No evidence of tricuspid stenosis.  Aortic Valve: The aortic valve is normal in structure. Aortic valve regurgitation is mild. Aortic regurgitation PHT measures 566 msec. No aortic stenosis is present.  Pulmonic Valve: The pulmonic valve was normal in structure. Pulmonic valve regurgitation is trivial. No evidence of pulmonic stenosis.  Aorta: The aortic root is normal in size and structure.  Venous: The inferior vena cava is normal in size with greater than 50% respiratory variability, suggesting right atrial pressure of 3 mmHg.  IAS/Shunts: There is right bowing of the interatrial septum, suggestive of elevated left atrial pressure. There is redundancy of the interatrial septum. No atrial level shunt detected by color flow Doppler.   LEFT VENTRICLE PLAX 2D LVIDd:         4.50 cm   Diastology LVIDs:         3.00 cm   LV e' medial:    10.20 cm/s LV PW:         1.30 cm   LV E/e' medial:  7.9 LV IVS:        1.10 cm   LV e' lateral:   11.70 cm/s LVOT diam:     1.90 cm   LV E/e' lateral: 6.9 LV SV:         58 LV SV Index:   38 LVOT Area:     2.84 cm   RIGHT VENTRICLE             IVC RV S prime:     13.10 cm/s  IVC diam: 1.10 cm TAPSE (M-mode): 2.0 cm RVSP:           23.4 mmHg  LEFT ATRIUM             Index        RIGHT ATRIUM           Index LA diam:        3.20 cm 2.10 cm/m   RA Pressure: 3.00 mmHg LA Vol (A2C):   59.8 ml 39.23 ml/m  RA Area:     14.50 cm LA Vol (A4C):   78.9 ml 51.76 ml/m  RA Volume:   44.10 ml  28.93 ml/m LA Biplane Vol: 70.8 ml 46.44 ml/m AORTIC VALVE LVOT Vmax:   91.80 cm/s LVOT Vmean:  64.000 cm/s LVOT VTI:    0.203  m AI PHT:  566 msec  AORTA Ao Root diam: 3.10 cm Ao Asc diam:  3.20 cm  MITRAL VALVE               TRICUSPID VALVE MV Area (PHT): 3.00 cm    TR Peak grad:   20.4 mmHg MV Decel Time: 253 msec    TR Vmax:        226.00 cm/s MV E velocity: 80.70 cm/s  Estimated RAP:  3.00 mmHg MV A velocity: 52.40 cm/s  RVSP:           23.4 mmHg MV E/A ratio:  1.54 SHUNTS Systemic VTI:  0.20 m Systemic Diam: 1.90 cm  Donato Schultz MD Electronically signed by Donato Schultz MD Signature Date/Time: 06/23/2022/2:28:27 PM    Final    MONITORS  LONG TERM MONITOR (3-14 DAYS) 06/17/2020  Narrative 1. SR/SB/ST 2. Tachy/ Brady syndrome with AFIB 3. ROV to discuss          Recent Labs: 07/22/2022: BUN 15; Creatinine, Ser 0.90; Potassium 3.8; Sodium 136  Recent Lipid Panel    Component Value Date/Time   CHOL 166 12/09/2020 1109   TRIG 70 12/09/2020 1109   HDL 54 12/09/2020 1109   CHOLHDL 3.1 12/09/2020 1109   CHOLHDL 3.1 Ratio 07/12/2007 2136   VLDL 16 07/12/2007 2136   LDLCALC 99 12/09/2020 1109    History of Present Illness    72 year old female with the above past medical history including paroxysmal atrial fibrillation, mitral valve regurgitation, hypertension, hyperlipidemia, colon cancer and tobacco use.   She was initially referred to cardiology in 2021 in the setting of paroxysmal atrial fibrillation.  She was generally asymptomatic.  She has been stable on Eliquis and diltiazem. Echocardiogram in 06/2020 showed normal LV size and function, mild to moderate MR.  Most recent echo in 06/2022 showed EF 60 to 65%, normal LV function, no RWMA, G1 DD, normal RV systolic function, mild mitral valve regurgitation.  She was diagnosed with colon cancer.  She was last seen in the office on 10/20/2022 and was stable from a cardiac standpoint.  She was cleared for colonoscopy at the time.   She presents today for follow-up. Since her last visit she has been   1. Paroxysmal atrial fibrillation:  Maintaining NSR.  She notes occasional fleeting palpitations. Continue diltiazem, Eliquis.   2. Mitral valve regurgitation: Most recent echo in 06/2022 showed EF 60 to 65%, normal LV function, no RWMA, G1 DD, normal RV systolic function, mild mitral valve regurgitation.  Euvolemic and well compensated on exam. Repeat echo recommended in 1 year.    3. Hypertension: BP well controlled. Continue current antihypertensive regimen.    4. Hyperlipidemia: No recent LDL on file.  She states she has a physical coming up with her PCP.  She is not on statin therapy at this time.   5. Tobacco use: She continues to smoke approximately 2 cigarettes a day.  She has cut back significantly.  Full cessation advised.   6. Disposition: Follow-up in  Home Medications    Current Outpatient Medications  Medication Sig Dispense Refill   augmented betamethasone dipropionate (DIPROLENE-AF) 0.05 % ointment Apply topically daily.     diltiazem (CARDIZEM CD) 240 MG 24 hr capsule TAKE 1 CAPSULE EVERY DAY 90 capsule 3   ELIQUIS 5 MG TABS tablet TAKE 1 TABLET TWICE DAILY 180 tablet 3   ferrous sulfate 325 (65 FE) MG tablet Take 325 mg by mouth every morning. (Patient not taking: Reported on 10/20/2022)  losartan (COZAAR) 50 MG tablet Take 100 mg by mouth every morning.     meloxicam (MOBIC) 15 MG tablet Take 15 mg by mouth daily as needed (knee pain).     nitrofurantoin, macrocrystal-monohydrate, (MACROBID) 100 MG capsule Take 100 mg by mouth 2 (two) times daily.     potassium chloride SA (KLOR-CON M) 20 MEQ tablet Take 1 tablet (20 mEq total) by mouth daily. 30 tablet 1   prochlorperazine (COMPAZINE) 10 MG tablet Take 1 tablet (10 mg total) by mouth every 6 (six) hours as needed for nausea or vomiting. (Patient not taking: Reported on 10/23/2022) 30 tablet 0   sertraline (ZOLOFT) 100 MG tablet Take 100 mg by mouth daily as needed (anxiety).     No current facility-administered medications for this visit.     Review of  Systems    ***.  All other systems reviewed and are otherwise negative except as noted above.    Physical Exam    VS:  There were no vitals taken for this visit. , BMI There is no height or weight on file to calculate BMI.     GEN: Well nourished, well developed, in no acute distress. HEENT: normal. Neck: Supple, no JVD, carotid bruits, or masses. Cardiac: RRR, no murmurs, rubs, or gallops. No clubbing, cyanosis, edema.  Radials/DP/PT 2+ and equal bilaterally.  Respiratory:  Respirations regular and unlabored, clear to auscultation bilaterally. GI: Soft, nontender, nondistended, BS + x 4. MS: no deformity or atrophy. Skin: warm and dry, no rash. Neuro:  Strength and sensation are intact. Psych: Normal affect.  Accessory Clinical Findings    ECG personally reviewed by me today -    - no acute changes.   Lab Results  Component Value Date   WBC 6.9 01/15/2022   HGB 12.4 01/15/2022   HCT 39.2 01/15/2022   MCV 91.8 01/15/2022   PLT 219 01/15/2022   Lab Results  Component Value Date   CREATININE 0.90 07/22/2022   BUN 15 07/22/2022   NA 136 07/22/2022   K 3.8 07/22/2022   CL 101 07/22/2022   CO2 25 07/22/2022   Lab Results  Component Value Date   ALT 14 01/15/2022   AST 21 01/15/2022   ALKPHOS 100 01/15/2022   BILITOT 0.9 01/15/2022   Lab Results  Component Value Date   CHOL 166 12/09/2020   HDL 54 12/09/2020   LDLCALC 99 12/09/2020   TRIG 70 12/09/2020   CHOLHDL 3.1 12/09/2020    Lab Results  Component Value Date   HGBA1C 5.6 07/07/2021    Assessment & Plan    1.  ***  No BP recorded.  {Refresh Note OR Click here to enter BP  :1}***   Joylene Grapes, NP 04/19/2023, 6:20 AM

## 2023-06-29 ENCOUNTER — Ambulatory Visit: Payer: Medicare HMO | Admitting: Nurse Practitioner

## 2023-07-01 ENCOUNTER — Telehealth: Payer: Self-pay | Admitting: *Deleted

## 2023-07-01 NOTE — Telephone Encounter (Signed)
Attempting to schedule her CT scan due in October. Need to clarify if she wants [facility] or [facility] for scan and where to have her labs?  No answer at both numbers.

## 2023-07-26 NOTE — Telephone Encounter (Signed)
error 

## 2023-07-27 ENCOUNTER — Other Ambulatory Visit: Payer: Medicare HMO

## 2023-07-27 ENCOUNTER — Ambulatory Visit (HOSPITAL_BASED_OUTPATIENT_CLINIC_OR_DEPARTMENT_OTHER): Admission: RE | Admit: 2023-07-27 | Payer: Medicare HMO | Source: Ambulatory Visit

## 2023-07-27 ENCOUNTER — Inpatient Hospital Stay: Payer: Medicare HMO | Attending: Oncology

## 2023-07-29 ENCOUNTER — Ambulatory Visit: Payer: Medicare HMO | Admitting: Oncology

## 2023-07-29 ENCOUNTER — Encounter: Payer: Self-pay | Admitting: *Deleted

## 2023-08-02 ENCOUNTER — Ambulatory Visit: Payer: Medicare HMO | Attending: Nurse Practitioner | Admitting: Nurse Practitioner

## 2023-08-02 NOTE — Progress Notes (Deleted)
Office Visit    Patient Name: Kelsey Bridges Date of Encounter: 08/02/2023  Primary Care Provider:  Donato Schultz, FNP Primary Cardiologist:  Nanetta Batty, MD  Chief Complaint    72 year old female with a history of paroxysmal atrial fibrillation, mitral valve regurgitation, hypertension, hyperlipidemia, colon cancer and tobacco use  who presents for follow-up related to atrial fibrillation and mitral valve regurgitation.  Past Medical History    Past Medical History:  Diagnosis Date   Abnormal Pap smear    Allergy    Atrial fibrillation (HCC)    Dysrhythmia    Family history of breast cancer    Hammer toe    History of colon cancer    Hypertension    Internal hemorrhoids    Osteoarthritis    Past Surgical History:  Procedure Laterality Date   ABDOMINAL HYSTERECTOMY     COLONOSCOPY     FLEXIBLE SIGMOIDOSCOPY N/A 07/17/2021   Procedure: FLEXIBLE SIGMOIDOSCOPY;  Surgeon: Andria Meuse, MD;  Location: [facility];  Service: General;  Laterality: N/A;   HAMMER TOE SURGERY Right 2019   IR IMAGING GUIDED PORT INSERTION  08/26/2021   IR REMOVAL TUN ACCESS W/ PORT W/O FL MOD SED  05/27/2022   LAPAROSCOPIC SUPRACERVICAL HYSTERECTOMY     left ovary  10/12/1986   Varicose veins      Allergies  Allergies  Allergen Reactions   Penicillins Rash    Reaction to injection / does not react to tablets     Labs/Other Studies Reviewed    The following studies were reviewed today:  Cardiac Studies & Procedures       ECHOCARDIOGRAM  ECHOCARDIOGRAM COMPLETE 06/23/2022  Narrative ECHOCARDIOGRAM REPORT    Patient Name:   Kelsey Bridges Date of Exam: 06/23/2022 Medical Rec #:  [id number]           Height:       61.0 in Accession #:    [id number]          Weight:       120.8 lb Date of Birth:  19-Feb-1951            BSA:          1.524 m Patient Age:    71 years            BP:           147/92 mmHg Patient Gender: F                   HR:           65  bpm. Exam Location:  [address]  Procedure: 2D Echo, Cardiac Doppler and Color Doppler  Indications:    I34.0 MR  History:        Patient has prior history of Echocardiogram examinations, most recent 06/20/2021. MR; Risk Factors:Hypertension, Dyslipidemia and Current Smoker.  Sonographer:    Samule Ohm RDCS Referring Phys: [phone] JONATHAN J BERRY  IMPRESSIONS   1. Left ventricular ejection fraction, by estimation, is 60 to 65%. The left ventricle has normal function. The left ventricle has no regional wall motion abnormalities. There is mild left ventricular hypertrophy. Left ventricular diastolic parameters are consistent with Grade I diastolic dysfunction (impaired relaxation). 2. Right ventricular systolic function is normal. The right ventricular size is normal. 3. Left atrial size was severely dilated. 4. Right atrial size was moderately dilated. 5. The mitral valve is normal in structure. Mild mitral valve regurgitation. No evidence of mitral  stenosis. 6. The aortic valve is normal in structure. Aortic valve regurgitation is mild. No aortic stenosis is present. 7. The inferior vena cava is normal in size with greater than 50% respiratory variability, suggesting right atrial pressure of 3 mmHg.  Comparison(s): Prior images reviewed side by side.  FINDINGS Left Ventricle: Left ventricular ejection fraction, by estimation, is 60 to 65%. The left ventricle has normal function. The left ventricle has no regional wall motion abnormalities. The left ventricular internal cavity size was normal in size. There is mild left ventricular hypertrophy. Left ventricular diastolic parameters are consistent with Grade I diastolic dysfunction (impaired relaxation).  Right Ventricle: The right ventricular size is normal. No increase in right ventricular wall thickness. Right ventricular systolic function is normal.  Left Atrium: Left atrial size was severely dilated.  Right Atrium: Right  atrial size was moderately dilated.  Pericardium: There is no evidence of pericardial effusion.  Mitral Valve: The mitral valve is normal in structure. Mild mitral annular calcification. Mild mitral valve regurgitation. No evidence of mitral valve stenosis.  Tricuspid Valve: The tricuspid valve is normal in structure. Tricuspid valve regurgitation is trivial. No evidence of tricuspid stenosis.  Aortic Valve: The aortic valve is normal in structure. Aortic valve regurgitation is mild. Aortic regurgitation PHT measures 566 msec. No aortic stenosis is present.  Pulmonic Valve: The pulmonic valve was normal in structure. Pulmonic valve regurgitation is trivial. No evidence of pulmonic stenosis.  Aorta: The aortic root is normal in size and structure.  Venous: The inferior vena cava is normal in size with greater than 50% respiratory variability, suggesting right atrial pressure of 3 mmHg.  IAS/Shunts: There is right bowing of the interatrial septum, suggestive of elevated left atrial pressure. There is redundancy of the interatrial septum. No atrial level shunt detected by color flow Doppler.   LEFT VENTRICLE PLAX 2D LVIDd:         4.50 cm   Diastology LVIDs:         3.00 cm   LV e' medial:    10.20 cm/s LV PW:         1.30 cm   LV E/e' medial:  7.9 LV IVS:        1.10 cm   LV e' lateral:   11.70 cm/s LVOT diam:     1.90 cm   LV E/e' lateral: 6.9 LV SV:         58 LV SV Index:   38 LVOT Area:     2.84 cm   RIGHT VENTRICLE             IVC RV S prime:     13.10 cm/s  IVC diam: 1.10 cm TAPSE (M-mode): 2.0 cm RVSP:           23.4 mmHg  LEFT ATRIUM             Index        RIGHT ATRIUM           Index LA diam:        3.20 cm 2.10 cm/m   RA Pressure: 3.00 mmHg LA Vol (A2C):   59.8 ml 39.23 ml/m  RA Area:     14.50 cm LA Vol (A4C):   78.9 ml 51.76 ml/m  RA Volume:   44.10 ml  28.93 ml/m LA Biplane Vol: 70.8 ml 46.44 ml/m AORTIC VALVE LVOT Vmax:   91.80 cm/s LVOT Vmean:  64.000  cm/s LVOT VTI:    0.203 m  AI PHT:      566 msec  AORTA Ao Root diam: 3.10 cm Ao Asc diam:  3.20 cm  MITRAL VALVE               TRICUSPID VALVE MV Area (PHT): 3.00 cm    TR Peak grad:   20.4 mmHg MV Decel Time: 253 msec    TR Vmax:        226.00 cm/s MV E velocity: 80.70 cm/s  Estimated RAP:  3.00 mmHg MV A velocity: 52.40 cm/s  RVSP:           23.4 mmHg MV E/A ratio:  1.54 SHUNTS Systemic VTI:  0.20 m Systemic Diam: 1.90 cm  Donato Schultz MD Electronically signed by Donato Schultz MD Signature Date/Time: 06/23/2022/2:28:27 PM    Final    MONITORS  LONG TERM MONITOR (3-14 DAYS) 06/13/2020  Narrative 1. SR/SB/ST 2. Tachy/ Brady syndrome with AFIB 3. ROV to discuss          Recent Labs: No results found for requested labs within last 365 days.  Recent Lipid Panel    Component Value Date/Time   CHOL 166 12/09/2020 1109   TRIG 70 12/09/2020 1109   HDL 54 12/09/2020 1109   CHOLHDL 3.1 12/09/2020 1109   CHOLHDL 3.1 Ratio 07/12/2007 2136   VLDL 16 07/12/2007 2136   LDLCALC 99 12/09/2020 1109    History of Present Illness    72 year old female with the above past medical history including paroxysmal atrial fibrillation, mitral valve regurgitation, hypertension, hyperlipidemia, colon cancer and tobacco use.   She was initially referred to cardiology in 2021 in the setting of paroxysmal atrial fibrillation.  She was generally asymptomatic.  She has been stable on Eliquis and diltiazem. Echocardiogram in 06/2020 showed normal LV size and function, mild to moderate MR.  Most recent echo in 06/2022 showed EF 60 to 65%, normal LV function, no RWMA, G1 DD, normal RV systolic function, mild mitral valve regurgitation.  Repeat echocardiogram was recommended in 1 year.  Unfortunately, she was diagnosed with colon cancer.  She was last seen in the office on 10/20/2022 and was stable from a cardiac standpoint.   She presents today for follow-up. Since her last visit she has been   1.  Paroxysmal atrial fibrillation: Maintaining NSR.  She notes occasional fleeting palpitations. Continue diltiazem, Eliquis.   2. Mitral valve regurgitation: Most recent echo in 06/2022 showed EF 60 to 65%, normal LV function, no RWMA, G1 DD, normal RV systolic function, mild mitral valve regurgitation.  Euvolemic and well compensated on exam. Repeat echo recommended in 1 year.    3. Hypertension: BP well controlled. Continue current antihypertensive regimen.    4. Hyperlipidemia: No recent LDL on file.  She states she has a physical coming up with her PCP.  She is not on statin therapy at this time.   5. Tobacco use: She continues to smoke approximately 2 cigarettes a day.  She has cut back significantly.  Full cessation advised.  6. Disposition: Follow-up in  Home Medications    Current Outpatient Medications  Medication Sig Dispense Refill   augmented betamethasone dipropionate (DIPROLENE-AF) 0.05 % ointment Apply topically daily.     diltiazem (CARDIZEM CD) 240 MG 24 hr capsule TAKE 1 CAPSULE EVERY DAY 90 capsule 3   ELIQUIS 5 MG TABS tablet TAKE 1 TABLET TWICE DAILY 180 tablet 3   ferrous sulfate 325 (65 FE) MG tablet Take 325 mg by mouth every morning. (Patient not  taking: Reported on 10/20/2022)     losartan (COZAAR) 50 MG tablet Take 100 mg by mouth every morning.     meloxicam (MOBIC) 15 MG tablet Take 15 mg by mouth daily as needed (knee pain).     nitrofurantoin, macrocrystal-monohydrate, (MACROBID) 100 MG capsule Take 100 mg by mouth 2 (two) times daily.     potassium chloride SA (KLOR-CON M) 20 MEQ tablet Take 1 tablet (20 mEq total) by mouth daily. 30 tablet 1   prochlorperazine (COMPAZINE) 10 MG tablet Take 1 tablet (10 mg total) by mouth every 6 (six) hours as needed for nausea or vomiting. (Patient not taking: Reported on 10/23/2022) 30 tablet 0   sertraline (ZOLOFT) 100 MG tablet Take 100 mg by mouth daily as needed (anxiety).     No current facility-administered medications  for this visit.     Review of Systems    ***.  All other systems reviewed and are otherwise negative except as noted above.    Physical Exam    VS:  There were no vitals taken for this visit. , BMI There is no height or weight on file to calculate BMI.     GEN: Well nourished, well developed, in no acute distress. HEENT: normal. Neck: Supple, no JVD, carotid bruits, or masses. Cardiac: RRR, no murmurs, rubs, or gallops. No clubbing, cyanosis, edema.  Radials/DP/PT 2+ and equal bilaterally.  Respiratory:  Respirations regular and unlabored, clear to auscultation bilaterally. GI: Soft, nontender, nondistended, BS + x 4. MS: no deformity or atrophy. Skin: warm and dry, no rash. Neuro:  Strength and sensation are intact. Psych: Normal affect.  Accessory Clinical Findings    ECG personally reviewed by me today -    - no acute changes.   Lab Results  Component Value Date   WBC 6.9 01/15/2022   HGB 12.4 01/15/2022   HCT 39.2 01/15/2022   MCV 91.8 01/15/2022   PLT 219 01/15/2022   Lab Results  Component Value Date   CREATININE 0.90 07/22/2022   BUN 15 07/22/2022   NA 136 07/22/2022   K 3.8 07/22/2022   CL 101 07/22/2022   CO2 25 07/22/2022   Lab Results  Component Value Date   ALT 14 01/15/2022   AST 21 01/15/2022   ALKPHOS 100 01/15/2022   BILITOT 0.9 01/15/2022   Lab Results  Component Value Date   CHOL 166 12/09/2020   HDL 54 12/09/2020   LDLCALC 99 12/09/2020   TRIG 70 12/09/2020   CHOLHDL 3.1 12/09/2020    Lab Results  Component Value Date   HGBA1C 5.6 07/07/2021    Assessment & Plan    1.  ***  No BP recorded.  {Refresh Note OR Click here to enter BP  :1}***   Joylene Grapes, NP 08/02/2023, 6:48 AM

## 2023-08-05 ENCOUNTER — Inpatient Hospital Stay: Payer: Medicare HMO | Admitting: Oncology

## 2023-09-01 NOTE — Telephone Encounter (Signed)
Telephone call  

## 2023-09-22 ENCOUNTER — Ambulatory Visit (HOSPITAL_BASED_OUTPATIENT_CLINIC_OR_DEPARTMENT_OTHER): Payer: Medicare HMO

## 2023-09-22 ENCOUNTER — Telehealth: Payer: Self-pay

## 2023-09-22 ENCOUNTER — Inpatient Hospital Stay: Payer: Medicare HMO | Attending: Oncology

## 2023-09-22 NOTE — Telephone Encounter (Signed)
Patient called stating wanted to cancel lab and CT scan today due to not feeling well. Patient stated having diarrhea, vomiting which started last night at 11:30 pm then at 4 am. Stools light brown, liquid, no blood, temperature 96.1, has not been around anyone sick. Recommended taking Compazine that is on med list for nausea/vomiting. Patient took Pepto Bismol at 4 am, has not vomited or had diarrhea since. Clear liquids until symptoms subside, if symptoms get worse to call the [facility] for further evaluation.

## 2023-09-27 ENCOUNTER — Inpatient Hospital Stay: Payer: Medicare HMO | Admitting: Oncology

## 2023-10-05 ENCOUNTER — Telehealth: Payer: Self-pay | Admitting: *Deleted

## 2023-10-05 NOTE — Telephone Encounter (Signed)
Needs to reschedule lab/CT and MD visit that was cancelled earlier this month. Not able to reach patient by telephone.

## 2023-11-16 ENCOUNTER — Telehealth: Payer: Self-pay | Admitting: *Deleted

## 2023-11-16 NOTE — Telephone Encounter (Signed)
 Called patient to inquire if she is ready to reschedule her CT scan and OV and she agrees. Asking for March on a Monday. CT 3/24 at [facility] at [phone]/1000. Scheduling message to arrange for other appointments that day and she will be called. She will need [employer] transportation arranged as well.

## 2023-11-17 ENCOUNTER — Telehealth: Payer: Self-pay | Admitting: *Deleted

## 2023-11-17 NOTE — Telephone Encounter (Addendum)
 Called Kelsey Bridges with her March appointments for lab/flush/CT/OV. She prefers nurse send information in mail to her home. Confirmed her address. She reports not getting out much due to progressive numbness/pain in her toes. Asking if there is a mediation MD could order to help with this issue? Per Dr. Cloretta: Prefers to see her before any prescriptions are written. Can refer her to neurology if she wishes. Kelsey Bridges prefers to wait and see MD. Appointment calendar and prep for scan mailed to her home as requested.

## 2023-12-31 ENCOUNTER — Telehealth: Payer: Self-pay | Admitting: *Deleted

## 2023-12-31 NOTE — Telephone Encounter (Signed)
 Out of town due to death in family, will call and reschedule labs, scan and office visit when she returns

## 2024-01-03 ENCOUNTER — Other Ambulatory Visit: Payer: Medicare HMO | Admitting: Oncology

## 2024-01-03 ENCOUNTER — Other Ambulatory Visit: Payer: Medicare HMO

## 2024-01-03 ENCOUNTER — Ambulatory Visit (HOSPITAL_BASED_OUTPATIENT_CLINIC_OR_DEPARTMENT_OTHER): Payer: Medicare HMO

## 2024-03-27 ENCOUNTER — Other Ambulatory Visit: Payer: Self-pay | Admitting: *Deleted

## 2024-03-27 DIAGNOSIS — C186 Malignant neoplasm of descending colon: Secondary | ICD-10-CM

## 2024-03-28 ENCOUNTER — Other Ambulatory Visit

## 2024-03-28 ENCOUNTER — Ambulatory Visit: Admitting: Oncology

## 2024-07-24 ENCOUNTER — Telehealth: Payer: Self-pay | Admitting: *Deleted

## 2024-07-24 NOTE — Telephone Encounter (Signed)
 Called that she is ready to schedule lab/OV follow up with Dr. Cloretta. Scheduling message sent.

## 2024-07-25 ENCOUNTER — Telehealth: Payer: Self-pay | Admitting: Oncology

## 2024-07-25 NOTE — Telephone Encounter (Signed)
 Called PT to schedule lab and fu, voicemail box not set up.

## 2024-08-04 ENCOUNTER — Inpatient Hospital Stay: Admitting: Oncology

## 2024-08-04 ENCOUNTER — Inpatient Hospital Stay: Attending: Oncology

## 2024-08-04 ENCOUNTER — Telehealth: Payer: Self-pay | Admitting: Oncology

## 2024-08-04 ENCOUNTER — Encounter: Payer: Self-pay | Admitting: *Deleted

## 2024-08-04 NOTE — Progress Notes (Signed)
"  No show" for lab/OV today. Scheduling message sent to reschedule for ~ 1 month. 

## 2024-08-04 NOTE — Telephone Encounter (Signed)
 Called PT cell phone, disconnected, no answer on Home phone, left a voicemail on PT's daughter phone.

## 2024-08-23 ENCOUNTER — Inpatient Hospital Stay: Attending: Oncology

## 2024-08-23 ENCOUNTER — Inpatient Hospital Stay: Admitting: Oncology

## 2024-09-18 ENCOUNTER — Telehealth: Payer: Self-pay | Admitting: *Deleted

## 2024-09-18 ENCOUNTER — Telehealth: Payer: Self-pay | Admitting: Oncology

## 2024-09-18 NOTE — Telephone Encounter (Signed)
 Kelsey Bridges called to request medication for numbness in her toes that started ~ 2 months ago. They are not cold or discolored. Last chemo was 11/12/21 (FOLFOX) and MD stated it would be unlikely for this to start over 2 years later. Suggests she contact PCP to be evaluated. She agrees to this. Also will get her scheduled for her routine f/u later this month or January. Scheduling message sent.

## 2024-09-18 NOTE — Telephone Encounter (Signed)
 Spoke with PT about rescheduling appt, PT wants me to call her back in January. PT had concerns about her toes let PT know to call her PCP.

## 2024-09-22 ENCOUNTER — Emergency Department (HOSPITAL_COMMUNITY)

## 2024-09-22 ENCOUNTER — Inpatient Hospital Stay (HOSPITAL_COMMUNITY)
Admission: EM | Admit: 2024-09-22 | Discharge: 2024-09-29 | DRG: 304 | Disposition: A | Discharge status: Skilled Nursing Facility | Attending: Internal Medicine | Admitting: Internal Medicine

## 2024-09-22 DIAGNOSIS — E669 Obesity, unspecified: Secondary | ICD-10-CM | POA: Diagnosis present

## 2024-09-22 DIAGNOSIS — R471 Dysarthria and anarthria: Secondary | ICD-10-CM | POA: Diagnosis present

## 2024-09-22 DIAGNOSIS — I161 Hypertensive emergency: Principal | ICD-10-CM | POA: Diagnosis present

## 2024-09-22 DIAGNOSIS — Z91148 Patient's other noncompliance with medication regimen for other reason: Secondary | ICD-10-CM

## 2024-09-22 DIAGNOSIS — R1310 Dysphagia, unspecified: Secondary | ICD-10-CM | POA: Diagnosis present

## 2024-09-22 DIAGNOSIS — G8194 Hemiplegia, unspecified affecting left nondominant side: Secondary | ICD-10-CM | POA: Diagnosis present

## 2024-09-22 DIAGNOSIS — Z79899 Other long term (current) drug therapy: Secondary | ICD-10-CM

## 2024-09-22 DIAGNOSIS — I480 Paroxysmal atrial fibrillation: Secondary | ICD-10-CM | POA: Diagnosis present

## 2024-09-22 DIAGNOSIS — I6389 Other cerebral infarction: Secondary | ICD-10-CM | POA: Diagnosis present

## 2024-09-22 DIAGNOSIS — Z7901 Long term (current) use of anticoagulants: Secondary | ICD-10-CM

## 2024-09-22 DIAGNOSIS — R8290 Unspecified abnormal findings in urine: Secondary | ICD-10-CM | POA: Diagnosis present

## 2024-09-22 DIAGNOSIS — I169 Hypertensive crisis, unspecified: Secondary | ICD-10-CM

## 2024-09-22 DIAGNOSIS — R29704 NIHSS score 4: Secondary | ICD-10-CM | POA: Diagnosis present

## 2024-09-22 DIAGNOSIS — E785 Hyperlipidemia, unspecified: Secondary | ICD-10-CM | POA: Diagnosis present

## 2024-09-22 DIAGNOSIS — Z9049 Acquired absence of other specified parts of digestive tract: Secondary | ICD-10-CM

## 2024-09-22 DIAGNOSIS — Z90711 Acquired absence of uterus with remaining cervical stump: Secondary | ICD-10-CM

## 2024-09-22 DIAGNOSIS — I10 Essential (primary) hypertension: Secondary | ICD-10-CM | POA: Diagnosis present

## 2024-09-22 DIAGNOSIS — F419 Anxiety disorder, unspecified: Secondary | ICD-10-CM | POA: Diagnosis present

## 2024-09-22 DIAGNOSIS — Z85048 Personal history of other malignant neoplasm of rectum, rectosigmoid junction, and anus: Secondary | ICD-10-CM

## 2024-09-22 DIAGNOSIS — I4892 Unspecified atrial flutter: Secondary | ICD-10-CM | POA: Diagnosis present

## 2024-09-22 DIAGNOSIS — Z803 Family history of malignant neoplasm of breast: Secondary | ICD-10-CM

## 2024-09-22 DIAGNOSIS — Z880 Allergy status to penicillin: Secondary | ICD-10-CM

## 2024-09-22 DIAGNOSIS — F17210 Nicotine dependence, cigarettes, uncomplicated: Secondary | ICD-10-CM | POA: Diagnosis present

## 2024-09-22 DIAGNOSIS — R531 Weakness: Principal | ICD-10-CM

## 2024-09-22 DIAGNOSIS — Z8249 Family history of ischemic heart disease and other diseases of the circulatory system: Secondary | ICD-10-CM

## 2024-09-22 DIAGNOSIS — Z6832 Body mass index (BMI) 32.0-32.9, adult: Secondary | ICD-10-CM

## 2024-09-22 LAB — COMPREHENSIVE METABOLIC PANEL WITH GFR
ALT: 9 U/L (ref 0–44)
AST: 17 U/L (ref 15–41)
Albumin: 4 g/dL (ref 3.5–5.0)
Alkaline Phosphatase: 73 U/L (ref 38–126)
Anion gap: 10 (ref 5–15)
BUN: 21 mg/dL (ref 8–23)
CO2: 23 mmol/L (ref 22–32)
Calcium: 9.3 mg/dL (ref 8.9–10.3)
Chloride: 107 mmol/L (ref 98–111)
Creatinine, Ser: 1.23 mg/dL — ABNORMAL HIGH (ref 0.44–1.00)
GFR, Estimated: 46 mL/min — ABNORMAL LOW (ref >60)
Glucose, Bld: 103 mg/dL — ABNORMAL HIGH (ref 70–99)
Potassium: 4 mmol/L (ref 3.5–5.1)
Sodium: 140 mmol/L (ref 135–145)
Total Bilirubin: 0.5 mg/dL (ref 0.0–1.2)
Total Protein: 6.8 g/dL (ref 6.5–8.1)

## 2024-09-22 LAB — CBC WITH DIFFERENTIAL/PLATELET
Abs Immature Granulocytes: 0.01 K/uL (ref 0.00–0.07)
Basophils Absolute: 0 K/uL (ref 0.0–0.1)
Basophils Relative: 1 %
Eosinophils Absolute: 0.1 K/uL (ref 0.0–0.5)
Eosinophils Relative: 1 %
HCT: 38.7 % (ref 36.0–46.0)
Hemoglobin: 12.4 g/dL (ref 12.0–15.0)
Immature Granulocytes: 0 %
Lymphocytes Relative: 31 %
Lymphs Abs: 1.7 K/uL (ref 0.7–4.0)
MCH: 29.8 pg (ref 26.0–34.0)
MCHC: 32 g/dL (ref 30.0–36.0)
MCV: 93 fL (ref 80.0–100.0)
Monocytes Absolute: 0.4 K/uL (ref 0.1–1.0)
Monocytes Relative: 7 %
Neutro Abs: 3.3 K/uL (ref 1.7–7.7)
Neutrophils Relative %: 60 %
Platelets: 194 K/uL (ref 150–400)
RBC: 4.16 MIL/uL (ref 3.87–5.11)
RDW: 14.4 % (ref 11.5–15.5)
WBC: 5.4 K/uL (ref 4.0–10.5)
nRBC: 0 % (ref 0.0–0.2)

## 2024-09-22 NOTE — ED Triage Notes (Signed)
 Patient in today reporting ongoing weakness in legs reporting difficulty ambulating.

## 2024-09-23 ENCOUNTER — Inpatient Hospital Stay (HOSPITAL_COMMUNITY)

## 2024-09-23 ENCOUNTER — Emergency Department (HOSPITAL_COMMUNITY)

## 2024-09-23 DIAGNOSIS — I6340 Cerebral infarction due to embolism of unspecified cerebral artery: Secondary | ICD-10-CM | POA: Diagnosis not present

## 2024-09-23 DIAGNOSIS — E785 Hyperlipidemia, unspecified: Secondary | ICD-10-CM | POA: Diagnosis present

## 2024-09-23 DIAGNOSIS — Z6832 Body mass index (BMI) 32.0-32.9, adult: Secondary | ICD-10-CM | POA: Diagnosis not present

## 2024-09-23 DIAGNOSIS — I639 Cerebral infarction, unspecified: Secondary | ICD-10-CM | POA: Diagnosis present

## 2024-09-23 DIAGNOSIS — R531 Weakness: Secondary | ICD-10-CM | POA: Diagnosis present

## 2024-09-23 DIAGNOSIS — Z880 Allergy status to penicillin: Secondary | ICD-10-CM | POA: Diagnosis not present

## 2024-09-23 DIAGNOSIS — I161 Hypertensive emergency: Secondary | ICD-10-CM | POA: Diagnosis present

## 2024-09-23 DIAGNOSIS — Z79899 Other long term (current) drug therapy: Secondary | ICD-10-CM | POA: Diagnosis not present

## 2024-09-23 DIAGNOSIS — Z85048 Personal history of other malignant neoplasm of rectum, rectosigmoid junction, and anus: Secondary | ICD-10-CM | POA: Diagnosis not present

## 2024-09-23 DIAGNOSIS — I4891 Unspecified atrial fibrillation: Secondary | ICD-10-CM | POA: Diagnosis not present

## 2024-09-23 DIAGNOSIS — Z9049 Acquired absence of other specified parts of digestive tract: Secondary | ICD-10-CM | POA: Diagnosis not present

## 2024-09-23 DIAGNOSIS — Z91148 Patient's other noncompliance with medication regimen for other reason: Secondary | ICD-10-CM | POA: Diagnosis not present

## 2024-09-23 DIAGNOSIS — I69391 Dysphagia following cerebral infarction: Secondary | ICD-10-CM | POA: Diagnosis not present

## 2024-09-23 DIAGNOSIS — I4892 Unspecified atrial flutter: Secondary | ICD-10-CM | POA: Diagnosis present

## 2024-09-23 DIAGNOSIS — Z7901 Long term (current) use of anticoagulants: Secondary | ICD-10-CM | POA: Diagnosis not present

## 2024-09-23 DIAGNOSIS — F17200 Nicotine dependence, unspecified, uncomplicated: Secondary | ICD-10-CM | POA: Diagnosis not present

## 2024-09-23 DIAGNOSIS — Z90711 Acquired absence of uterus with remaining cervical stump: Secondary | ICD-10-CM | POA: Diagnosis not present

## 2024-09-23 DIAGNOSIS — R29704 NIHSS score 4: Secondary | ICD-10-CM | POA: Diagnosis present

## 2024-09-23 DIAGNOSIS — R1310 Dysphagia, unspecified: Secondary | ICD-10-CM | POA: Diagnosis present

## 2024-09-23 DIAGNOSIS — R29703 NIHSS score 3: Secondary | ICD-10-CM | POA: Diagnosis not present

## 2024-09-23 DIAGNOSIS — T461X6A Underdosing of calcium-channel blockers, initial encounter: Secondary | ICD-10-CM | POA: Diagnosis not present

## 2024-09-23 DIAGNOSIS — F419 Anxiety disorder, unspecified: Secondary | ICD-10-CM | POA: Diagnosis present

## 2024-09-23 DIAGNOSIS — Z8249 Family history of ischemic heart disease and other diseases of the circulatory system: Secondary | ICD-10-CM | POA: Diagnosis not present

## 2024-09-23 DIAGNOSIS — E669 Obesity, unspecified: Secondary | ICD-10-CM | POA: Diagnosis present

## 2024-09-23 DIAGNOSIS — R471 Dysarthria and anarthria: Secondary | ICD-10-CM | POA: Diagnosis present

## 2024-09-23 DIAGNOSIS — F17210 Nicotine dependence, cigarettes, uncomplicated: Secondary | ICD-10-CM | POA: Diagnosis present

## 2024-09-23 DIAGNOSIS — R8290 Unspecified abnormal findings in urine: Secondary | ICD-10-CM | POA: Diagnosis present

## 2024-09-23 DIAGNOSIS — Z803 Family history of malignant neoplasm of breast: Secondary | ICD-10-CM | POA: Diagnosis not present

## 2024-09-23 DIAGNOSIS — I10 Essential (primary) hypertension: Secondary | ICD-10-CM | POA: Diagnosis present

## 2024-09-23 DIAGNOSIS — G8194 Hemiplegia, unspecified affecting left nondominant side: Secondary | ICD-10-CM | POA: Diagnosis present

## 2024-09-23 DIAGNOSIS — I480 Paroxysmal atrial fibrillation: Secondary | ICD-10-CM | POA: Diagnosis present

## 2024-09-23 DIAGNOSIS — I6389 Other cerebral infarction: Secondary | ICD-10-CM | POA: Diagnosis present

## 2024-09-23 LAB — CBG MONITORING, ED
Glucose-Capillary: 93 mg/dL (ref 70–99)
Glucose-Capillary: 101 mg/dL — ABNORMAL HIGH (ref 70–99)
Glucose-Capillary: 106 mg/dL — ABNORMAL HIGH (ref 70–99)

## 2024-09-23 LAB — CBC
HCT: 41.5 % (ref 36.0–46.0)
HCT: 43.9 % (ref 36.0–46.0)
Hemoglobin: 13.4 g/dL (ref 12.0–15.0)
Hemoglobin: 14.5 g/dL (ref 12.0–15.0)
MCH: 29.6 pg (ref 26.0–34.0)
MCH: 30.3 pg (ref 26.0–34.0)
MCHC: 32.3 g/dL (ref 30.0–36.0)
MCHC: 33 g/dL (ref 30.0–36.0)
MCV: 91.8 fL (ref 80.0–100.0)
MCV: 91.6 fL (ref 80.0–100.0)
Platelets: 184 K/uL (ref 150–400)
Platelets: 191 K/uL (ref 150–400)
RBC: 4.52 MIL/uL (ref 3.87–5.11)
RBC: 4.79 MIL/uL (ref 3.87–5.11)
RDW: 14.5 % (ref 11.5–15.5)
RDW: 14.4 % (ref 11.5–15.5)
WBC: 4.9 K/uL (ref 4.0–10.5)
WBC: 6.6 K/uL (ref 4.0–10.5)
nRBC: 0 % (ref 0.0–0.2)
nRBC: 0 % (ref 0.0–0.2)

## 2024-09-23 LAB — TSH: TSH: 1.98 u[IU]/mL (ref 0.350–4.500)

## 2024-09-23 LAB — LIPID PANEL
Cholesterol: 221 mg/dL — ABNORMAL HIGH (ref 0–200)
HDL: 61 mg/dL (ref >40)
LDL Cholesterol: 144 mg/dL — ABNORMAL HIGH (ref 0–99)
Total CHOL/HDL Ratio: 3.6 ratio
Triglycerides: 78 mg/dL (ref <150)
VLDL: 16 mg/dL (ref 0–40)

## 2024-09-23 LAB — URINE DRUG SCREEN
Amphetamines: NEGATIVE
Barbiturates: NEGATIVE
Benzodiazepines: NEGATIVE
Cocaine: NEGATIVE
Fentanyl: NEGATIVE
Methadone Scn, Ur: NEGATIVE
Opiates: NEGATIVE
Tetrahydrocannabinol: NEGATIVE

## 2024-09-23 LAB — COMPREHENSIVE METABOLIC PANEL WITH GFR
ALT: 6 U/L (ref 0–44)
AST: 28 U/L (ref 15–41)
Albumin: 4.1 g/dL (ref 3.5–5.0)
Alkaline Phosphatase: 78 U/L (ref 38–126)
Anion gap: 12 (ref 5–15)
BUN: 13 mg/dL (ref 8–23)
CO2: 22 mmol/L (ref 22–32)
Calcium: 9.5 mg/dL (ref 8.9–10.3)
Chloride: 105 mmol/L (ref 98–111)
Creatinine, Ser: 0.99 mg/dL (ref 0.44–1.00)
GFR, Estimated: 59 mL/min — ABNORMAL LOW (ref >60)
Glucose, Bld: 103 mg/dL — ABNORMAL HIGH (ref 70–99)
Potassium: 3.9 mmol/L (ref 3.5–5.1)
Sodium: 139 mmol/L (ref 135–145)
Total Bilirubin: 0.7 mg/dL (ref 0.0–1.2)
Total Protein: 7.4 g/dL (ref 6.5–8.1)

## 2024-09-23 LAB — URINALYSIS, COMPLETE (UACMP) WITH MICROSCOPIC
Bilirubin Urine: NEGATIVE
Glucose, UA: NEGATIVE mg/dL
Ketones, ur: NEGATIVE mg/dL
Nitrite: POSITIVE — AB
Protein, ur: NEGATIVE mg/dL
Specific Gravity, Urine: 1.012 (ref 1.005–1.030)
pH: 6 (ref 5.0–8.0)

## 2024-09-23 LAB — GLUCOSE, CAPILLARY
Glucose-Capillary: 149 mg/dL — ABNORMAL HIGH (ref 70–99)
Glucose-Capillary: 101 mg/dL — ABNORMAL HIGH (ref 70–99)
Glucose-Capillary: 173 mg/dL — ABNORMAL HIGH (ref 70–99)

## 2024-09-23 LAB — HEMOGLOBIN A1C
Hgb A1c MFr Bld: 5.3 % (ref 4.8–5.6)
Mean Plasma Glucose: 105.41 mg/dL

## 2024-09-23 LAB — PHOSPHORUS: Phosphorus: 3.1 mg/dL (ref 2.5–4.6)

## 2024-09-23 LAB — MRSA NEXT GEN BY PCR, NASAL: MRSA by PCR Next Gen: NOT DETECTED

## 2024-09-23 LAB — PROTIME-INR
INR: 1 (ref 0.8–1.2)
Prothrombin Time: 14.2 s (ref 11.4–15.2)

## 2024-09-23 LAB — CREATININE, SERUM
Creatinine, Ser: 1.02 mg/dL — ABNORMAL HIGH (ref 0.44–1.00)
GFR, Estimated: 57 mL/min — ABNORMAL LOW (ref >60)

## 2024-09-23 LAB — MAGNESIUM: Magnesium: 2.4 mg/dL (ref 1.7–2.4)

## 2024-09-23 MED ORDER — ASPIRIN 81 MG PO TBEC
81.0000 mg | DELAYED_RELEASE_TABLET | Freq: Every day | ORAL | Status: DC
  Administered 2024-09-23 – 2024-09-24 (×2): 81 mg via ORAL
  Filled 2024-09-23 (×2): qty 1

## 2024-09-23 MED ORDER — DOCUSATE SODIUM 100 MG PO CAPS: 100.0000 mg | ORAL_CAPSULE | Freq: Two times a day (BID) | ORAL | Status: DC | PRN

## 2024-09-23 MED ORDER — HYDRALAZINE HCL 20 MG/ML IJ SOLN
10.0000 mg | Freq: Four times a day (QID) | INTRAMUSCULAR | Status: DC | PRN
  Administered 2024-09-25: 04:00:00 10 mg via INTRAVENOUS
  Filled 2024-09-23: qty 1

## 2024-09-23 MED ORDER — IOHEXOL 350 MG/ML SOLN
75.0000 mL | Freq: Once | INTRAVENOUS | Status: AC | PRN
  Administered 2024-09-23: 75 mL via INTRAVENOUS

## 2024-09-23 MED ORDER — ACETAMINOPHEN 160 MG/5ML PO SOLN
650.0000 mg | ORAL | Status: DC | PRN
  Filled 2024-09-23: qty 20.3

## 2024-09-23 MED ORDER — HEPARIN SODIUM (PORCINE) 5000 UNIT/ML IJ SOLN
5000.0000 [IU] | Freq: Three times a day (TID) | INTRAMUSCULAR | Status: DC
  Administered 2024-09-23: 5000 [IU] via SUBCUTANEOUS
  Filled 2024-09-23: qty 1

## 2024-09-23 MED ORDER — ACETAMINOPHEN 650 MG RE SUPP: 650.0000 mg | RECTAL | Status: DC | PRN

## 2024-09-23 MED ORDER — LABETALOL HCL 5 MG/ML IV SOLN: 10.0000 mg | INTRAVENOUS | Status: DC | PRN

## 2024-09-23 MED ORDER — NICARDIPINE HCL IN NACL 20-0.86 MG/200ML-% IV SOLN
0.0000 mg/h | INTRAVENOUS | Status: DC
  Administered 2024-09-23: 5 mg/h via INTRAVENOUS
  Filled 2024-09-23: qty 200

## 2024-09-23 MED ORDER — CHLORHEXIDINE GLUCONATE CLOTH 2 % EX PADS
6.0000 | MEDICATED_PAD | Freq: Every day | CUTANEOUS | Status: DC
  Administered 2024-09-24 – 2024-09-25 (×2): 6 via TOPICAL

## 2024-09-23 MED ORDER — NICARDIPINE HCL IN NACL 20-0.86 MG/200ML-% IV SOLN
3.0000 mg/h | INTRAVENOUS | Status: DC
  Administered 2024-09-23 (×2): 3 mg/h via INTRAVENOUS
  Filled 2024-09-23: qty 200

## 2024-09-23 MED ORDER — STROKE: EARLY STAGES OF RECOVERY BOOK
Freq: Once | Status: AC
  Administered 2024-09-24: 1
  Filled 2024-09-23: qty 1

## 2024-09-23 MED ORDER — ACETAMINOPHEN 10 MG/ML IV SOLN
1000.0000 mg | Freq: Four times a day (QID) | INTRAVENOUS | Status: AC | PRN
  Administered 2024-09-23: 1000 mg via INTRAVENOUS
  Filled 2024-09-23: qty 100

## 2024-09-23 MED ORDER — ATORVASTATIN CALCIUM 10 MG PO TABS
40.0000 mg | ORAL_TABLET | Freq: Every day | ORAL | Status: DC
  Administered 2024-09-23 – 2024-09-29 (×7): 40 mg via ORAL
  Filled 2024-09-23: qty 4
  Filled 2024-09-23: qty 1
  Filled 2024-09-23 (×4): qty 4
  Filled 2024-09-23: qty 1

## 2024-09-23 MED ORDER — POLYETHYLENE GLYCOL 3350 17 G PO PACK: 17.0000 g | PACK | Freq: Every day | ORAL | Status: DC | PRN

## 2024-09-23 MED ORDER — ACETAMINOPHEN 325 MG PO TABS
650.0000 mg | ORAL_TABLET | ORAL | Status: DC | PRN
  Administered 2024-09-29: 650 mg via ORAL
  Filled 2024-09-23 (×2): qty 2

## 2024-09-23 MED ORDER — SODIUM CHLORIDE 0.9 % IV SOLN: INTRAVENOUS | Status: AC

## 2024-09-23 MED ORDER — ORAL CARE MOUTH RINSE: 15.0000 mL | OROMUCOSAL | Status: DC | PRN

## 2024-09-23 MED ORDER — HEPARIN (PORCINE) 25000 UT/250ML-% IV SOLN
850.0000 [IU]/h | INTRAVENOUS | Status: DC
  Administered 2024-09-23: 850 [IU]/h via INTRAVENOUS
  Filled 2024-09-23: qty 250

## 2024-09-23 MED ORDER — INSULIN ASPART 100 UNIT/ML IJ SOLN
0.0000 [IU] | INTRAMUSCULAR | Status: DC
  Administered 2024-09-23: 1 [IU] via SUBCUTANEOUS
  Administered 2024-09-24: 2 [IU] via SUBCUTANEOUS
  Administered 2024-09-24: 1 [IU] via SUBCUTANEOUS
  Filled 2024-09-23: qty 2
  Filled 2024-09-23 (×2): qty 1

## 2024-09-23 NOTE — Consult Note (Signed)
 NEUROLOGY CONSULT NOTE   Date of service: September 23, 2024 Patient Name: Kelsey Bridges MRN:  [id number] DOB:  15-Mar-1951 Chief Complaint: left leg weakness and slurred speech Requesting Provider: Catherine Cools, MD  History of Present Illness  Kelsey Bridges is a 73 y.o. female with hx of essential hypertension, atrial fibrillation on Eliquis , nicotine dependence with current use, colorectal cancer stage IIIb s/p left colectomy 2022 and chemo who presented to [facility] 12/12 for evaluation of left leg weakness with difficulty ambulating and slurred speech.  Patient states that at around 3 PM on 12/12 she was suddenly unable to stand due to left leg weakness and slurred speech with a heavy feeling in her tongue and spillage of water from the left side of her mouth.  She also had significant hypertension on arrival up to 229/202.  Patient was started on Cardene  and an MRI was obtained revealing an acute infarct in the right posterior corona radiata and basal ganglia for which she was transferred to [facility].  Unclear why patient remained on Cardene  at OSH given ischemic stroke or admitted to ICU level of care.   LKW: 15:00 on 12/12 Modified rankin score: 0-Completely asymptomatic and back to baseline post- stroke IV Thrombolysis: No, patient on full dose Eliquis  at home EVT: No, presentation is not consistent with LVO  NIHSS components Score: Comment  1a Level of Conscious 0[x]  1[]  2[]  3[]      1b LOC Questions 0[x]  1[]  2[]       1c LOC Commands 0[x]  1[]  2[]       2 Best Gaze 0[x]  1[]  2[]       3 Visual 0[x]  1[]  2[]  3[]      4 Facial Palsy 0[]  1[x]  2[]  3[]      5a Motor Arm - left 0[x]  1[]  2[]  3[]  4[]  UN[]    5b Motor Arm - Right 0[x]  1[]  2[]  3[]  4[]  UN[]    6a Motor Leg - Left 0[x]  1[]  2[]  3[]  4[]  UN[]    6b Motor Leg - Right 0[x]  1[]  2[]  3[]  4[]  UN[]    7 Limb Ataxia 0[x]  1[]  2[]  UN[]      8 Sensory 0[]  1[x]  2[]  UN[]      9 Best Language 0[x]  1[]  2[]  3[]      10 Dysarthria 0[]  1[x]  2[]   UN[]      11 Extinct. and Inattention 0[x]  1[]  2[]       TOTAL:  3    ROS  Comprehensive ROS performed and pertinent positives documented in HPI   Past History   Past Medical History:  Diagnosis Date   Abnormal Pap smear    Allergy    Atrial fibrillation (HCC)    Dysrhythmia    Family history of breast cancer    Hammer toe    History of colon cancer    Hypertension    Internal hemorrhoids    Osteoarthritis    Past Surgical History:  Procedure Laterality Date   ABDOMINAL HYSTERECTOMY     COLONOSCOPY     FLEXIBLE SIGMOIDOSCOPY N/A 07/17/2021   Procedure: FLEXIBLE SIGMOIDOSCOPY;  Surgeon: Teresa Lonni HERO, MD;  Location: [facility];  Service: General;  Laterality: N/A;   HAMMER TOE SURGERY Right 2019   IR IMAGING GUIDED PORT INSERTION  08/26/2021   IR REMOVAL TUN ACCESS W/ PORT W/O FL MOD SED  05/27/2022   LAPAROSCOPIC SUPRACERVICAL HYSTERECTOMY     left ovary  10/12/1986   Varicose veins     Family History: Family History  Adopted: Yes  Problem Relation Age of  Onset   Cancer Sister        breast   Cancer Brother        liver   Social History  reports that she has been smoking cigarettes. Her smokeless tobacco use includes snuff. She reports current alcohol use. She reports that she does not use drugs.  Allergies[1]  Medications  Current Medications[2]  Vitals   Vitals:   09/23/24 1425 09/23/24 1430 09/23/24 1435 09/23/24 1440  BP: (!) 143/81 139/86    Pulse: 74 70 72 68  Resp: (!) 25 16 20 10   Temp:      TempSrc:      SpO2: 96% 96% 96% 95%  Weight:      Height:        Body mass index is 32.12 kg/m.  Physical Exam   Constitutional: Appears well-developed and well-nourished.  Psych: Affect appropriate to situation.  She is calm and cooperative with exam Eyes: No scleral injection.  HENT: No OP obstruction.  Head: Normocephalic.  Cardiovascular: Irregular rate and rhythm on bedside telemetry Respiratory: Effort normal, non-labored breathing on room  air GI: Soft.  No distension. There is no tenderness.  Skin: WDI.   Neurologic Examination  Mental Status: Patient is awake, alert, oriented to person, place, month, year, and situation. Patient is able to give a clear and coherent history. No signs of aphasia or neglect. She does have some mildly dysarthric speech.  Cranial Nerves: II: Visual Fields are full. Pupils are equal, round, and reactive to light.   III,IV, VI: EOMI without ptosis or diploplia.  V: Facial sensation is intact and symmetric to light touch VII: There is slight left mouth asymmetry at rest VIII: Hearing is intact to voice X: Palate elevates symmetrically XI: Shoulder shrug is symmetric. XII: Tongue protrudes midline  Motor: Tone is normal. Bulk is normal. 5/5 strength was present in all four extremities.  Bilateral upper and lower extremities elevate antigravity without vertical drift. Sensory: Decrease sensation to light touch on the left upper and lower extremity compared to the right. Cerebellar: FNF and HKS are intact bilaterally Gait: Not assessed  Labs/Imaging/Neurodiagnostic studies   CBC:  Recent Labs  Lab 10/15/2024 1922 09/23/24 0429 09/23/24 0843  WBC 5.4 4.9 6.6  NEUTROABS 3.3  --   --   HGB 12.4 13.4 14.5  HCT 38.7 41.5 43.9  MCV 93.0 91.8 91.6  PLT 194 184 191   Basic Metabolic Panel:  Lab Results  Component Value Date   NA 139 09/23/2024   K 3.9 09/23/2024   CO2 22 09/23/2024   GLUCOSE 103 (H) 09/23/2024   BUN 13 09/23/2024   CREATININE 0.99 09/23/2024   CALCIUM  9.5 09/23/2024   GFRNONAA 59 (L) 09/23/2024   GFRAA 84 06/28/2020   Lipid Panel:  Lab Results  Component Value Date   LDLCALC 144 (H) 09/23/2024   HgbA1c:  Lab Results  Component Value Date   HGBA1C 5.3 09/23/2024   Urine Drug Screen:     Component Value Date/Time   LABOPIA NEGATIVE 09/23/2024 0855   COCAINSCRNUR NEGATIVE 09/23/2024 0855   LABBENZ NEGATIVE 09/23/2024 0855   AMPHETMU NEGATIVE 09/23/2024  0855   THCU NEGATIVE 09/23/2024 0855   LABBARB NEGATIVE 09/23/2024 0855    Alcohol Level No results found for: [facility] INR  Lab Results  Component Value Date   INR 1.0 09/23/2024   CT Head without contrast (Personally reviewed): No acute intracranial abnormality  CT angio Head and Neck with contrast (Personally reviewed): No  emergent ELVO.  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  Emphysema.  MRI Brain (Personally reviewed): Acute infarct involving the posterior right corona radiata and basal ganglia.  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  ASSESSMENT   Kelsey Bridges is a 73 y.o. female with PMHx of HTN, atrial fibrillation on Eliquis , nicotine dependence with current use, colorectal cancer stage IIIb s/p left colectomy 2022 and chemotherapy presenting with left lower extremity weakness and slurred speech.  MRI imaging obtained at OSH revealed acute right BG infarct for which the patient was transferred to [facility] for further neurologic work up and evaluation.   RECOMMENDATIONS  - Frequent neuro checks - Echocardiogram - Prophylactic therapy- Antiplatelet med: Aspirin  - dose 81 mg PO  - Permissive hypertension, treat SBP > 180 mmHg given patient on full dose AC  - PRN hydralazine  and labetalol  for BP control  - Statin given LDL of 144 (above goal of < 70) - Risk factor modification - Telemetry monitoring - PT consult, OT consult, Speech consult - Stroke team to follow _________________________________________________________________  Signed, Mimi LELON Ny, NP [facility]   Attending Neurohospitalist Addendum Patient seen and examined with APP/Resident. Agree with the history and physical as documented above. Agree with the plan as documented, which I helped formulate. I have independently reviewed the chart, obtained history, review of systems and examined the patient.I have personally reviewed pertinent head/neck/spine  imaging (CT/MRI). D/W Dr. Catherine Please feel free to call with any questions.  -- Eligio Lav, MD Neurologist [facility] Pager: [phone]      [1]  Allergies Allergen Reactions   Penicillins Rash    Reaction to injection / does not react to tablets  [2]  Current Facility-Administered Medications:    [START ON 09/24/2024]  stroke: early stages of recovery book, , Does not apply, Once, Alghanim, Fahid, MD   0.9 %  sodium chloride  infusion, , Intravenous, Continuous, Alghanim, Fahid, MD, Last Rate: 40 mL/hr at 09/23/24 1257, Infusion Verify at 09/23/24 1257   acetaminophen  (OFIRMEV ) IV 1,000 mg, 1,000 mg, Intravenous, Q6H PRN, Alghanim, Fahid, MD, Last Rate: 400 mL/hr at 09/23/24 1350, 1,000 mg at 09/23/24 1350   acetaminophen  (TYLENOL ) tablet 650 mg, 650 mg, Oral, Q4H PRN **OR** acetaminophen  (TYLENOL ) 160 MG/5ML solution 650 mg, 650 mg, Per Tube, Q4H PRN **OR** acetaminophen  (TYLENOL ) suppository 650 mg, 650 mg, Rectal, Q4H PRN, Alghanim, Fahid, MD   aspirin  EC tablet 81 mg, 81 mg, Oral, Daily, Alghanim, Fahid, MD, 81 mg at 09/23/24 1255   atorvastatin  (LIPITOR) tablet 40 mg, 40 mg, Oral, Daily, Alghanim, Fahid, MD, 40 mg at 09/23/24 1255   Chlorhexidine  Gluconate Cloth 2 % PADS 6 each, 6 each, Topical, Daily, Stretch, Lamar PARAS, MD   docusate sodium  (COLACE) capsule 100 mg, 100 mg, Oral, BID PRN, Stretch, Lamar PARAS, MD   insulin  aspart (novoLOG ) injection 0-9 Units, 0-9 Units, Subcutaneous, Q4H, Stretch, Lamar PARAS, MD   nicardipine  (CARDENE ) 20mg  in 0.86% saline 200ml IV infusion (0.1 mg/ml), 3-15 mg/hr, Intravenous, Continuous, Alghanim, Fahid, MD, Last Rate: 30 mL/hr at 09/23/24 1257, 3 mg/hr at 09/23/24 1257   Oral care mouth rinse, 15 mL, Mouth Rinse, PRN, Deshunda Thackston, MD   polyethylene glycol (MIRALAX  / GLYCOLAX ) packet 17 g, 17 g, Oral, Daily PRN, Stretch, Lamar PARAS, MD

## 2024-09-23 NOTE — ED Notes (Signed)
 Pt passed swallow screen earlier in shift, however when RN provided oral meds, pt had difficulty swallowing and coughed medication up. Pt stated she wants to try again and was able to swallow. Will notify provider.

## 2024-09-23 NOTE — ED Notes (Signed)
 [facility] called.

## 2024-09-23 NOTE — ED Notes (Signed)
 MRI otw to pick pt up for scan

## 2024-09-23 NOTE — ED Notes (Signed)
 Updating vitals, Dr at bedside.

## 2024-09-23 NOTE — ED Notes (Addendum)
 Cardine, Heparin  stopped at 1128 per MD for MRI

## 2024-09-23 NOTE — ED Notes (Signed)
 Attempted to call [facility] to provide report, no answer, will try again later.

## 2024-09-23 NOTE — ED Notes (Signed)
 Pt is getting MRI at [place] performed then will work on transfer to [facility].

## 2024-09-23 NOTE — ED Provider Notes (Signed)
 [place] EMERGENCY DEPARTMENT AT [facility] Provider Note   CSN: [id number] Arrival date & time: 09/22/24  1806     Patient presents with: Weakness   Kelsey Bridges is a 73 y.o. female.   The history is provided by the patient.  Weakness Severity:  Severe Onset quality:  Sudden Duration:  1 day Timing:  Constant Progression:  Unchanged Chronicity:  New Context: recent infection   Context: not stress and not urinary tract infection   Relieved by:  Nothing Worsened by:  Nothing Ineffective treatments:  None tried Associated symptoms: difficulty walking   Associated symptoms: no ataxia, no chest pain, no dizziness, no drooling, no dysuria, no falls, no fever, no seizures, no shortness of breath, no syncope, no vision change and no vomiting   Patient with AFIB and HTN on Eliquis  present with weakness L>>> R lower extremity and inability to walk secondary to weakness since awakening on Friday AM.  Has a walker but hadn't had to use it until Friday.  No changes in vision or speech or cognition.  Is being evaluated for numbness in all toes.      Past Medical History:  Diagnosis Date   Abnormal Pap smear    Allergy    Atrial fibrillation (HCC)    Dysrhythmia    Family history of breast cancer    Hammer toe    History of colon cancer    Hypertension    Internal hemorrhoids    Osteoarthritis      Prior to Admission medications  Medication Sig Start Date End Date Taking? Authorizing Provider  ELIQUIS  5 MG TABS tablet TAKE 1 TABLET TWICE DAILY 11/24/22  Yes Court Dorn PARAS, MD  ferrous sulfate  325 (65 FE) MG tablet Take 325 mg by mouth every morning.   Yes [provider]  losartan  (COZAAR ) 100 MG tablet Take 100 mg by mouth every morning. 05/24/21  Yes [provider]  augmented betamethasone dipropionate (DIPROLENE-AF) 0.05 % ointment Apply topically daily. Patient not taking: Reported on 09/23/2024 01/15/23   [provider]   diltiazem  (CARDIZEM  CD) 240 MG 24 hr capsule TAKE 1 CAPSULE EVERY DAY Patient not taking: Reported on 09/23/2024 06/25/22   Emelia Josefa HERO, NP  meloxicam (MOBIC) 15 MG tablet Take 15 mg by mouth daily as needed (knee pain). Patient not taking: Reported on 09/23/2024    [provider]  mirabegron ER (MYRBETRIQ) 25 MG TB24 tablet     [provider]  mirabegron ER (MYRBETRIQ) 50 MG TB24 tablet     [provider]  nitrofurantoin, macrocrystal-monohydrate, (MACROBID) 100 MG capsule Take 100 mg by mouth 2 (two) times daily. Patient not taking: Reported on 09/23/2024 12/30/22   [provider]  potassium chloride  SA (KLOR-CON  M) 20 MEQ tablet Take 1 tablet (20 mEq total) by mouth daily. Patient not taking: Reported on 09/23/2024 10/01/21   Debby Olam POUR, NP  prochlorperazine  (COMPAZINE ) 10 MG tablet Take 1 tablet (10 mg total) by mouth every 6 (six) hours as needed for nausea or vomiting. Patient not taking: Reported on 10/23/2022 08/19/21   Cloretta Arley NOVAK, MD  sertraline  (ZOLOFT ) 100 MG tablet Take 100 mg by mouth daily as needed (anxiety). Patient not taking: Reported on 09/23/2024    [provider]    Allergies: Penicillins    Review of Systems  Unable to perform ROS: Acuity of condition  Constitutional:  Negative for fever.  HENT:  Negative for drooling.   Respiratory:  Negative for shortness of breath.   Cardiovascular:  Negative for chest pain and syncope.  Gastrointestinal:  Negative for vomiting.  Genitourinary:  Negative for dysuria.  Musculoskeletal:  Negative for falls.  Neurological:  Positive for weakness. Negative for dizziness and seizures.    Updated Vital Signs BP (!) 172/88   Pulse 85   Temp 97.7 F (36.5 C)   Resp 12   SpO2 100%   Physical Exam Vitals and nursing note reviewed.  Constitutional:      General: She is not in acute distress.    Appearance: Normal appearance. She is well-developed.  HENT:     Head:  Normocephalic and atraumatic.     Nose: Nose normal.     Mouth/Throat:     Pharynx: Oropharynx is clear.  Eyes:     Extraocular Movements: Extraocular movements intact.     Pupils: Pupils are equal, round, and reactive to light.  Cardiovascular:     Rate and Rhythm: Normal rate and regular rhythm.     Pulses: Normal pulses.     Heart sounds: Normal heart sounds.  Pulmonary:     Effort: Pulmonary effort is normal. No respiratory distress.     Breath sounds: Normal breath sounds.  Abdominal:     General: Bowel sounds are normal. There is no distension.     Palpations: Abdomen is soft.     Tenderness: There is no abdominal tenderness. There is no guarding or rebound.  Genitourinary:    Vagina: Vaginal discharge present.  Musculoskeletal:        General: Normal range of motion.     Cervical back: Neck supple.  Skin:    General: Skin is warm and dry.     Capillary Refill: Capillary refill takes less than 2 seconds.     Findings: No erythema or rash.  Neurological:     General: No focal deficit present.     Mental Status: She is alert.     Cranial Nerves: No cranial nerve deficit.     Sensory: No sensory deficit.     Deep Tendon Reflexes: Reflexes normal.     Comments: LLE 5-/5  Psychiatric:        Mood and Affect: Mood normal.     (all labs ordered are listed, but only abnormal results are displayed) Results for orders placed or performed during the hospital encounter of 09/22/24  CBC with Differential   Collection Time: 09/22/24  7:22 PM  Result Value Ref Range   WBC 5.4 4.0 - 10.5 K/uL   RBC 4.16 3.87 - 5.11 MIL/uL   Hemoglobin 12.4 12.0 - 15.0 g/dL   HCT 61.2 63.9 - 53.9 %   MCV 93.0 80.0 - 100.0 fL   MCH 29.8 26.0 - 34.0 pg   MCHC 32.0 30.0 - 36.0 g/dL   RDW 85.5 88.4 - 84.4 %   Platelets 194 150 - 400 K/uL   nRBC 0.0 0.0 - 0.2 %   Neutrophils Relative % 60 %   Neutro Abs 3.3 1.7 - 7.7 K/uL   Lymphocytes Relative 31 %   Lymphs Abs 1.7 0.7 - 4.0 K/uL    Monocytes Relative 7 %   Monocytes Absolute 0.4 0.1 - 1.0 K/uL   Eosinophils Relative 1 %   Eosinophils Absolute 0.1 0.0 - 0.5 K/uL   Basophils Relative 1 %   Basophils Absolute 0.0 0.0 - 0.1 K/uL   Immature Granulocytes 0 %   Abs Immature Granulocytes 0.01 0.00 - 0.07 K/uL  Comprehensive metabolic panel   Collection Time: 09/22/24  7:22 PM  Result Value Ref Range   Sodium 140 135 - 145 mmol/L   Potassium 4.0 3.5 - 5.1 mmol/L   Chloride 107 98 - 111 mmol/L   CO2 23 22 - 32 mmol/L   Glucose, Bld 103 (H) 70 - 99 mg/dL   BUN 21 8 - 23 mg/dL   Creatinine, Ser 8.76 (H) 0.44 - 1.00 mg/dL   Calcium  9.3 8.9 - 10.3 mg/dL   Total Protein 6.8 6.5 - 8.1 g/dL   Albumin 4.0 3.5 - 5.0 g/dL   AST 17 15 - 41 U/L   ALT 9 0 - 44 U/L   Alkaline Phosphatase 73 38 - 126 U/L   Total Bilirubin 0.5 0.0 - 1.2 mg/dL   GFR, Estimated 46 (L) >60 mL/min   Anion gap 10 5 - 15  CBG monitoring, ED   Collection Time: 09/23/24  4:23 AM  Result Value Ref Range   Glucose-Capillary 93 70 - 99 mg/dL   CT ANGIO HEAD NECK W WO CM Result Date: 09/23/2024 EXAM: CTA HEAD AND NECK WITHOUT AND WITH 09/23/2024 12:31:29 AM TECHNIQUE: CTA of the head and neck was performed without and with the administration of 75 mL of iohexol  (OMNIPAQUE ) 350 MG/ML injection. Multiplanar 2D and/or 3D reformatted images are provided for review. Automated exposure control, iterative reconstruction, and/or weight based adjustment of the mA/kV was utilized to reduce the radiation dose to as low as reasonably achievable. Stenosis of the internal carotid arteries measured using NASCET criteria. COMPARISON: None available CLINICAL HISTORY: Syncope/presyncope, cerebrovascular cause suspected. FINDINGS: CTA NECK: AORTIC ARCH AND ARCH VESSELS: No dissection or arterial injury. No significant stenosis of the brachiocephalic or subclavian arteries. CERVICAL CAROTID ARTERIES: No dissection, arterial injury, or hemodynamically significant stenosis by NASCET  criteria. CERVICAL VERTEBRAL ARTERIES: No dissection, arterial injury, or significant stenosis. LUNGS AND MEDIASTINUM: Pulmonary emphysema. SOFT TISSUES: No acute abnormality. BONES: No acute abnormality. CTA HEAD: ANTERIOR CIRCULATION: No significant stenosis of the internal carotid arteries. No significant stenosis of the anterior cerebral arteries. No significant stenosis of the middle cerebral arteries. No aneurysm. POSTERIOR CIRCULATION: Moderate stenosis of the right PCA distal P1 segment. No significant stenosis of the left posterior cerebral artery. No significant stenosis of the basilar artery. No significant stenosis of the vertebral arteries. No aneurysm. OTHER: No dural venous sinus thrombosis on this non-dedicated study. IMPRESSION: 1. No emergent large vessel occlusion. 2. 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 3. Emphysema. Electronically signed by: Franky Stanford MD 09/23/2024 12:42 AM EST RP Workstation: [id number]   CT Head Wo Contrast Result Date: 09/22/2024 EXAM: CT HEAD WITHOUT 09/22/2024 11:19:00 PM TECHNIQUE: CT of the head was performed without the administration of intravenous contrast. Automated exposure control, iterative reconstruction, and/or weight based adjustment of the mA/kV was utilized to reduce the radiation dose to as low as reasonably achievable. COMPARISON: None available. CLINICAL HISTORY: Headache, neuro deficit FINDINGS: BRAIN AND VENTRICLES: No acute intracranial hemorrhage. No mass effect or midline shift. No extra-axial fluid collection. No evidence of acute infarct. No hydrocephalus. ORBITS: No acute abnormality. SINUSES AND MASTOIDS: Opacification of a few right mastoid air cells. Paranasal sinuses are well aerated. SOFT TISSUES AND SKULL: No acute skull fracture. No acute soft tissue abnormality. IMPRESSION: 1. No acute intracranial abnormality. Electronically signed by: Norman Gatlin MD 09/22/2024 11:24 PM EST RP Workstation: [id number]     EKG: None  Radiology: CT ANGIO HEAD NECK W WO CM Result  Date: 09/23/2024 EXAM: CTA HEAD AND NECK WITHOUT AND WITH 09/23/2024 12:31:29 AM TECHNIQUE: CTA of the head and neck was performed without and with the administration of 75 mL of iohexol  (OMNIPAQUE ) 350 MG/ML injection. Multiplanar 2D and/or 3D reformatted images are provided for review. Automated exposure control, iterative reconstruction, and/or weight based adjustment of the mA/kV was utilized to reduce the radiation dose to as low as reasonably achievable. Stenosis of the internal carotid arteries measured using NASCET criteria. COMPARISON: None available CLINICAL HISTORY: Syncope/presyncope, cerebrovascular cause suspected. FINDINGS: CTA NECK: AORTIC ARCH AND ARCH VESSELS: No dissection or arterial injury. No significant stenosis of the brachiocephalic or subclavian arteries. CERVICAL CAROTID ARTERIES: No dissection, arterial injury, or hemodynamically significant stenosis by NASCET criteria. CERVICAL VERTEBRAL ARTERIES: No dissection, arterial injury, or significant stenosis. LUNGS AND MEDIASTINUM: Pulmonary emphysema. SOFT TISSUES: No acute abnormality. BONES: No acute abnormality. CTA HEAD: ANTERIOR CIRCULATION: No significant stenosis of the internal carotid arteries. No significant stenosis of the anterior cerebral arteries. No significant stenosis of the middle cerebral arteries. No aneurysm. POSTERIOR CIRCULATION: Moderate stenosis of the right PCA distal P1 segment. No significant stenosis of the left posterior cerebral artery. No significant stenosis of the basilar artery. No significant stenosis of the vertebral arteries. No aneurysm. OTHER: No dural venous sinus thrombosis on this non-dedicated study. IMPRESSION: 1. No emergent large vessel occlusion. 2. 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 3. Emphysema. Electronically signed by: Franky Stanford MD 09/23/2024 12:42 AM EST RP Workstation: [id number]   CT Head Wo  Contrast Result Date: 09/22/2024 EXAM: CT HEAD WITHOUT 09/22/2024 11:19:00 PM TECHNIQUE: CT of the head was performed without the administration of intravenous contrast. Automated exposure control, iterative reconstruction, and/or weight based adjustment of the mA/kV was utilized to reduce the radiation dose to as low as reasonably achievable. COMPARISON: None available. CLINICAL HISTORY: Headache, neuro deficit FINDINGS: BRAIN AND VENTRICLES: No acute intracranial hemorrhage. No mass effect or midline shift. No extra-axial fluid collection. No evidence of acute infarct. No hydrocephalus. ORBITS: No acute abnormality. SINUSES AND MASTOIDS: Opacification of a few right mastoid air cells. Paranasal sinuses are well aerated. SOFT TISSUES AND SKULL: No acute skull fracture. No acute soft tissue abnormality. IMPRESSION: 1. No acute intracranial abnormality. Electronically signed by: Norman Gatlin MD 09/22/2024 11:24 PM EST RP Workstation: [id number]     .Critical Care  Performed by: Nettie Earing, MD Authorized by: Nettie Earing, MD   Critical care provider statement:    Critical care time (minutes):  60   Critical care end time:  09/23/2024 4:41 AM   Critical care was necessary to treat or prevent imminent or life-threatening deterioration of the following conditions:  CNS failure or compromise and circulatory failure   Critical care was time spent personally by me on the following activities:  Development of treatment plan with patient or surrogate, discussions with consultants, evaluation of patient's response to treatment, examination of patient, ordering and review of laboratory studies, ordering and review of radiographic studies, ordering and performing treatments and interventions, pulse oximetry, re-evaluation of patient's condition and review of old charts   I assumed direction of critical care for this patient from another provider in my specialty: no     Care discussed with: admitting  provider      Medications Ordered in the ED  Chlorhexidine  Gluconate Cloth 2 % PADS 6 each (has no administration in time range)  docusate sodium  (COLACE) capsule 100 mg (has no administration in time range)  polyethylene glycol (MIRALAX  /  GLYCOLAX ) packet 17 g (has no administration in time range)  insulin  aspart (novoLOG ) injection 0-9 Units (has no administration in time range)  nicardipine  (CARDENE ) 20mg  in 0.86% saline 200ml IV infusion (0.1 mg/ml) (has no administration in time range)  heparin  injection 5,000 Units (has no administration in time range)  iohexol  (OMNIPAQUE ) 350 MG/ML injection 75 mL (75 mLs Intravenous Contrast Given 09/23/24 0028)                                    Medical Decision Making Weakness in legs L> R LSN Thursday night   Amount and/or Complexity of Data Reviewed External Data Reviewed: notes.    Details: Previous notes reviewed  Labs: ordered.    Details: Normal white count 5.4, normal hemoglobin 12.4 normal creatinine normal sodium 140, normal potassium 4, normal creatinine  normal white count 5.4, normal hemoglobin 12.4, normal platelets.   Radiology: ordered and independent interpretation performed.    Details: No ICH by me on CTA Discussion of management or test interpretation with external provider(s): 230 AM case d/w Dr. Lindzen, can be admitted there BP control and MRI of the brain can consult if CVA.    Risk Prescription drug management. Decision regarding hospitalization. Risk Details: Outside the window for stroke as LSN was Thursday prior to bed.  Moreover patient is on Eliquis  and is complaint.  Cardene  started for uncontrolled ABP in the setting of neuro symptoms.  ICU to admit     Final diagnoses:  Weakness  Hypertensive crisis   The patient appears reasonably stabilized for admission considering the current resources, flow, and capabilities available in the ED at this time, and I doubt any other [facility] requiring further screening  and/or treatment in the ED prior to admission.  ED Discharge Orders     None          Tracyann Duffell, MD 09/23/24 [phone]

## 2024-09-23 NOTE — Progress Notes (Addendum)
 PHARMACY - ANTICOAGULATION CONSULT NOTE  Pharmacy Consult for IV heparin   Indication: acute ischemic stroke   Allergies[1]  Patient Measurements: Height: 5' 1 (154.9 cm) Weight: 77.1 kg (170 lb) IBW/kg (Calculated) : 47.8 HEPARIN  DW (KG): 65  Vital Signs: Temp: 98 F (36.7 C) (12/13 0743) Temp Source: Oral (12/13 0743) BP: 148/91 (12/13 0830) Pulse Rate: 77 (12/13 0830)  Labs: Recent Labs    09/22/24 1922 09/23/24 0429  HGB 12.4 13.4  HCT 38.7 41.5  PLT 194 184  CREATININE 1.23* 1.02*    Estimated Creatinine Clearance: 45.5 mL/min (A) (by C-G formula based on SCr of 1.02 mg/dL (H)).   Medical History: Past Medical History:  Diagnosis Date   Abnormal Pap smear    Allergy    Atrial fibrillation (HCC)    Dysrhythmia    Family history of breast cancer    Hammer toe    History of colon cancer    Hypertension    Internal hemorrhoids    Osteoarthritis     Medications:  (Not in a hospital admission)   Assessment: Pharmacy is consulted to start heparin  drip on 73 yo female with acute ischemic stroke. Pt has been on apixaban  5 mg PO BID PTA for afib with LD being on 12/12 at 0700 per med rec. Discussed with Dr. Nance about targeting lower range since pt diagnosed with ischemic stroke or for afib dosing. MD states to dose for lower target goal for ischemic stroke.    Today, 09/23/2024 Hgb 14.5, plt 191  SCr 0.99 mg/dl, CrCl 47 ml/min Anticipate will monitor aPTT for now until anti-Xa and aPTT correlate due to recent  apixaban  use  Of note, pt did receive 5000 unit SQ ppx dose of heparin  this AM at 0550    Goal of Therapy:  Heparin  level 0.3-0.5 units/ml aPTT 66-85 sec  Monitor platelets by anticoagulation protocol: Yes   Plan:  Due to recent apixaban  use and SQ heparin  dose, will avoid bolus  Start heparin  infusion at 850 units/hr Check aPTT, anti -Xa in 8 hours after start of infusion  Daily CBC while on heparin   Continue to monitor H&H and  platelets   Dolphus Roller, PharmD, BCPS 09/23/2024 9:36 AM   ADDENDUM - Hold heparin  for now per neuro recommendations   Dolphus Roller, PharmD, BCPS 09/23/2024 12:24 PM        [1]  Allergies Allergen Reactions   Penicillins Rash    Reaction to injection / does not react to tablets

## 2024-09-23 NOTE — ED Notes (Signed)
 Consult to neurology @ [phone]

## 2024-09-23 NOTE — ED Notes (Signed)
Pt passes swallow screen.

## 2024-09-23 NOTE — ED Notes (Signed)
 Hold PO meds until SLP can eval per MD

## 2024-09-23 NOTE — H&P (Signed)
 NAME:  Kelsey Bridges, MRN:  [id number], DOB:  [birth year], LOS: 0 ADMISSION DATE:  09/22/2024, CONSULTATION DATE:  09/23/2024 REFERRING MD:  ED, CHIEF COMPLAINT:  L leg weakness   History of Present Illness:  Kelsey Bridges is a 73 y/o F with a PMH significant for HTN, atrial fibrillation on Eliquis , nicotine dependence with current use, colorectal cancer stage III b s/p left collectomy 07/17/2021 and chemoRx who presents for acute onset left lower extremity weakness and dysarthria. Last known normal 3 PM on 09/22/2024.  Around 3 PM yesterday, she experienced an inability to stand due to weakness in the left leg whic is new for her. She does have a hx of numbness in the left foot and toes, which has persisted for a couple of months. She also experienced slurred speech, describing her tongue as feeling heavy and obstructive. No previous episodes of similar weakness or slurred speech were reported. She also noted that after drinking, she noticed water coming down her left lip.  She has a history of atrial fibrillation, managed with Eliquis  taken twice daily, and hypertension managed with losartan  100 mg daily. Upon arrival at the emergency department, she was told her blood pressure was very high, which scared her. She also has a history of bowel cancer, treated with surgery and chemotherapy approximately one to two years ago, with no current issues related to bowel cancer.  In terms of social history, she is a current smoker. She denies any family history of stroke but mentions a father-in-law with a heart condition. During the review of symptoms, she reported a headache at the onset of her symptoms and visual disturbances described as 'shadows of purple' when looking at the TV. No other significant medical problems aside from those mentioned.  Pertinent  Medical History   Past Medical History:  Diagnosis Date   Abnormal Pap smear    Allergy    Atrial fibrillation (HCC)    Dysrhythmia     Family history of breast cancer    Hammer toe    History of colon cancer    Hypertension    Internal hemorrhoids    Osteoarthritis     Significant Hospital Events: Including procedures, antibiotic start and stop dates in addition to other pertinent events   12/12 --> ED for stroke like symtoms  Interim History / Subjective:  As above  Objective    Blood pressure (!) 158/84, pulse 76, temperature 98 F (36.7 C), temperature source Oral, resp. rate 19, SpO2 100%.        Intake/Output Summary (Last 24 hours) at 09/23/2024 0803 Last data filed at 09/23/2024 0750 Gross per 24 hour  Intake 160.49 ml  Output --  Net 160.49 ml   There were no vitals filed for this visit.  Examination: NIH stroke scale 4 on my evaluation General: well appearing, no distress HENT: anicteric sclera, well injected conjunctivae, oral and nasal mucosa normal Lungs: CTAB Cardiovascular: irregular rate and rhythm Abdomen: soft, non-tender, vertical surgical scar present, well healing Extremities: no significant edema Neuro: PERRL, EOMI, visual field normal, mild left lip droop, other CN normal. Mild pronator drift in LUE and LLE. Motor strength 4+/5 in LUE, 5/5 in RUE, 3+/5 LLE, 5/5 in RLE. Sensation decreased in left arm and leg. Did not test coordination GU: deferred  Resolved problem list   Assessment and Plan   #Acute Ischemic Stroke: > Patient presented with left leg weakness as well as slurred speech. NIH SS is 4. CT head ruled out  ICH. CTA head and neck with moderate stenosis of R PCA distal P1 segment. MRI Brain is pending at this time. The patient did not get lytics due to being on Eliquis  for atrial fibrillation. She was started on nicardipine  drip for hypertensive emergency with goal systolic 160 - 180.  - Permissive hypertension, goal SBP < 220 due to concern for stroke - MRI Brain - TTE with bubble - Lipid panel, A1c, TSH - Neuro consult - Will admit to Neuro ICU for work up of  stroke and titration of antihypertensives - PT/OT  #Hypertensive Emergency: - Continue nicardipine  drip - SBP goal < 220 mmHg  #Atrial Fibrillation: - Heparin  drip per pharmacy  #Diet: - NPO for now - Speech eval  Patient Lines/Drains/Airways Status     Active Line/Drains/Airways     Name Placement date Placement time Site Days   Peripheral IV 09/22/24 20 G Right Antecubital 09/22/24  2347  Antecubital  1            Labs   CBC: Recent Labs  Lab 09/22/24 1922 09/23/24 0429  WBC 5.4 4.9  NEUTROABS 3.3  --   HGB 12.4 13.4  HCT 38.7 41.5  MCV 93.0 91.8  PLT 194 184    Basic Metabolic Panel: Recent Labs  Lab 09/22/24 1922 09/23/24 0429  NA 140  --   K 4.0  --   CL 107  --   CO2 23  --   GLUCOSE 103*  --   BUN 21  --   CREATININE 1.23* 1.02*  CALCIUM  9.3  --    GFR: CrCl cannot be calculated (Unknown ideal weight.). Recent Labs  Lab 09/22/24 1922 09/23/24 0429  WBC 5.4 4.9    Liver Function Tests: Recent Labs  Lab 09/22/24 1922  AST 17  ALT 9  ALKPHOS 73  BILITOT 0.5  PROT 6.8  ALBUMIN 4.0   No results for input(s): LIPASE, AMYLASE in the last 168 hours. No results for input(s): AMMONIA in the last 168 hours.  ABG    Component Value Date/Time   TCO2 24 02/07/2014 1318     Coagulation Profile: No results for input(s): INR, PROTIME in the last 168 hours.  Cardiac Enzymes: No results for input(s): CKTOTAL, CKMB, CKMBINDEX, TROPONINI in the last 168 hours.  HbA1C: Hgb A1c MFr Bld  Date/Time Value Ref Range Status  07/07/2021 03:24 PM 5.6 4.8 - 5.6 % Final    Comment:    (NOTE) Pre diabetes:          5.7%-6.4%  Diabetes:              >6.4%  Glycemic control for   <7.0% adults with diabetes     CBG: Recent Labs  Lab 09/23/24 0423  GLUCAP 93    Review of Systems:   Not obtained  Past Medical History:  She,  has a past medical history of Abnormal Pap smear, Allergy, Atrial fibrillation (HCC),  Dysrhythmia, Family history of breast cancer, Hammer toe, History of colon cancer, Hypertension, Internal hemorrhoids, and Osteoarthritis.   Surgical History:   Past Surgical History:  Procedure Laterality Date   ABDOMINAL HYSTERECTOMY     COLONOSCOPY     FLEXIBLE SIGMOIDOSCOPY N/A 07/17/2021   Procedure: FLEXIBLE SIGMOIDOSCOPY;  Surgeon: Teresa Lonni HERO, MD;  Location: [facility];  Service: General;  Laterality: N/A;   HAMMER TOE SURGERY Right 2019   IR IMAGING GUIDED PORT INSERTION  08/26/2021   IR REMOVAL TUN ACCESS W/ PORT W/O  FL MOD SED  05/27/2022   LAPAROSCOPIC SUPRACERVICAL HYSTERECTOMY     left ovary  10/12/1986   Varicose veins       Social History:   reports that she has been smoking cigarettes. Her smokeless tobacco use includes snuff. She reports current alcohol use. She reports that she does not use drugs.   Family History:  Her family history includes Cancer in her brother and sister. She was adopted.   Allergies Allergies[1]   Home Medications  Prior to Admission medications  Medication Sig Start Date End Date Taking? Authorizing Provider  ELIQUIS  5 MG TABS tablet TAKE 1 TABLET TWICE DAILY 11/24/22  Yes Court Dorn PARAS, MD  ferrous sulfate  325 (65 FE) MG tablet Take 325 mg by mouth every morning.   Yes [provider]  losartan  (COZAAR ) 100 MG tablet Take 100 mg by mouth every morning. 05/24/21  Yes [provider]  augmented betamethasone dipropionate (DIPROLENE-AF) 0.05 % ointment Apply topically daily. Patient not taking: Reported on 09/23/2024 01/15/23   [provider]  diltiazem  (CARDIZEM  CD) 240 MG 24 hr capsule TAKE 1 CAPSULE EVERY DAY Patient not taking: Reported on 09/23/2024 06/25/22   Emelia Josefa HERO, NP  meloxicam (MOBIC) 15 MG tablet Take 15 mg by mouth daily as needed (knee pain). Patient not taking: Reported on 09/23/2024    [provider]  mirabegron ER (MYRBETRIQ) 25 MG TB24 tablet     [provider]  mirabegron ER (MYRBETRIQ) 50 MG TB24 tablet     [provider]  nitrofurantoin, macrocrystal-monohydrate, (MACROBID) 100 MG capsule Take 100 mg by mouth 2 (two) times daily. Patient not taking: Reported on 09/23/2024 12/30/22   [provider]  potassium chloride  SA (KLOR-CON  M) 20 MEQ tablet Take 1 tablet (20 mEq total) by mouth daily. Patient not taking: Reported on 09/23/2024 10/01/21   Debby Olam POUR, NP  prochlorperazine  (COMPAZINE ) 10 MG tablet Take 1 tablet (10 mg total) by mouth every 6 (six) hours as needed for nausea or vomiting. Patient not taking: Reported on 10/23/2022 08/19/21   Cloretta Arley NOVAK, MD  sertraline  (ZOLOFT ) 100 MG tablet Take 100 mg by mouth daily as needed (anxiety). Patient not taking: Reported on 09/23/2024    [provider]     Due to a high probability of clinically significant, life threatening deterioration, the patient required my highest level of preparedness to intervene emergently and I personally spent this critical care time directly and personally managing the patient. This critical care time included obtaining a history; examining the patient; pulse oximetry; ordering and review of studies; arranging urgent treatment with development of a management plan; evaluation of patient's response to treatment; frequent reassessment; and, discussions with other providers.  This critical care time was performed to assess and manage the high probability of imminent, life-threatening deterioration that could result in multi-organ failure. It was exclusive of separately billable procedures and treating other patients and teaching time. Critical Care Time: 60 minutes.  Paula Southerly, MD [facility]      [1]  Allergies Allergen Reactions   Penicillins Rash    Reaction to injection / does not react to tablets

## 2024-09-23 NOTE — ED Notes (Signed)
 Pt returned from MRI

## 2024-09-23 NOTE — ED Notes (Signed)
 ED TO INPATIENT HANDOFF REPORT  Name/Age/Gender Kelsey Bridges 73 y.o. female  Code Status    Code Status Orders  (From admission, onward)           Start     Ordered   09/23/24 0342  Full code  Continuous       Question:  By:  Answer:  Other   09/23/24 0352           Code Status History     Date Active Date Inactive Code Status Order ID Comments User Context   07/17/2021 1849 07/22/2021 1746 Full Code [id number]  Teresa Lonni HERO, MD Inpatient       Home/SNF/Other Home  Chief Complaint Acute ischemic stroke [facility]) [I63.9]  Level of Care/Admitting Diagnosis ED Disposition     ED Disposition  Admit   Condition  --   Comment  Hospital Area: [facility] [[id number]]  Level of Care: ICU [6]  May admit patient to [facility] or [facility] if equivalent level of care is available:: Yes  Diagnosis: Acute ischemic stroke [facility]) [[id number]]  Admitting Physician: OLENA LAMAR PARAS [[id number]]  Attending Physician: OLENA LAMAR PARAS [[id number]]  Certification:: I certify this patient will need inpatient services for at least 2 midnights  Expected Medical Readiness: 09/27/2024          Medical History Past Medical History:  Diagnosis Date   Abnormal Pap smear    Allergy    Atrial fibrillation (HCC)    Dysrhythmia    Family history of breast cancer    Hammer toe    History of colon cancer    Hypertension    Internal hemorrhoids    Osteoarthritis     Allergies Allergies[1]  IV Location/Drains/Wounds Patient Lines/Drains/Airways Status     Active Line/Drains/Airways     Name Placement date Placement time Site Days   Peripheral IV 09/22/24 20 G Right Antecubital 09/22/24  2347  Antecubital  1            Labs/Imaging Results for orders placed or performed during the hospital encounter of 09/22/24 (from the past 48 hours)  CBC with Differential     Status: None   Collection Time: 09/22/24  7:22 PM  Result Value Ref Range    WBC 5.4 4.0 - 10.5 K/uL   RBC 4.16 3.87 - 5.11 MIL/uL   Hemoglobin 12.4 12.0 - 15.0 g/dL   HCT 61.2 63.9 - 53.9 %   MCV 93.0 80.0 - 100.0 fL   MCH 29.8 26.0 - 34.0 pg   MCHC 32.0 30.0 - 36.0 g/dL   RDW 85.5 88.4 - 84.4 %   Platelets 194 150 - 400 K/uL   nRBC 0.0 0.0 - 0.2 %   Neutrophils Relative % 60 %   Neutro Abs 3.3 1.7 - 7.7 K/uL   Lymphocytes Relative 31 %   Lymphs Abs 1.7 0.7 - 4.0 K/uL   Monocytes Relative 7 %   Monocytes Absolute 0.4 0.1 - 1.0 K/uL   Eosinophils Relative 1 %   Eosinophils Absolute 0.1 0.0 - 0.5 K/uL   Basophils Relative 1 %   Basophils Absolute 0.0 0.0 - 0.1 K/uL   Immature Granulocytes 0 %   Abs Immature Granulocytes 0.01 0.00 - 0.07 K/uL    Comment: Performed at [facility], [address], [place], [place] [address]  Comprehensive metabolic panel     Status: Abnormal   Collection Time: 09/22/24  7:22 PM  Result Value  Ref Range   Sodium 140 135 - 145 mmol/L   Potassium 4.0 3.5 - 5.1 mmol/L   Chloride 107 98 - 111 mmol/L   CO2 23 22 - 32 mmol/L   Glucose, Bld 103 (H) 70 - 99 mg/dL    Comment: Glucose reference range applies only to samples taken after fasting for at least 8 hours.   BUN 21 8 - 23 mg/dL   Creatinine, Ser 8.76 (H) 0.44 - 1.00 mg/dL   Calcium  9.3 8.9 - 10.3 mg/dL   Total Protein 6.8 6.5 - 8.1 g/dL   Albumin 4.0 3.5 - 5.0 g/dL   AST 17 15 - 41 U/L   ALT 9 0 - 44 U/L   Alkaline Phosphatase 73 38 - 126 U/L   Total Bilirubin 0.5 0.0 - 1.2 mg/dL   GFR, Estimated 46 (L) >60 mL/min    Comment: (NOTE) Calculated using the CKD-EPI Creatinine Equation (2021)    Anion gap 10 5 - 15    Comment: Performed at [facility], [address] [place], [place] [address]  CBG monitoring, ED     Status: None   Collection Time: 09/23/24  4:23 AM  Result Value Ref Range   Glucose-Capillary 93 70 - 99 mg/dL    Comment: Glucose reference range applies only to samples taken after fasting for at least 8 hours.   MRSA Next Gen by PCR, Nasal     Status: None   Collection Time: 09/23/24  4:28 AM   Specimen: Nasal Mucosa; Nasal Swab  Result Value Ref Range   MRSA by PCR Next Gen NOT DETECTED NOT DETECTED    Comment: (NOTE) The GeneXpert MRSA Assay (FDA approved for NASAL specimens only), is one component of a comprehensive MRSA colonization surveillance program. It is not intended to diagnose MRSA infection nor to guide or monitor treatment for MRSA infections. Test performance is not FDA approved in patients less than 39 years old. Performed at [facility], [address], [place], [place] [address]   CBC     Status: None   Collection Time: 09/23/24  4:29 AM  Result Value Ref Range   WBC 4.9 4.0 - 10.5 K/uL   RBC 4.52 3.87 - 5.11 MIL/uL   Hemoglobin 13.4 12.0 - 15.0 g/dL   HCT 58.4 63.9 - 53.9 %   MCV 91.8 80.0 - 100.0 fL   MCH 29.6 26.0 - 34.0 pg   MCHC 32.3 30.0 - 36.0 g/dL   RDW 85.4 88.4 - 84.4 %   Platelets 184 150 - 400 K/uL   nRBC 0.0 0.0 - 0.2 %    Comment: Performed at [facility], [address], [place], [place] [address]  Creatinine, serum     Status: Abnormal   Collection Time: 09/23/24  4:29 AM  Result Value Ref Range   Creatinine, Ser 1.02 (H) 0.44 - 1.00 mg/dL   GFR, Estimated 57 (L) >60 mL/min    Comment: (NOTE) Calculated using the CKD-EPI Creatinine Equation (2021) Performed at [facility], [address] [place], [place] [address]    CT ANGIO HEAD NECK W WO CM Result Date: 09/23/2024 EXAM: CTA HEAD AND NECK WITHOUT AND WITH 09/23/2024 12:31:29 AM TECHNIQUE: CTA of the head and neck was performed without and with the administration of 75 mL of iohexol  (OMNIPAQUE ) 350 MG/ML injection. Multiplanar 2D and/or 3D reformatted images are provided for review. Automated exposure control, iterative reconstruction, and/or weight based adjustment of  the mA/kV was utilized to reduce the radiation dose to as low as  reasonably achievable. Stenosis of the internal carotid arteries measured using NASCET criteria. COMPARISON: None available CLINICAL HISTORY: Syncope/presyncope, cerebrovascular cause suspected. FINDINGS: CTA NECK: AORTIC ARCH AND ARCH VESSELS: No dissection or arterial injury. No significant stenosis of the brachiocephalic or subclavian arteries. CERVICAL CAROTID ARTERIES: No dissection, arterial injury, or hemodynamically significant stenosis by NASCET criteria. CERVICAL VERTEBRAL ARTERIES: No dissection, arterial injury, or significant stenosis. LUNGS AND MEDIASTINUM: Pulmonary emphysema. SOFT TISSUES: No acute abnormality. BONES: No acute abnormality. CTA HEAD: ANTERIOR CIRCULATION: No significant stenosis of the internal carotid arteries. No significant stenosis of the anterior cerebral arteries. No significant stenosis of the middle cerebral arteries. No aneurysm. POSTERIOR CIRCULATION: Moderate stenosis of the right PCA distal P1 segment. No significant stenosis of the left posterior cerebral artery. No significant stenosis of the basilar artery. No significant stenosis of the vertebral arteries. No aneurysm. OTHER: No dural venous sinus thrombosis on this non-dedicated study. IMPRESSION: 1. No emergent large vessel occlusion. 2. 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 3. Emphysema. Electronically signed by: Franky Stanford MD 09/23/2024 12:42 AM EST RP Workstation: [id number]   CT Head Wo Contrast Result Date: 09/22/2024 EXAM: CT HEAD WITHOUT 09/22/2024 11:19:00 PM TECHNIQUE: CT of the head was performed without the administration of intravenous contrast. Automated exposure control, iterative reconstruction, and/or weight based adjustment of the mA/kV was utilized to reduce the radiation dose to as low as reasonably achievable. COMPARISON: None available. CLINICAL HISTORY: Headache, neuro deficit FINDINGS: BRAIN AND VENTRICLES: No acute intracranial hemorrhage. No mass effect or midline shift.  No extra-axial fluid collection. No evidence of acute infarct. No hydrocephalus. ORBITS: No acute abnormality. SINUSES AND MASTOIDS: Opacification of a few right mastoid air cells. Paranasal sinuses are well aerated. SOFT TISSUES AND SKULL: No acute skull fracture. No acute soft tissue abnormality. IMPRESSION: 1. No acute intracranial abnormality. Electronically signed by: Norman Gatlin MD 09/22/2024 11:24 PM EST RP Workstation: [id number]    Pending Labs Unresulted Labs (From admission, onward)     Start     Ordered   09/23/24 0500  Basic metabolic panel  Tomorrow morning,   R        09/23/24 0352   09/23/24 0500  CBC  Tomorrow morning,   R        09/23/24 0352   09/23/24 0500  Phosphorus  Tomorrow morning,   R        09/23/24 0352   09/23/24 0500  Magnesium  Tomorrow morning,   R        09/23/24 0352   09/23/24 0500  Lipid panel  Tomorrow morning,   R        09/23/24 0352   09/23/24 0344  Hemoglobin A1c  (Glycemic Control (SSI)  Q 4 Hours / Glycemic Control (SSI)  AC +/- HS)  Once,   R       Comments: To assess prior glycemic control    09/23/24 0352            Vitals/Pain Today's Vitals   09/23/24 0645 09/23/24 0650 09/23/24 0700 09/23/24 0743  BP: (!) 137/91 135/81 130/80 (!) 158/84  Pulse:    76  Resp:  14  19  Temp:    98 F (36.7 C)  TempSrc:    Oral  SpO2:  100%  100%  PainSc:        Isolation Precautions No active isolations  Medications Medications  Chlorhexidine   Gluconate Cloth 2 % PADS 6 each (has no administration in time range)  docusate sodium  (COLACE) capsule 100 mg (has no administration in time range)  polyethylene glycol (MIRALAX  / GLYCOLAX ) packet 17 g (has no administration in time range)  insulin  aspart (novoLOG ) injection 0-9 Units ( Subcutaneous Not Given 09/23/24 0439)  nicardipine  (CARDENE ) 20mg  in 0.86% saline 200ml IV infusion (0.1 mg/ml) (3 mg/hr Intravenous Infusion Verify 09/23/24 0750)  heparin  injection 5,000 Units (5,000 Units  Subcutaneous Given 09/23/24 0550)  iohexol  (OMNIPAQUE ) 350 MG/ML injection 75 mL (75 mLs Intravenous Contrast Given 09/23/24 0028)    Mobility walks with person assist    [1]  Allergies Allergen Reactions   Penicillins Rash    Reaction to injection / does not react to tablets

## 2024-09-24 ENCOUNTER — Inpatient Hospital Stay (HOSPITAL_COMMUNITY)

## 2024-09-24 DIAGNOSIS — R29704 NIHSS score 4: Secondary | ICD-10-CM

## 2024-09-24 DIAGNOSIS — I161 Hypertensive emergency: Principal | ICD-10-CM

## 2024-09-24 LAB — LIPID PANEL
Cholesterol: 168 mg/dL (ref 0–200)
HDL: 54 mg/dL (ref >40)
LDL Cholesterol: 102 mg/dL — ABNORMAL HIGH (ref 0–99)
Total CHOL/HDL Ratio: 3.1 ratio
Triglycerides: 58 mg/dL (ref <150)
VLDL: 12 mg/dL (ref 0–40)

## 2024-09-24 LAB — GLUCOSE, CAPILLARY
Glucose-Capillary: 71 mg/dL (ref 70–99)
Glucose-Capillary: 99 mg/dL (ref 70–99)
Glucose-Capillary: 123 mg/dL — ABNORMAL HIGH (ref 70–99)
Glucose-Capillary: 118 mg/dL — ABNORMAL HIGH (ref 70–99)

## 2024-09-24 MED ORDER — LOSARTAN POTASSIUM 50 MG PO TABS
100.0000 mg | ORAL_TABLET | Freq: Every morning | ORAL | Status: DC
  Administered 2024-09-24 – 2024-09-29 (×6): 100 mg via ORAL
  Filled 2024-09-24 (×6): qty 2

## 2024-09-24 MED ORDER — SERTRALINE HCL 100 MG PO TABS
100.0000 mg | ORAL_TABLET | Freq: Every day | ORAL | Status: DC | PRN
  Administered 2024-09-24 – 2024-09-25 (×2): 100 mg via ORAL
  Filled 2024-09-24: qty 1
  Filled 2024-09-24: qty 2

## 2024-09-24 MED ORDER — APIXABAN 5 MG PO TABS
5.0000 mg | ORAL_TABLET | Freq: Two times a day (BID) | ORAL | Status: DC
  Administered 2024-09-24 – 2024-09-29 (×10): 5 mg via ORAL
  Filled 2024-09-24 (×10): qty 1

## 2024-09-24 NOTE — Progress Notes (Addendum)
 STROKE TEAM PROGRESS NOTE   INTERIM HISTORY/SUBJECTIVE Patient presented with hypertensive urgency and was started on IV Cardene  drip at outside hospital and transferred here to ICU for monitoring and treatment  He is sitting up in the chair eating breakfast. She is taking her losartan  and eliquis . Off nicardipine , transfer out of ICU today.  Weakness on the left. Diminished sensation on the left.   OBJECTIVE  CBC    Component Value Date/Time   WBC 6.6 09/23/2024 0843   RBC 4.79 09/23/2024 0843   HGB 14.5 09/23/2024 0843   HGB 12.4 01/15/2022 1015   HCT 43.9 09/23/2024 0843   PLT 191 09/23/2024 0843   PLT 219 01/15/2022 1015   MCV 91.6 09/23/2024 0843   MCH 30.3 09/23/2024 0843   MCHC 33.0 09/23/2024 0843   RDW 14.4 09/23/2024 0843   LYMPHSABS 1.7 09/22/2024 1922   MONOABS 0.4 09/22/2024 1922   EOSABS 0.1 09/22/2024 1922   BASOSABS 0.0 09/22/2024 1922    BMET    Component Value Date/Time   NA 139 09/23/2024 0843   NA 139 06/28/2020 1021   K 3.9 09/23/2024 0843   CL 105 09/23/2024 0843   CO2 22 09/23/2024 0843   GLUCOSE 103 (H) 09/23/2024 0843   BUN 13 09/23/2024 0843   BUN 12 06/28/2020 1021   CREATININE 0.99 09/23/2024 0843   CREATININE 0.96 07/22/2022 1055   CALCIUM  9.5 09/23/2024 0843   GFRNONAA 59 (L) 09/23/2024 0843   GFRNONAA >60 07/22/2022 1055    IMAGING past 24 hours MR BRAIN WO CONTRAST Result Date: 09/23/2024 EXAM: MRI BRAIN WITHOUT CONTRAST 09/23/2024 11:57:00 AM TECHNIQUE: Multiplanar multisequence MRI of the head/brain was performed without the administration of intravenous contrast. COMPARISON: CT head 09/22/2024 and CTA head and neck 09/23/2024. CLINICAL HISTORY: Neurological deficit, acute, stroke suspected. Onset of left lower extremity weakness and dysarthria. FINDINGS: BRAIN AND VENTRICLES: There is a 1.3 cm acute infarct involving the posterior right corona radiata and basal ganglia. No intracranial hemorrhage, mass, midline shift, hydrocephalus,  or extra-axial fluid collection is identified. There is mild cerebral atrophy. Small T2 hyperintensities in the cerebral white matter bilaterally are nonspecific but compatible with minimal chronic small vessel ischemic disease, not atypical for age. 2 adjacent tiny chronic infarcts are suspected inferiorly in the right cerebellar hemisphere. Major intracranial vascular flow voids are preserved. ORBITS: No acute abnormality. SINUSES AND MASTOIDS: Small right and trace left mastoid fluid. Minimal mucosal thickening in the paranasal sinuses. BONES AND SOFT TISSUES: Normal marrow signal. No acute soft tissue abnormality. IMPRESSION: 1. Acute infarct involving the posterior right corona radiata and basal ganglia. 2. 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 Electronically signed by: Dasie Hamburg MD 09/23/2024 12:41 PM EST RP Workstation: [id number]    Vitals:   09/24/24 0400 09/24/24 0500 09/24/24 0600 09/24/24 0700  BP: (!) 151/80 (!) 137/95 (!) 149/85 (!) 141/86  Pulse: (!) 59 61 63 60  Resp: 19 17 19 17   Temp: 97.7 F (36.5 C)     TempSrc: Oral     SpO2: 96% 100% 97% 97%  Weight:      Height:         PHYSICAL EXAM General:  Alert, well-nourished, well-developed patient in no acute distress Psych:  Mood and affect appropriate for situation CV: Regular rate and rhythm on monitor Respiratory:  Regular, unlabored respirations on room air GI: Abdomen soft and nontender   NEURO:  Mental Status: AA&Ox3, patient is able to give clear and coherent history Speech/Language: speech  is without dysarthria or aphasia.  Naming, repetition, fluency, and comprehension intact.  Cranial Nerves:  II: PERRL. Visual fields full.  III, IV, VI: EOMI. Eyelids elevate symmetrically.  V: Sensation is intact to light touch and symmetrical to face.  VII: Face is symmetrical resting and smiling VIII: hearing intact to voice. IX, X: Palate elevates symmetrically. Phonation is normal.  KP:Dynloizm shrug  5/5. XII: tongue is midline without fasciculations. Motor: Left upper and lower extremity with mild weakness Right upper and lower extremity with full strength Tone: is normal and bulk is normal Sensation-diminished sensation on the left leg. Equal sensation  Coordination: FTN intact bilaterally, HKS: no ataxia in BLE.No drift.  Gait- deferred  Most Recent NIH 3     ASSESSMENT/PLAN  Ms. Kelsey Bridges is a 73 y.o. female with history of essential hypertension, atrial fibrillation on Eliquis , nicotine dependence with current use, colorectal cancer stage IIIb s/p left colectomy 2022 and chemo who presented to [facility] 12/12 for evaluation of left leg weakness with difficulty ambulating and slurred speech.  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  CTA head & neck Moderate stenosis of the right PCA distal P1 segment.  MRI  Acute infarct involving the posterior right corona radiata and basal ganglia. 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 2D Echo Pending  LDL 102 HgbA1c 5.3 VTE prophylaxis - Eliquis   Eliquis  (apixaban ) daily prior to admission, now on Eliquis  (apixaban ) daily  Therapy recommendations:  Pending Disposition:  Pending   Atrial fibrillation Home Meds: Eliquis   Continue telemetry monitoring Patient states she was not taking her diltiazem   Hypertension Home meds: Losartan  Stable Blood Pressure Goal: BP less than 220/110  Cardene  is off, home meds resumed  Hyperlipidemia LDL 102, goal < 70 Add atorvastatin  40 Continue statin at discharge  Dysphagia Patient has post-stroke dysphagia, SLP consulted    Diet   Diet Heart Room service appropriate? Yes; Fluid consistency: Thin   Advance diet as tolerated  Other Stroke Risk Factors ETOH use, alcohol level No  results found for requested labs within last 1095 days., advised to drink no more than 1 drink(s) a day Obesity, Body mass index is 32.12 kg/m., BMI >/= 30 associated with increased stroke risk, recommend weight loss, diet and exercise as appropriate    Hospital day # 1  Patient seen and examined by NP/APP with MD. MD to update note as needed.   Jorene Last, DNP, FNP-BC [facility] Pager: [phone]  I have personally obtained history,examined this patient, reviewed notes, independently viewed imaging studies, participated in medical decision making and plan of care.ROS completed by me personally and pertinent positives fully documented  I have made any additions or clarifications directly to the above note. Agree with note above.  Patient with A-fib with questionable compliance with Eliquis  presents with left-sided weakness secondary to right subcortical infarct.  Patient counseled to be compliant with her medication.  Resume oral blood pressure medications and use as needed IV labetalol  and hydralazine  and wean off Cleviprex drip as tolerated.  Keep systolic below 220.  Mobilize out of bed.  Therapy consult.  Discussed with Dr. Jude critical care medicine. This patient is critically ill and at significant risk of neurological worsening, death and care requires constant monitoring of vital signs, hemodynamics,respiratory and cardiac monitoring, extensive review of  multiple databases, frequent neurological assessment, discussion with family, other specialists and medical decision making of high complexity.I have made any additions or clarifications directly to the above note.This critical care time does not reflect procedure time, or teaching time or supervisory time of PA/NP/Med Resident etc but could involve care discussion time.  I spent 30 minutes of neurocritical care time  in the care of  this patient.      Eather Popp, MD Medical Director [facility] Stroke  [facility] Pager: [phone] 09/24/2024 1:12 PM  To contact Stroke Continuity provider, please refer to [email or web address]. After hours, contact General Neurology

## 2024-09-24 NOTE — Discharge Instructions (Signed)
 Dear Erminio JULIANNA Slade,   Congratulations for your interest in quitting smoking!  Find a program that suits you best: when you want to quit, how you need support, where you live, and how you like to learn.    If youre ready to get started TODAY, consider scheduling a visit through [email or web address].  Appointments are available from 8am to 8pm, Monday to Friday.   Most health insurance plans will cover some level of tobacco cessation visits and medications.    Additional Resources: [employer] are also available to help you quit & provide the support youll need. Many programs are available in both English and Spanish and have a long history of successfully helping people get off and stay off tobacco.    Quit Smoking Apps:  quitSTART at seriousbroker.de QuitGuide?at forgetparking.dk Online education and resources: Smokefree  at borders group.gov Free Telephone Coaching: QuitNow,  Call 1-800-QUIT-NOW ((715)174-9367) or Text- Ready to 502-556-3524 *Quitline [place] has teamed up with Medicaid to offer a free 14 week program    Vaping- Want to Quit? Free 24/7 support. Call [facility]  [place], [place], [place], [place], [place]  [place]  Information on my medicine - ELIQUIS  (apixaban )  This medication education was reviewed with me or my healthcare representative as part of my discharge preparation.    Why was Eliquis  prescribed for you? Eliquis  was prescribed for you to reduce the risk of a blood clot forming that can cause a stroke if you have a medical condition called atrial fibrillation (a type of irregular heartbeat).  What do You need to know about Eliquis  ? Take your Eliquis  TWICE DAILY - one tablet in the morning and one tablet in the evening with or without food. If you have difficulty swallowing the tablet whole please discuss with your pharmacist how to take the medication safely.  Take  Eliquis  exactly as prescribed by your doctor and DO NOT stop taking Eliquis  without talking to the doctor who prescribed the medication.  Stopping may increase your risk of developing a stroke.  Refill your prescription before you run out.  After discharge, you should have regular check-up appointments with your healthcare provider that is prescribing your Eliquis .  In the future your dose may need to be changed if your kidney function or weight changes by a significant amount or as you get older.  What do you do if you miss a dose? If you miss a dose, take it as soon as you remember on the same day and resume taking twice daily.  Do not take more than one dose of ELIQUIS  at the same time to make up a missed dose.  Important Safety Information A possible side effect of Eliquis  is bleeding. You should call your healthcare provider right away if you experience any of the following: Bleeding from an injury or your nose that does not stop. Unusual colored urine (red or dark brown) or unusual colored stools (red or black). Unusual bruising for unknown reasons. A serious fall or if you hit your head (even if there is no bleeding).  Some medicines may interact with Eliquis  and might increase your risk of bleeding or clotting while on Eliquis . To help avoid this, consult your healthcare provider or pharmacist prior to using any new prescription or non-prescription medications, including herbals, vitamins, non-steroidal anti-inflammatory drugs (NSAIDs) and supplements.  This website has more information on Eliquis  (apixaban ): http://[email or web address] .com/eliquis dena

## 2024-09-24 NOTE — Plan of Care (Signed)
 Received from [facility] at [place] on wheel-chair accompanied by RN Victoria. Head to toe assessment done. NIH done and vitals were monitored. Oriented to new environment.  Problem: Education: Goal: Knowledge of secondary prevention will improve (MUST DOCUMENT ALL) Outcome: Progressing   Problem: Education: Goal: Knowledge of patient specific risk factors will improve (DELETE if not current risk factor) Outcome: Progressing   Problem: Coping: Goal: Will verbalize positive feelings about self Outcome: Progressing   Problem: Ischemic Stroke/TIA Tissue Perfusion: Goal: Complications of ischemic stroke/TIA will be minimized Outcome: Progressing   Problem: Self-Care: Goal: Ability to communicate needs accurately will improve Outcome: Progressing   Problem: Clinical Measurements: Goal: Will remain free from infection Outcome: Progressing

## 2024-09-24 NOTE — Progress Notes (Addendum)
 NAME:  Kelsey INSCO, MRN:  [id number], DOB:  01-12-1951, LOS: 1 ADMISSION DATE:  09/22/2024, CONSULTATION DATE:  09/24/2024 REFERRING MD:  ED, CHIEF COMPLAINT:  L leg weakness   History of Present Illness:  Kelsey Bridges is a 73 y/o F with a PMH significant for HTN, atrial fibrillation on Eliquis , nicotine dependence with current use, colorectal cancer stage III b s/p left collectomy 07/17/2021 and chemoRx who presents for acute onset left lower extremity weakness and dysarthria. Last known normal 3 PM on 09/22/2024.  Around 3 PM yesterday, she experienced an inability to stand due to weakness in the left leg whic is new for her. She does have a hx of numbness in the left foot and toes, which has persisted for a couple of months. She also experienced slurred speech, describing her tongue as feeling heavy and obstructive. No previous episodes of similar weakness or slurred speech were reported. She also noted that after drinking, she noticed water coming down her left lip.  She has a history of atrial fibrillation, managed with Eliquis  taken twice daily, and hypertension managed with losartan  100 mg daily. Upon arrival at the emergency department, she was told her blood pressure was very high, which scared her. She also has a history of bowel cancer, treated with surgery and chemotherapy approximately one to two years ago, with no current issues related to bowel cancer.  In terms of social history, she is a current smoker. She denies any family history of stroke but mentions a father-in-law with a heart condition. During the review of symptoms, she reported a headache at the onset of her symptoms and visual disturbances described as 'shadows of purple' when looking at the TV. No other significant medical problems aside from those mentioned.  Pertinent  Medical History   Past Medical History:  Diagnosis Date   Abnormal Pap smear    Allergy    Atrial fibrillation (HCC)    Dysrhythmia     Family history of breast cancer    Hammer toe    History of colon cancer    Hypertension    Internal hemorrhoids    Osteoarthritis     Significant Hospital Events: Including procedures, antibiotic start and stop dates in addition to other pertinent events   12/12 --> ED for stroke like symtoms  Interim History / Subjective:   Denies headache or chest pain Out of bed to chair, on room air  Objective    Blood pressure 122/83, pulse 64, temperature 98.6 F (37 C), temperature source Axillary, resp. rate 13, height 5' 1 (1.549 m), weight 77.1 kg, SpO2 99%.        Intake/Output Summary (Last 24 hours) at 09/24/2024 1228 Last data filed at 09/24/2024 9265 Gross per 24 hour  Intake 841.32 ml  Output 825 ml  Net 16.32 ml   Filed Weights   09/23/24 0853  Weight: 77.1 kg    Examination:  Would not let me examine fully General: well appearing, no distress No pallor, icterus Clear lungs Weak left arm and leg 4/5, right good strength Alert, interactive  Labs 12/13 show normal electrolytes, no leukocytosis   Resolved problem list   Assessment and Plan   #Acute Ischemic Stroke: > Patient presented with left leg weakness as well as slurred speech. NIH SS is 4. CT head ruled out ICH. CTA head and neck with moderate stenosis of R PCA distal P1 segment. MRI Brain shows acute infarct in right corona radiator and basal ganglia.  The  patient did not get lytics due to being on Eliquis  for atrial fibrillation.  - Await echo, LDL 102 - Stroke service following -  PT/OT  #Hypertensive Emergency: - Gently required Cardene  drip - Permissive hypertension with SBP goal < 220 mmHg, given Eliquis  will aim for less than 180 Resumed losartan   He appears noncompliant with Cardizem  so we will hold  #Atrial Fibrillation: - Continue Eliquis   Resume home Zoloft   Can transfer to telemetry and to [facility] 12/15  Patient Lines/Drains/Airways Status     Active Line/Drains/Airways     Name  Placement date Placement time Site Days   Peripheral IV 09/22/24 20 G Right Antecubital 09/22/24  2347  Antecubital  2   Peripheral IV 09/23/24 22 G 1 Posterior;Right Hand 09/23/24  0852  Hand  1   Peripheral IV 09/23/24 20 G 1 Anterior;Left Forearm 09/23/24  0912  Forearm  1   External Urinary Catheter 09/23/24  1600  --  1            Labs   CBC: Recent Labs  Lab 09/22/24 1922 09/23/24 0429 09/23/24 0843  WBC 5.4 4.9 6.6  NEUTROABS 3.3  --   --   HGB 12.4 13.4 14.5  HCT 38.7 41.5 43.9  MCV 93.0 91.8 91.6  PLT 194 184 191    Basic Metabolic Panel: Recent Labs  Lab 09/22/24 1922 09/23/24 0429 09/23/24 0843  NA 140  --  139  K 4.0  --  3.9  CL 107  --  105  CO2 23  --  22  GLUCOSE 103*  --  103*  BUN 21  --  13  CREATININE 1.23* 1.02* 0.99  CALCIUM  9.3  --  9.5  MG  --   --  2.4  PHOS  --   --  3.1   GFR: Estimated Creatinine Clearance: 46.8 mL/min (by C-G formula based on SCr of 0.99 mg/dL). Recent Labs  Lab 09/22/24 1922 09/23/24 0429 09/23/24 0843  WBC 5.4 4.9 6.6    Liver Function Tests: Recent Labs  Lab 09/22/24 1922 09/23/24 0843  AST 17 28  ALT 9 6  ALKPHOS 73 78  BILITOT 0.5 0.7  PROT 6.8 7.4  ALBUMIN 4.0 4.1   No results for input(s): LIPASE, AMYLASE in the last 168 hours. No results for input(s): AMMONIA in the last 168 hours.  ABG    Component Value Date/Time   TCO2 24 02/07/2014 1318     Coagulation Profile: Recent Labs  Lab 09/23/24 0843  INR 1.0    Cardiac Enzymes: No results for input(s): CKTOTAL, CKMB, CKMBINDEX, TROPONINI in the last 168 hours.  HbA1C: Hgb A1c MFr Bld  Date/Time Value Ref Range Status  09/23/2024 04:29 AM 5.3 4.8 - 5.6 % Final    Comment:    (NOTE) Diagnosis of Diabetes The following HbA1c ranges recommended by the American Diabetes Association (ADA) may be used as an aid in the diagnosis of diabetes mellitus.  Hemoglobin             Suggested A1C NGSP%               Diagnosis  <5.7                   Non Diabetic  5.7-6.4                Pre-Diabetic  >6.4  Diabetic  <7.0                   Glycemic control for                       adults with diabetes.    07/07/2021 03:24 PM 5.6 4.8 - 5.6 % Final    Comment:    (NOTE) Pre diabetes:          5.7%-6.4%  Diabetes:              >6.4%  Glycemic control for   <7.0% adults with diabetes     CBG: Recent Labs  Lab 09/23/24 1941 09/23/24 2316 09/24/24 0307 09/24/24 0756 09/24/24 1140  GLUCAP 101* 173* 71 99 123*    Harden Staff MD. FCCP. [facility] Pager : [phone]  If no response to pager , please call [phone] until 7 pm After 7:00 pm call Elink  [phone]   09/24/2024

## 2024-09-24 NOTE — Evaluation (Signed)
 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 PTA Pt indep without AD, living alone, and driving. Pt presenting with L sided weakness, impaired balance requiring use of RW and external support for safe ambulation, impaired co-ordination, impaired sequencing, delayed processing, and impaired sensation t/o L UE and LE. Pt to strongly benefit from inpatient rehab program > 3 hrs a day to address above deficits and achieve maximal functional recovery. Acute PT to cont to follow.        If plan is discharge home, recommend the following: A lot of help with walking and/or transfers;A little help with bathing/dressing/bathroom;Assistance with cooking/housework;Supervision due to cognitive status;Assist for transportation   Can travel by private vehicle        Equipment Recommendations  (TBD at next venue)  Recommendations for Other Services       Functional Status Assessment Patient has had a recent decline in their functional status and demonstrates the ability to make significant improvements in function in a reasonable and predictable amount of time.     Precautions / Restrictions Precautions Precautions: Fall Precaution/Restrictions Comments: L sided weakness Restrictions Weight Bearing Restrictions Per Provider Order: No      Mobility  Bed Mobility               General bed mobility comments: pt received sitting up in chair    Transfers Overall transfer level: Needs assistance Equipment used: Rolling walker (2 wheels) Transfers: Sit to/from Stand Sit  to Stand: Mod assist           General transfer comment: max directional verbal cues, blocked L foot, increased time, tactile cues for hand placement. completed 3 sit to stands    Ambulation/Gait Ambulation/Gait assistance: Min assist, Mod assist Gait Distance (Feet): 100 Feet Assistive device: Rolling walker (2 wheels) Gait Pattern/deviations: Step-through pattern, Decreased stride length, Decreased stance time - left Gait velocity: dec Gait velocity interpretation: <1.31 ft/sec, indicative of household ambulator   General Gait Details: pt with episodes of bilat knee bending/near buckling, first L then R knee requiring modA to maintain balance. Pt stating thats just what these legs do minA for walker management  Stairs            Wheelchair Mobility     Tilt Bed    Modified Rankin (Stroke Patients Only)       Balance Overall balance assessment: Needs assistance Sitting-balance support: Feet supported Sitting balance-Leahy Scale: Fair     Standing balance support: Bilateral upper extremity supported, During functional activity, Reliant on assistive device for balance Standing balance-Leahy Scale: Poor Standing balance comment: reliant on RW                             Pertinent Vitals/Pain Pain Assessment Pain Assessment: No/denies pain    Home Living Family/patient expects to be discharged to:: Private residence Living Arrangements: Alone Available Help at Discharge: Family;Available PRN/intermittently Type of Home: Apartment (reports senoir living complex) Home Access: Level entry       Home Layout: One level Home Equipment: [place] -  single point      Prior Function Prior Level of Function : Independent/Modified Independent             Mobility Comments: no AD, started using cane a couple of days ago ADLs Comments: indep, drives     Extremity/Trunk Assessment   Upper Extremity Assessment Upper Extremity Assessment: LUE  deficits/detail LUE Deficits / Details: weaker than R    Lower Extremity Assessment Lower Extremity Assessment: LLE deficits/detail LLE Deficits / Details: grossly 4-/5 LLE Sensation: decreased light touch;decreased proprioception LLE Coordination: decreased gross motor    Cervical / Trunk Assessment Cervical / Trunk Assessment: Normal  Communication   Communication Communication: Impaired Factors Affecting Communication: Difficulty expressing self (dealyed response)    Cognition Arousal: Alert Behavior During Therapy: WFL for tasks assessed/performed   PT - Cognitive impairments: No family/caregiver present to determine baseline                       PT - Cognition Comments: no family present however pt with noted delayed processing, difficulty ordering lunch over phone requiring increased time and assist from PT for clarification Following commands: Impaired Following commands impaired: Follows one step commands with increased time     Cueing Cueing Techniques: Verbal cues, Gestural cues     General Comments General comments (skin integrity, edema, etc.): VSS    Exercises     Assessment/Plan    PT Assessment Patient needs continued PT services  PT Problem List Decreased strength;Decreased activity tolerance;Decreased balance;Decreased mobility       PT Treatment Interventions DME instruction;Gait training;Functional mobility training;Therapeutic activities;Therapeutic exercise;Balance training    PT Goals (Current goals can be found in the Care Plan section)  Acute Rehab PT Goals Patient Stated Goal: home PT Goal Formulation: With patient Time For Goal Achievement: 10/08/24 Potential to Achieve Goals: Good    Frequency Min 4X/week     Co-evaluation               AM-PAC PT 6 Clicks Mobility  Outcome Measure Help needed turning from your back to your side while in a flat bed without using bedrails?: A Little Help needed moving from lying  on your back to sitting on the side of a flat bed without using bedrails?: A Little Help needed moving to and from a bed to a chair (including a wheelchair)?: A Little Help needed standing up from a chair using your arms (e.g., wheelchair or bedside chair)?: A Little Help needed to walk in hospital room?: A Lot Help needed climbing 3-5 steps with a railing? : A Lot 6 Click Score: 16    End of Session Equipment Utilized During Treatment: Gait belt Activity Tolerance: Patient tolerated treatment well Patient left: in chair;with call bell/phone within reach;with chair alarm set Nurse Communication: Mobility status PT Visit Diagnosis: Unsteadiness on feet (R26.81)    Time: 8785-8751 PT Time Calculation (min) (ACUTE ONLY): 34 min   Charges:   PT Evaluation $PT Eval Moderate Complexity: 1 Mod PT Treatments $Gait Training: 8-22 mins PT General Charges $$ ACUTE PT VISIT: 1 Visit         Norene Ames, PT, DPT Acute Rehabilitation Services Secure chat preferred Office #: [phone]   Norene CHRISTELLA Ames 09/24/2024, 2:23 PM

## 2024-09-24 NOTE — Plan of Care (Addendum)
 0830:  Pt ambulated in room and placed in chair.  Pt required 2 person assist.  0915:  Neuro team, Dr. Rosemarie rounded on pt.  See provider note.  1000:  PT ambulated pt with walker.  See PT note.  1730:  Pt transported to 3W32 by this RN.  Pt had all belongings to include red cellphone, charger, ring, and clothing on their person.  Pt did not show signs of distress and was left in the care of 3W RN.   Problem: Education: Goal: Ability to describe self-care measures that may prevent or decrease complications (Diabetes Survival Skills Education) will improve Outcome: Progressing Goal: Individualized Educational Video(s) Outcome: Progressing   Problem: Coping: Goal: Ability to adjust to condition or change in health will improve Outcome: Progressing   Problem: Fluid Volume: Goal: Ability to maintain a balanced intake and output will improve Outcome: Progressing   Problem: Health Behavior/Discharge Planning: Goal: Ability to identify and utilize available resources and services will improve Outcome: Progressing Goal: Ability to manage health-related needs will improve Outcome: Progressing   Problem: Metabolic: Goal: Ability to maintain appropriate glucose levels will improve Outcome: Progressing   Problem: Nutritional: Goal: Maintenance of adequate nutrition will improve Outcome: Progressing Goal: Progress toward achieving an optimal weight will improve Outcome: Progressing   Problem: Skin Integrity: Goal: Risk for impaired skin integrity will decrease Outcome: Progressing   Problem: Tissue Perfusion: Goal: Adequacy of tissue perfusion will improve Outcome: Progressing   Problem: Education: Goal: Knowledge of disease or condition will improve Outcome: Progressing Goal: Knowledge of secondary prevention will improve (MUST DOCUMENT ALL) Outcome: Progressing Goal: Knowledge of patient specific risk factors will improve (DELETE if not current risk factor) Outcome:  Progressing   Problem: Ischemic Stroke/TIA Tissue Perfusion: Goal: Complications of ischemic stroke/TIA will be minimized Outcome: Progressing   Problem: Coping: Goal: Will verbalize positive feelings about self Outcome: Progressing Goal: Will identify appropriate support needs Outcome: Progressing   Problem: Health Behavior/Discharge Planning: Goal: Ability to manage health-related needs will improve Outcome: Progressing Goal: Goals will be collaboratively established with patient/family Outcome: Progressing   Problem: Self-Care: Goal: Ability to participate in self-care as condition permits will improve Outcome: Progressing Goal: Verbalization of feelings and concerns over difficulty with self-care will improve Outcome: Progressing Goal: Ability to communicate needs accurately will improve Outcome: Progressing   Problem: Nutrition: Goal: Risk of aspiration will decrease Outcome: Progressing Goal: Dietary intake will improve Outcome: Progressing   Problem: Education: Goal: Knowledge of General Education information will improve Description: Including pain rating scale, medication(s)/side effects and non-pharmacologic comfort measures Outcome: Progressing   Problem: Health Behavior/Discharge Planning: Goal: Ability to manage health-related needs will improve Outcome: Progressing   Problem: Clinical Measurements: Goal: Ability to maintain clinical measurements within normal limits will improve Outcome: Progressing Goal: Will remain free from infection Outcome: Progressing Goal: Diagnostic test results will improve Outcome: Progressing Goal: Respiratory complications will improve Outcome: Progressing Goal: Cardiovascular complication will be avoided Outcome: Progressing   Problem: Activity: Goal: Risk for activity intolerance will decrease Outcome: Progressing   Problem: Nutrition: Goal: Adequate nutrition will be maintained Outcome: Progressing   Problem:  Coping: Goal: Level of anxiety will decrease Outcome: Progressing   Problem: Elimination: Goal: Will not experience complications related to bowel motility Outcome: Progressing Goal: Will not experience complications related to urinary retention Outcome: Progressing   Problem: Pain Managment: Goal: General experience of comfort will improve and/or be controlled Outcome: Progressing   Problem: Safety: Goal: Ability to remain free from injury will  improve Outcome: Progressing   Problem: Skin Integrity: Goal: Risk for impaired skin integrity will decrease Outcome: Progressing

## 2024-09-24 NOTE — Progress Notes (Signed)
 Inpatient Rehab Admissions Coordinator Note:   Per PT patient was screened for CIR candidacy by Travin Marik SHAUNNA Yvone Cohens, CCC-SLP. At this time, pt appears to be a potential candidate for CIR. If pt would like to be considered, please place an IP Rehab MD  consult order.   Tinnie Yvone Cohens, MS, CCC-SLP Admissions Coordinator [phone] 09/24/2024 4:06 PM

## 2024-09-25 ENCOUNTER — Inpatient Hospital Stay (HOSPITAL_COMMUNITY)

## 2024-09-25 DIAGNOSIS — I6389 Other cerebral infarction: Secondary | ICD-10-CM

## 2024-09-25 DIAGNOSIS — I639 Cerebral infarction, unspecified: Secondary | ICD-10-CM | POA: Diagnosis not present

## 2024-09-25 LAB — ECHOCARDIOGRAM COMPLETE
Area-P 1/2: 7.16 cm2
Calc EF: 71.7 %
Height: 61 in
S' Lateral: 1.9 cm
Single Plane A2C EF: 75.1 %
Single Plane A4C EF: 67.7 %
Weight: 2243.4 [oz_av]

## 2024-09-25 MED ORDER — ONDANSETRON HCL 4 MG/2ML IJ SOLN
4.0000 mg | Freq: Once | INTRAMUSCULAR | Status: AC
  Administered 2024-09-25: 06:00:00 4 mg via INTRAVENOUS
  Filled 2024-09-25: qty 2

## 2024-09-25 NOTE — Progress Notes (Addendum)
 STROKE TEAM PROGRESS NOTE   INTERIM HISTORY/SUBJECTIVE Patient is seen in her room with no family at the bedside.   No complaints.  Neurologic exam is stable.  Vital signs stable. Echo showed normal EF.  Patient is in A-fib during the echo OBJECTIVE  CBC    Component Value Date/Time   WBC 6.6 09/23/2024 0843   RBC 4.79 09/23/2024 0843   HGB 14.5 09/23/2024 0843   HGB 12.4 01/15/2022 1015   HCT 43.9 09/23/2024 0843   PLT 191 09/23/2024 0843   PLT 219 01/15/2022 1015   MCV 91.6 09/23/2024 0843   MCH 30.3 09/23/2024 0843   MCHC 33.0 09/23/2024 0843   RDW 14.4 09/23/2024 0843   LYMPHSABS 1.7 09/22/2024 1922   MONOABS 0.4 09/22/2024 1922   EOSABS 0.1 09/22/2024 1922   BASOSABS 0.0 09/22/2024 1922    BMET    Component Value Date/Time   NA 139 09/23/2024 0843   NA 139 06/28/2020 1021   K 3.9 09/23/2024 0843   CL 105 09/23/2024 0843   CO2 22 09/23/2024 0843   GLUCOSE 103 (H) 09/23/2024 0843   BUN 13 09/23/2024 0843   BUN 12 06/28/2020 1021   CREATININE 0.99 09/23/2024 0843   CREATININE 0.96 07/22/2022 1055   CALCIUM  9.5 09/23/2024 0843   GFRNONAA 59 (L) 09/23/2024 0843   GFRNONAA >60 07/22/2022 1055    IMAGING past 24 hours No results found.   Vitals:   09/25/24 0341 09/25/24 0500 09/25/24 0607 09/25/24 0746  BP: (!) 180/100  (!) 152/93 117/79  Pulse:    88  Resp:    15  Temp:    98.4 F (36.9 C)  TempSrc:    Oral  SpO2:    100%  Weight:  63.6 kg    Height:         PHYSICAL EXAM General:  Alert, well-nourished, well-developed patient in no acute distress Psych:  Mood and affect appropriate for situation CV: Regular rate and rhythm on monitor Respiratory:  Regular, unlabored respirations on room air   NEURO:  Mental Status: AA&Ox3, patient is able to give clear and coherent history Speech/Language: speech is without dysarthria or aphasia.  Fluency, and comprehension intact.  Cranial Nerves:  II: PERRL. Visual fields full.  III, IV, VI: EOMI. Eyelids  elevate symmetrically.  V: Sensation is intact to light touch and symmetrical to face.  VII: Face is symmetrical resting and smiling VIII: hearing intact to voice. IX, X: Phonation is normal.  XII: tongue is midline without fasciculations. Motor: Left upper and lower extremity with mild weakness, diminished fine motor movements in the left hand Right upper and lower extremity with full strength Tone: is normal and bulk is normal Sensation-intact to light touch throughout Coordination: FTN intact bilaterally Gait- deferred  Most Recent NIH 3     ASSESSMENT/PLAN  Ms. Kelsey Bridges is a 73 y.o. female with history of essential hypertension, atrial fibrillation on Eliquis , nicotine dependence with current use, colorectal cancer stage IIIb s/p left colectomy 2022 and chemo who presented to [facility] 12/12 for evaluation of left leg weakness with difficulty ambulating and slurred speech.  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  CTA head & neck Moderate stenosis of the right PCA distal P1 segment.  MRI  Acute infarct involving the posterior right  corona radiata and basal ganglia. 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 2D Echo EF 65-70%, normal left atrial size, no atrial level shunt but in a-flutter during exam  LDL 102 HgbA1c 5.3 VTE prophylaxis - Eliquis   Eliquis  (apixaban ) daily prior to admission, now on Eliquis  (apixaban ) daily  Therapy recommendations:  CIR Disposition:  Pending   Atrial fibrillation Home Meds: Eliquis   Continue telemetry monitoring Patient states she was not taking her diltiazem   Hypertension Home meds: Losartan  Stable Blood Pressure Goal: BP less than 220/110  Cardene  is off, home meds resumed  Hyperlipidemia LDL 102, goal < 70 Add atorvastatin   40 Continue statin at discharge  Dysphagia Patient has post-stroke dysphagia, SLP consulted    Diet   Diet Heart Room service appropriate? Yes; Fluid consistency: Thin   Advance diet as tolerated  Other Stroke Risk Factors ETOH use, alcohol level No results found for requested labs within last 1095 days., advised to drink no more than 1 drink(s) a day Obesity, Body mass index is 26.49 kg/m., BMI >/= 30 associated with increased stroke risk, recommend weight loss, diet and exercise as appropriate    Hospital day # 2  Patient seen and examined by NP/APP with MD. MD to update note as needed.   Cortney E Everitt Clint Kill , MSN, AGACNP-BC [facility] See Amion for schedule and pager information  09/25/2024 10:13 AM   Patient neurologically stable.  Continue Eliquis  for stroke prevention.  Continue ongoing therapies.  Stroke team will sign off.  Kindly call for questions. Eather Popp, MD To contact Stroke Continuity provider, please refer to [email or web address]. After hours, contact General Neurology

## 2024-09-25 NOTE — Progress Notes (Signed)
°  Echocardiogram 2D Echocardiogram has been performed.  [place] R 09/25/2024, 11:00 AM

## 2024-09-25 NOTE — Progress Notes (Signed)
 Inpatient Rehab Admissions:  Inpatient Rehab Consult received.  I met with patient at the bedside for rehabilitation assessment and to discuss goals and expectations of an inpatient rehab admission.  Discussed average length of stay, insurance authorization requirement and discharge home after completion of CIR. Pt acknowledged understanding. Pt hesitant to allow [facility] to contact family regarding dispo. Eventually gave [facility] permission to contact daughter Suzen. Left a message for Suzen; awaiting return call. Will continue to follow.  Signed: Tinnie Yvone Cohens, MS, CCC-SLP Admissions Coordinator [phone]

## 2024-09-25 NOTE — Progress Notes (Signed)
 NAME:  Kelsey Bridges, MRN:  [id number], DOB:  Aug 19, 1951, LOS: 2 ADMISSION DATE:  09/22/2024, CONSULTATION DATE:  09/25/2024 REFERRING MD:  ED, CHIEF COMPLAINT:  L leg weakness   History of Present Illness:  Kelsey Bridges is a 73 y/o F with a PMH significant for HTN, atrial fibrillation on Eliquis , nicotine dependence with current use, colorectal cancer stage III b s/p left collectomy 07/17/2021 and chemoRx who presents for acute onset left lower extremity weakness and dysarthria. Last known normal 3 PM on 09/22/2024.  Around 3 PM yesterday, she experienced an inability to stand due to weakness in the left leg whic is new for her. She does have a hx of numbness in the left foot and toes, which has persisted for a couple of months. She also experienced slurred speech, describing her tongue as feeling heavy and obstructive. No previous episodes of similar weakness or slurred speech were reported. She also noted that after drinking, she noticed water coming down her left lip.  She has a history of atrial fibrillation, managed with Eliquis  taken twice daily, and hypertension managed with losartan  100 mg daily. Upon arrival at the emergency department, she was told her blood pressure was very high, which scared her. She also has a history of bowel cancer, treated with surgery and chemotherapy approximately one to two years ago, with no current issues related to bowel cancer.  In terms of social history, she is a current smoker. She denies any family history of stroke but mentions a father-in-law with a heart condition. During the review of symptoms, she reported a headache at the onset of her symptoms and visual disturbances described as 'shadows of purple' when looking at the TV. No other significant medical problems aside from those mentioned.  Pertinent  Medical History   Past Medical History:  Diagnosis Date   Abnormal Pap smear    Allergy    Atrial fibrillation (HCC)    Dysrhythmia     Family history of breast cancer    Hammer toe    History of colon cancer    Hypertension    Internal hemorrhoids    Osteoarthritis     Significant Hospital Events: Including procedures, antibiotic start and stop dates in addition to other pertinent events   12/12 --> ED for stroke like symtoms  Interim History / Subjective:   Patient is transferred out of ICU.  Apparently not signed out to TR[facility] therefore ICU was asked to see this patient on floors.  Patient is doing well.  Her blood pressure is good.  Echo is still pending.  Moving all 4 extremities.  Able to eat.  She is speaking but speech is little slow.  Objective    Blood pressure 117/79, pulse 88, temperature 98.4 F (36.9 C), temperature source Oral, resp. rate 15, height 5' 1 (1.549 m), weight 63.6 kg, SpO2 100%.        Intake/Output Summary (Last 24 hours) at 09/25/2024 0908 Last data filed at 09/24/2024 2000 Gross per 24 hour  Intake 240 ml  Output 350 ml  Net -110 ml   Filed Weights   09/23/24 0853 09/25/24 0500  Weight: 77.1 kg 63.6 kg    Examination:  Physical exam: General: Patient is alert and oriented.  She is more cooperative.  No acute distress. HEENT: [place]/AT, eyes anicteric.  moist mucus membranes Neuro: Alert, awake following commands power is 4 x 5 in all extremities.  Decree sensations in bilateral lower extremities.  Plantars going down. Chest:  Coarse breath sounds, no wheezes or rhonchi Heart: Regular rate and rhythm, no murmurs or gallops Abdomen: Soft, nontender, nondistended, bowel sounds present    Resolved problem list   Assessment and Plan   #Acute Ischemic Stroke: > Patient presented with left leg weakness as well as slurred speech. NIH SS is 4. CT head ruled out ICH. CTA head and neck with moderate stenosis of R PCA distal P1 segment. MRI Brain shows acute infarct in right corona radiator and basal ganglia.  The patient did not get lytics due to being on Eliquis  for atrial  fibrillation.  - Await echo, LDL 102 - Stroke service following -  PT/OT  #Hypertensive Emergency: Resolved - Permissive hypertension with SBP goal < 220 mmHg, given Eliquis  will aim for less than 180 Resumed losartan   He appears noncompliant with Cardizem  so we will hold  #Atrial Fibrillation: - Continue Eliquis   Resumed home Zoloft   TRH to pick up12/16  Patient Lines/Drains/Airways Status     Active Line/Drains/Airways     Name Placement date Placement time Site Days   Peripheral IV 09/22/24 20 G Right Antecubital 09/22/24  2347  Antecubital  3   Peripheral IV 09/23/24 22 G 1 Posterior;Right Hand 09/23/24  0852  Hand  2   Peripheral IV 09/23/24 20 G 1 Anterior;Left Forearm 09/23/24  0912  Forearm  2            Labs   CBC: Recent Labs  Lab 09/22/24 1922 09/23/24 0429 09/23/24 0843  WBC 5.4 4.9 6.6  NEUTROABS 3.3  --   --   HGB 12.4 13.4 14.5  HCT 38.7 41.5 43.9  MCV 93.0 91.8 91.6  PLT 194 184 191    Basic Metabolic Panel: Recent Labs  Lab 09/22/24 1922 09/23/24 0429 09/23/24 0843  NA 140  --  139  K 4.0  --  3.9  CL 107  --  105  CO2 23  --  22  GLUCOSE 103*  --  103*  BUN 21  --  13  CREATININE 1.23* 1.02* 0.99  CALCIUM  9.3  --  9.5  MG  --   --  2.4  PHOS  --   --  3.1   GFR: Estimated Creatinine Clearance: 42.6 mL/min (by C-G formula based on SCr of 0.99 mg/dL). Recent Labs  Lab 09/22/24 1922 09/23/24 0429 09/23/24 0843  WBC 5.4 4.9 6.6    Liver Function Tests: Recent Labs  Lab 09/22/24 1922 09/23/24 0843  AST 17 28  ALT 9 6  ALKPHOS 73 78  BILITOT 0.5 0.7  PROT 6.8 7.4  ALBUMIN 4.0 4.1   No results for input(s): LIPASE, AMYLASE in the last 168 hours. No results for input(s): AMMONIA in the last 168 hours.  ABG    Component Value Date/Time   TCO2 24 02/07/2014 1318     Coagulation Profile: Recent Labs  Lab 09/23/24 0843  INR 1.0    Cardiac Enzymes: No results for input(s): CKTOTAL, CKMB,  CKMBINDEX, TROPONINI in the last 168 hours.  HbA1C: Hgb A1c MFr Bld  Date/Time Value Ref Range Status  09/23/2024 04:29 AM 5.3 4.8 - 5.6 % Final    Comment:    (NOTE) Diagnosis of Diabetes The following HbA1c ranges recommended by the American Diabetes Association (ADA) may be used as an aid in the diagnosis of diabetes mellitus.  Hemoglobin             Suggested A1C NGSP%  Diagnosis  <5.7                   Non Diabetic  5.7-6.4                Pre-Diabetic  >6.4                   Diabetic  <7.0                   Glycemic control for                       adults with diabetes.    07/07/2021 03:24 PM 5.6 4.8 - 5.6 % Final    Comment:    (NOTE) Pre diabetes:          5.7%-6.4%  Diabetes:              >6.4%  Glycemic control for   <7.0% adults with diabetes     CBG: Recent Labs  Lab 09/23/24 2316 09/24/24 0307 09/24/24 0756 09/24/24 1140 09/24/24 1531  GLUCAP 173* 71 99 123* 118*     Tamela Stakes, MD  Attending Physician, Critical Care Medicine [facility] See Amion for pager If no response to pager, please call [phone] until 7pm After 7pm, Please call E-link [phone]    09/25/2024

## 2024-09-25 NOTE — Evaluation (Signed)
 Occupational Therapy Evaluation Patient Details Name: Kelsey Bridges MRN: [id number] DOB: 05/04/1951 Today's Date: 09/25/2024   History of Present Illness   73 yo F presented to [facility] 12/12 with LLE weakness and slurred speech BP 229/202, MRI Acute infarct R posterior corona radiata and basal ganglia transfer to [facility] 12/13  NIH 3 PMH essential HTN, afib on eliquis , nicotine dependence colorectal CA s/p L colectomy 2022, OA     Clinical Impressions PTA, pt lived alone and was independent in ADL and IADL. Upon eval, pt with LLE>LUE decreased coordination, balance, strength, safety, and cognition. Pt needing up to min A for functional ADL. Noting pt leaning forward onto sink for support at hips during grooming tasks and needing help for rise during transfers and donning new underpants. Pt benefiting from RW for functional mobility throughout session. Due to significant functional decline and pt previously independent, recommending intensive multidisciplinary rehabilitation >3 hours/day to optimize safety and independence in ADL.       If plan is discharge home, recommend the following:   A little help with walking and/or transfers;A little help with bathing/dressing/bathroom;Assistance with cooking/housework;Assist for transportation;Help with stairs or ramp for entrance     Functional Status Assessment   Patient has had a recent decline in their functional status and demonstrates the ability to make significant improvements in function in a reasonable and predictable amount of time.     Equipment Recommendations   Other (comment) (defer)     Recommendations for Other Services   Rehab consult;Speech consult     Precautions/Restrictions   Precautions Precautions: Fall Recall of Precautions/Restrictions: Intact (pt talking herself through previously learned (during PT eval) safety techniques during transfer training (step back until I feel it), however, continues to need  cues for hand placement for rise and step length) Precaution/Restrictions Comments: L sided weakness Restrictions Weight Bearing Restrictions Per Provider Order: No     Mobility Bed Mobility Overal bed mobility: Needs Assistance Bed Mobility: Supine to Sit, Sit to Supine     Supine to sit: Contact guard Sit to supine: Contact guard assist        Transfers Overall transfer level: Needs assistance Equipment used: Rolling walker (2 wheels) Transfers: Sit to/from Stand Sit to Stand: Min assist           General transfer comment: cues for hand placement; min A for rise      Balance Overall balance assessment: Needs assistance Sitting-balance support: Feet supported Sitting balance-Leahy Scale: Fair     Standing balance support: Bilateral upper extremity supported, During functional activity, Reliant on assistive device for balance Standing balance-Leahy Scale: Poor Standing balance comment: reliant on RW                           ADL either performed or assessed with clinical judgement   ADL Overall ADL's : Needs assistance/impaired Eating/Feeding: Modified independent;Sitting;Bed level   Grooming: Contact guard assist;Standing;Wash/dry hands;Oral care Grooming Details (indicate cue type and reason): noted mild undershooting with reaching toward items at sink witgh LUE; leans forward supporting self on sink with hips Upper Body Bathing: Set up;Sitting   Lower Body Bathing: Minimal assistance;Sit to/from stand   Upper Body Dressing : Set up;Sitting   Lower Body Dressing: Minimal assistance;Sit to/from stand   Toilet Transfer: Minimal assistance;Rolling walker (2 wheels);Ambulation           Functional mobility during ADLs: Minimal assistance;Rolling walker (2 wheels)  Vision Baseline Vision/History: 0 No visual deficits Ability to See in Adequate Light: 0 Adequate Patient Visual Report: No change from baseline Vision Assessment?: No  apparent visual deficits Additional Comments: not formally assessed this session     Perception         Praxis         Pertinent Vitals/Pain Pain Assessment Pain Assessment: No/denies pain     Extremity/Trunk Assessment Upper Extremity Assessment Upper Extremity Assessment: LUE deficits/detail;Right hand dominant LUE Deficits / Details: 4/5 as compareed to R 5/5; decr coordination (mild) with finger to nose and dysdiadokinesia testing LUE Sensation: decreased light touch;decreased proprioception LUE Coordination: decreased gross motor;decreased fine motor   Lower Extremity Assessment Lower Extremity Assessment: Defer to PT evaluation   Cervical / Trunk Assessment Cervical / Trunk Assessment: Normal   Communication Communication Communication: Impaired Factors Affecting Communication: Difficulty expressing self (mild difficulty with word finding)   Cognition Arousal: Alert Behavior During Therapy: WFL for tasks assessed/performed Cognition: Cognition impaired     Awareness: Online awareness impaired Memory impairment (select all impairments): Working civil service fast streamer (repeated cues for hand placement with multiple STS from toilet for hygiene and changing underpants/hygenic items) Attention impairment (select first level of impairment): Selective attention Executive functioning impairment (select all impairments): Organization                   Following commands: Impaired Following commands impaired: Follows one step commands with increased time     Cueing  General Comments   Cueing Techniques: Verbal cues;Gestural cues  VSS   Exercises     Shoulder Instructions      Home Living Family/patient expects to be discharged to:: Private residence Living Arrangements: Alone Available Help at Discharge: Family;Available PRN/intermittently Type of Home: Apartment (pt reports senior living complex) Home Access: Level entry     Home Layout: One level     Bathroom  Shower/Tub: Chief Strategy Officer: Standard     Home Equipment: Cane - single point          Prior Functioning/Environment Prior Level of Function : Independent/Modified Independent             Mobility Comments: no AD, started using cane a couple of days ago ADLs Comments: indep, drives    OT Problem List: Decreased strength;Impaired balance (sitting and/or standing);Decreased activity tolerance;Decreased coordination;Decreased cognition;Decreased safety awareness;Decreased knowledge of use of DME or AE;Decreased knowledge of precautions;Impaired UE functional use   OT Treatment/Interventions: Self-care/ADL training;Therapeutic exercise;DME and/or AE instruction;Neuromuscular education;Therapeutic activities;Cognitive remediation/compensation;Patient/family education;Balance training      OT Goals(Current goals can be found in the care plan section)   Acute Rehab OT Goals Patient Stated Goal: get better OT Goal Formulation: With patient Time For Goal Achievement: 10/09/24 Potential to Achieve Goals: Good   OT Frequency:  Min 2X/week    Co-evaluation              AM-PAC OT 6 Clicks Daily Activity     Outcome Measure Help from another person eating meals?: None Help from another person taking care of personal grooming?: A Little Help from another person toileting, which includes using toliet, bedpan, or urinal?: A Little Help from another person bathing (including washing, rinsing, drying)?: A Little Help from another person to put on and taking off regular upper body clothing?: A Little Help from another person to put on and taking off regular lower body clothing?: A Little 6 Click Score: 19   End of Session Equipment Utilized During  Treatment: Gait belt;Rolling walker (2 wheels) Nurse Communication: Mobility status  Activity Tolerance: Patient tolerated treatment well Patient left: in bed;with call bell/phone within reach;with bed alarm  set  OT Visit Diagnosis: Unsteadiness on feet (R26.81);Muscle weakness (generalized) (M62.81);Other symptoms and signs involving cognitive function                Time: 9190-9158 OT Time Calculation (min): 32 min Charges:  OT General Charges $OT Visit: 1 Visit OT Evaluation $OT Eval Moderate Complexity: 1 Mod  Elma JONETTA Lebron FREDERICK, OTR/L [facility] Acute Rehabilitation Office: [phone]   Elma JONETTA Lebron 09/25/2024, 11:12 AM

## 2024-09-25 NOTE — PMR Pre-admission (Shared)
 PMR Admission Coordinator Pre-Admission Assessment  Patient: Kelsey Bridges is an 73 y.o., female MRN: [id number] DOB: [birth year] Height: 5' 1 (154.9 cm) Weight: 63.6 kg  Insurance Information HMO: yes    PPO:      PCP:      IPA:      80/20:      OTHER:  PRIMARY: [employer]      Policy#: [id number]      Subscriber: patient CM Name: ***      Phone#: ***     Fax#: *** Pre-Cert#: ***      Employer: *** Benefits:  Phone #: ***     Name: *** Eff. Date: ***     Deduct: ***      Out of Pocket Max: ***      Life Max: *** CIR: ***      SNF: *** Outpatient: ***     Co-Pay: *** Home Health: ***      Co-Pay: *** DME: ***     Co-Pay: *** Providers: in-network SECONDARY: Medicaid of [place]      Policy#: [id number]     Phone#: [phone]  Financial Counselor:       Phone#:   The Data Collection Information Summary for patients in Inpatient Rehabilitation Facilities with attached Privacy Act Statement-Health Care Records was provided and verbally reviewed with: Patient  Emergency Contact Information Contact Information     Name Relation Home Work Mobile   [place] [phone]  [phone]      Other Contacts     Name Relation Home Work Mobile   Patrick,Jerryka Granddaughter   [phone]   Primus Francis Rom   [phone]   Primus Iha Daughter [phone]        Current Medical History  Patient Admitting Diagnosis: CVA History of Present Illness: Pt is a 73 year old female with medical hx significant for: HTN, A-fib, nicotine dependence, colorectal CA stage IIIb s/p left colectomy (07/17/21) and chemotherapy.  Pt presented to [facility] on 09/22/24 d/t acute onset of left lower extremity weakness and dysarthria. BP was 229/202 upon arrival. CT head negative for hemorrhage. CTA showed moderate stenosis of R PCA distal P1 segment. MRI revealed acute infarct in right posterior corona radiata and basal ganglia. Pt transferred to [facility] on 09/23/24. *** Therapy evaluations completed and CIR recommended d/t pt's deficits in functional mobility.  Complete NIHSS TOTAL: 3  Patient's medical record from [facility] has been reviewed by the rehabilitation admission coordinator and physician.  Past Medical History  Past Medical History:  Diagnosis Date   Abnormal Pap smear    Allergy    Atrial fibrillation (HCC)    Dysrhythmia    Family history of breast cancer    Hammer toe    History of colon cancer    Hypertension    Internal hemorrhoids    Osteoarthritis     Has the patient had major surgery during 100 days prior to admission? No  Family History   family history includes Cancer in her brother and sister. She was adopted.  Current Medications Current Medications[1]  Patients Current Diet:  Diet Order             Diet Heart Room service appropriate? Yes; Fluid consistency: Thin  Diet effective now                   Precautions / Restrictions Precautions Precautions: Fall Precaution/Restrictions Comments: L sided weakness Restrictions Weight Bearing Restrictions Per Provider  Order: No   Has the patient had 2 or more falls or a fall with injury in the past year? Yes  Prior Activity Level Limited Community (1-2x/wk): MD appointments  Prior Functional Level Self Care: Did the patient need help bathing, dressing, using the toilet or eating? Independent  Indoor Mobility: Did the patient need assistance with walking from room to room (with or without device)? Independent  Stairs: Did the patient need assistance with internal or external stairs (with or without device)? Unknown (reports that she avoids)  Functional Cognition: Did the patient need help planning regular tasks such as shopping or remembering to take medications? Independent  Patient Information Are you of Hispanic, Latino/a,or Spanish origin?: A. No, not of Hispanic, Latino/a, or Spanish origin What is your race?: B.  Black or African American Do you need or want an interpreter to communicate with a doctor or health care staff?: 0. No  Patient's Response To:  Health Literacy and Transportation Is the patient able to respond to health literacy and transportation needs?: Yes Health Literacy - How often do you need to have someone help you when you read instructions, pamphlets, or other written material from your doctor or pharmacy?: Never In the past 12 months, has lack of transportation kept you from medical appointments or from getting medications?: Yes In the past 12 months, has lack of transportation kept you from meetings, work, or from getting things needed for daily living?: No  Home Assistive Devices / Equipment Home Equipment: [place] - single point  Prior Device Use: Indicate devices/aids used by the patient prior to current illness, exacerbation or injury? None of the above  Current Functional Level Cognition  Arousal/Alertness: Awake/alert Overall Cognitive Status: Impaired/Different from baseline (functional for acute setting- see impression statement) Orientation Level: Oriented X4 Attention: Sustained Sustained Attention: Appears intact Memory:  (4/4 word recall, recalled strategies using rolling walker, difficulty recalling info for math word problem x 1) Awareness: Appears intact Problem Solving:  (intact except for clock drawing task) Safety/Judgment: Appears intact    Extremity Assessment (includes Sensation/Coordination)  Upper Extremity Assessment: LUE deficits/detail, Right hand dominant LUE Deficits / Details: 4/5 as compareed to R 5/5; decr coordination (mild) with finger to nose and dysdiadokinesia testing LUE Sensation: decreased light touch, decreased proprioception LUE Coordination: decreased gross motor, decreased fine motor  Lower Extremity Assessment: Defer to PT evaluation LLE Deficits / Details: grossly 4-/5 LLE Sensation: decreased light touch, decreased  proprioception LLE Coordination: decreased gross motor    ADLs  Overall ADL's : Needs assistance/impaired Eating/Feeding: Modified independent, Sitting, Bed level Grooming: Contact guard assist, Standing, Wash/dry hands, Oral care Grooming Details (indicate cue type and reason): noted mild undershooting with reaching toward items at sink witgh LUE; leans forward supporting self on sink with hips Upper Body Bathing: Set up, Sitting Lower Body Bathing: Minimal assistance, Sit to/from stand Upper Body Dressing : Set up, Sitting Lower Body Dressing: Minimal assistance, Sit to/from stand Toilet Transfer: Minimal assistance, Rolling walker (2 wheels), Ambulation Functional mobility during ADLs: Minimal assistance, Rolling walker (2 wheels)    Mobility  Overal bed mobility: Needs Assistance Bed Mobility: Supine to Sit Supine to sit: Contact guard, HOB elevated Sit to supine: Contact guard assist General bed mobility comments: incr time and effort; no use of rails    Transfers  Overall transfer level: Needs assistance Equipment used: Rolling walker (2 wheels) Transfers: Sit to/from Stand Sit to Stand: Min assist General transfer comment: max directional verbal cues, increased time, tactile cues  for hand placement. completed 3 sit to stands    Ambulation / Gait / Stairs / Psychologist, [facility]  Ambulation/Gait Ambulation/Gait assistance: Editor, Commissioning (Feet): 12 Feet (toileted, 12) Assistive device: Rolling walker (2 wheels) Gait Pattern/deviations: Step-through pattern, Decreased stride length, Decreased stance time - left General Gait Details: no buckling of either LE; flexed posture and tendency to push RW too far ahead (can correct with cues) Gait velocity: dec Gait velocity interpretation: <1.31 ft/sec, indicative of household ambulator    Posture / Balance Balance Overall balance assessment: Needs assistance Sitting-balance support: Feet supported Sitting balance-Leahy  Scale: Fair Standing balance support: Bilateral upper extremity supported, During functional activity, Reliant on assistive device for balance Standing balance-Leahy Scale: Poor Standing balance comment: reliant on RW or grab bar in bathroom    Special considerations/life events  NA   Previous Home Environment (from acute therapy documentation) Living Arrangements: Alone  Lives With: Alone Available Help at Discharge: Family, Available PRN/intermittently Type of Home: Apartment Home Layout: One level Home Access: Level entry Bathroom Shower/Tub: Engineer, [employer] Systems: Standard Bathroom Accessibility: Yes How Accessible: Accessible via walker Home Care Services: No  Discharge Living Setting Does the patient have any problems obtaining your medications?: No  Social/Family/Support Systems    Goals    Decrease burden of Care through IP rehab admission: NA  Possible need for SNF placement upon discharge: Not anticipated  Patient Condition: {PATIENT'S CONDITION:22832}  Preadmission Screen Completed By:  Tinnie SHAUNNA Yvone Delayne, 09/25/2024 3:47 PM ______________________________________________________________________   Discussed status with Dr. PIERRETTE on *** at *** and received approval for admission today.  Admission Coordinator:  Tinnie SHAUNNA Yvone Delayne, CCC-SLP, time ***/Date ***   Assessment/Plan: Diagnosis: *** Does the need for close, 24 hr/day Medical supervision in concert with the patient's rehab needs make it unreasonable for this patient to be served in a less intensive setting? {yes_no_potentially:3041433} Co-Morbidities requiring supervision/potential complications: *** Due to {due un:6958565}, does the patient require 24 hr/day rehab nursing? {yes_no_potentially:3041433} Does the patient require coordinated care of a physician, rehab nurse, PT, OT, and SLP to address physical and functional deficits in the context of the above medical diagnosis(es)?  {yes_no_potentially:3041433} Addressing deficits in the following areas: {deficits:3041436} Can the patient actively participate in an intensive therapy program of at least 3 hrs of therapy 5 days a week? {yes_no_potentially:3041433} The potential for patient to make measurable gains while on inpatient rehab is {potential:3041437} Anticipated functional outcomes upon discharge from inpatient rehab: {functional outcomes:304600100} PT, {functional outcomes:304600100} OT, {functional outcomes:304600100} SLP Estimated rehab length of stay to reach the above functional goals is: *** Anticipated discharge destination: {anticipated dc setting:21604} 10. Overall Rehab/Functional Prognosis: {potential:3041437}   MD Signature: ***    [1]  Current Facility-Administered Medications:    acetaminophen  (TYLENOL ) tablet 650 mg, 650 mg, Oral, Q4H PRN **OR** acetaminophen  (TYLENOL ) 160 MG/5ML solution 650 mg, 650 mg, Per Tube, Q4H PRN **OR** acetaminophen  (TYLENOL ) suppository 650 mg, 650 mg, Rectal, Q4H PRN, Alghanim, Fahid, MD   apixaban  (ELIQUIS ) tablet 5 mg, 5 mg, Oral, BID, Shafer, Devon, NP, 5 mg at 09/25/24 0932   atorvastatin  (LIPITOR) tablet 40 mg, 40 mg, Oral, Daily, Alghanim, Fahid, MD, 40 mg at 09/25/24 9066   docusate sodium  (COLACE) capsule 100 mg, 100 mg, Oral, BID PRN, Stretch, Lamar PARAS, MD   hydrALAZINE  (APRESOLINE ) injection 10 mg, 10 mg, Intravenous, Q6H PRN, Toberman, Stevi W, NP, 10 mg at 09/25/24 0341   labetalol  (NORMODYNE ) injection 10 mg, 10 mg, Intravenous, Q2H PRN,  Toberman, Stevi W, NP   losartan  (COZAAR ) tablet 100 mg, 100 mg, Oral, q morning, Shafer, [place], NP, 100 mg at 09/25/24 9066   Oral care mouth rinse, 15 mL, Mouth Rinse, PRN, Arora, Ashish, MD   polyethylene glycol (MIRALAX  / GLYCOLAX ) packet 17 g, 17 g, Oral, Daily PRN, Stretch, Lamar PARAS, MD   sertraline  (ZOLOFT ) tablet 100 mg, 100 mg, Oral, Daily PRN, Alva, Rakesh V, MD, 100 mg at 09/24/24 1314

## 2024-09-25 NOTE — Evaluation (Signed)
 Speech Language Pathology Evaluation Patient Details Name: Kelsey Bridges MRN: [id number] DOB: 1951/02/24 Today's Date: 09/25/2024 Time: 8852-8784 SLP Time Calculation (min) (ACUTE ONLY): 28 min  Problem List:  Patient Active Problem List   Diagnosis Date Noted   Acute ischemic stroke (HCC) 09/23/2024   Ischemic stroke (HCC) 09/23/2024   History of colon cancer 09/25/2022   Aortic atherosclerosis 09/25/2022   Genetic testing 08/20/2021   Family history of breast cancer 08/07/2021   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 07/21/2021   Chronic anticoagulation 07/21/2021   Malnutrition of moderate degree 07/18/2021   S/P left hemicolectomy 07/17/2021   Osteoarthritis of right knee 06/15/2021   Mitral regurgitation 06/04/2021   Essential hypertension 05/15/2020   Chronic atrial fibrillation (HCC) 05/15/2020   Hyperlipidemia 05/15/2020   Tobacco abuse 05/15/2020   Pain in right knee 11/22/2019   Carpal tunnel syndrome on right 12/09/2015   Right hand pain 12/09/2015   LSIL (low grade squamous intraepithelial lesion) on Pap smear 06/08/2011   Past Medical History:  Past Medical History:  Diagnosis Date   Abnormal Pap smear    Allergy    Atrial fibrillation (HCC)    Dysrhythmia    Family history of breast cancer    Hammer toe    History of colon cancer    Hypertension    Internal hemorrhoids    Osteoarthritis    Past Surgical History:  Past Surgical History:  Procedure Laterality Date   ABDOMINAL HYSTERECTOMY     COLONOSCOPY     FLEXIBLE SIGMOIDOSCOPY N/A 07/17/2021   Procedure: FLEXIBLE SIGMOIDOSCOPY;  Surgeon: Teresa Lonni HERO, MD;  Location: [facility];  Service: General;  Laterality: N/A;   HAMMER TOE SURGERY Right 2019   IR IMAGING GUIDED PORT INSERTION  08/26/2021   IR REMOVAL TUN ACCESS W/ PORT W/O FL MOD SED  05/27/2022   LAPAROSCOPIC SUPRACERVICAL HYSTERECTOMY     left ovary  10/12/1986   Varicose veins     HPI:  73 yo F presented  from home to [facility] 12/12 with LLE weakness and slurred speech BP 229/202, MRI acute infarct R posterior corona radiata and basal ganglia transfer to [facility] 12/13  NIH 3. PMH: essential HTN, afib, nicotine dependence, colorectal CA s/p L colectomy 2022, OA.   Assessment / Plan / Recommendation Clinical Impression  Pt denied current difficulty in speech-language-cognition since stroke and lives alone, responsible for her ADL's. Speech and language are appropriate. On SLP arrival pt was reviewing menu, preparing to call order as she as been in ICU, able to find number and state steps to order.  She scored in average range on the Cognistat assessment. She independently stated strategies learned for use of rolling walker with PT. She did exhibit difficutly with several tasks one that required her to hold more multiple pieces of information for working memory or higher level attention on calculation tasks on SLUMS subtest (correct with repetition of info and additional time). Decreased spacing of numbers on clock drawing, correct placement of hands but incorrect length stated she would not have had problems with this prior to stroke. Cognitively appears functional on the acute venue and would benefit from ST at next venue to care to facilitate more complex cognition for goal of returning home and increasing independence/safety.    SLP Assessment  SLP Recommendation/Assessment: All further Speech Language Pathology needs can be addressed in the next venue of care SLP Visit Diagnosis: Cognitive communication deficit (R41.841)     Assistance Recommended at  Discharge  Set up Supervision/Assistance  Functional Status Assessment Patient has not had a recent decline in their functional status  Frequency and Duration           SLP Evaluation Cognition  Overall Cognitive Status: Impaired/Different from baseline (functional for acute setting- see impression statement) Arousal/Alertness: Awake/alert Orientation  Level: Oriented X4 Year: 2025 Month: December Day of Week: Correct Attention: Sustained Sustained Attention: Appears intact Memory:  (4/4 word recall, recalled strategies using rolling walker, difficulty recalling info for math word problem x 1) Awareness: Appears intact Problem Solving:  (intact except for clock drawing task) Safety/Judgment: Appears intact       Comprehension  Auditory Comprehension Overall Auditory Comprehension: Appears within functional limits for tasks assessed Commands:  (multi step on Cognistat 100%) Visual Recognition/Discrimination Discrimination: Not tested Reading Comprehension Reading Status: Not tested    Expression Expression Primary Mode of Expression: Verbal Verbal Expression Overall Verbal Expression: Appears within functional limits for tasks assessed Written Expression Dominant Hand: Right Written Expression: Not tested   Oral / Motor  Oral Motor/Sensory Function Overall Oral Motor/Sensory Function: Mild impairment Facial ROM: Reduced left;Suspected CN VII (facial) dysfunction Facial Symmetry: Abnormal symmetry left;Suspected CN VII (facial) dysfunction Lingual ROM: Within Functional Limits Lingual Symmetry: Within Functional Limits Motor Speech Overall Motor Speech: Appears within functional limits for tasks assessed            Dustin Olam Bull 09/25/2024, 1:52 PM

## 2024-09-25 NOTE — TOC CM/SW Note (Signed)
 Transition of Care [facility]) - Inpatient Brief Assessment   Patient Details  Name: Kelsey Bridges MRN: [id number] Date of Birth: 30-Aug-1951  Transition of Care [facility]) CM/SW Contact:    Andrez Kelsey George, RN Phone Number: 09/25/2024, 3:45 PM   Clinical Narrative:  Current plan is for CIR to eval.  IP Care management following.  Transition of Care Asessment: Insurance and Status: Insurance coverage has been reviewed Patient has primary care physician: Yes Home environment has been reviewed: home alone   Prior/Current Home Services: No current home services Social Drivers of Health Review: SDOH reviewed no interventions necessary Readmission risk has been reviewed: Yes Transition of care needs: transition of care needs identified, TOC will continue to follow

## 2024-09-25 NOTE — Progress Notes (Signed)
 eLink Physician-Brief Progress Note Patient Name: Kelsey Bridges DOB: Jul 28, 1951 MRN: [id number]   Date of Service  09/25/2024  HPI/Events of Note  73 y/o F with a PMH significant for HTN, atrial fibrillation on Eliquis , nicotine dependence with current use, colorectal cancer stage III b s/p left collectomy 07/17/2021 and chemoRx who presents for acute onset left lower extremity weakness and dysarthria.   Has Nausea and vomiting  eICU Interventions  Zofran  x1     Intervention Category Minor Interventions: Routine modifications to care plan (e.g. PRN medications for pain, fever)  Jennalee Greaves 09/25/2024, 5:37 AM

## 2024-09-25 NOTE — Progress Notes (Signed)
 Physical Therapy Treatment Patient Details Name: Kelsey Bridges MRN: [id number] DOB: 1951-09-13 Today's Date: 09/25/2024   History of Present Illness 73 yo F presented to [facility] 12/12 with LLE weakness and slurred speech BP 229/202, MRI Acute infarct R posterior corona radiata and basal ganglia transfer to [facility] 12/13  NIH 3 PMH essential HTN, afib on eliquis , nicotine dependence colorectal CA s/p L colectomy 2022, OA    PT Comments  On arrival, pt requesting assistance to go to restroom. With each of 3 transfers she required vc's for proper sequencing/use of RW and min assist for boosting/balance. Ambulated in room with RW and min assist with cues for proper use of RW. After assisted out of bathroom, assisted to sit at EOB while a recliner was found for pt's room. Patient assisted to recliner and eager to do as much of my bath as I can do. Set up for bath with chair alarm in place and pt aware not to stand up while bathing. NT made aware.     If plan is discharge home, recommend the following: A little help with bathing/dressing/bathroom;Assistance with cooking/housework;Supervision due to cognitive status;Assist for transportation;A little help with walking and/or transfers   Can travel by private vehicle        Equipment Recommendations   (TBD at next venue)    Recommendations for Other Services       Precautions / Restrictions Precautions Precautions: Fall Recall of Precautions/Restrictions: Intact Precaution/Restrictions Comments: L sided weakness Restrictions Weight Bearing Restrictions Per Provider Order: No     Mobility  Bed Mobility Overal bed mobility: Needs Assistance Bed Mobility: Supine to Sit     Supine to sit: Contact guard, HOB elevated     General bed mobility comments: incr time and effort; no use of rails    Transfers Overall transfer level: Needs assistance Equipment used: Rolling walker (2 wheels) Transfers: Sit to/from Stand Sit to Stand: Min  assist           General transfer comment: max directional verbal cues, increased time, tactile cues for hand placement. completed 3 sit to stands    Ambulation/Gait Ambulation/Gait assistance: Min assist Gait Distance (Feet): 12 Feet (toileted, 12) Assistive device: Rolling walker (2 wheels) Gait Pattern/deviations: Step-through pattern, Decreased stride length, Decreased stance time - left Gait velocity: dec     General Gait Details: no buckling of either LE; flexed posture and tendency to push RW too far ahead (can correct with cues)   Stairs             Wheelchair Mobility     Tilt Bed    Modified Rankin (Stroke Patients Only) Modified Rankin (Stroke Patients Only) Pre-Morbid Rankin Score: No significant disability Modified Rankin: Moderately severe disability     Balance Overall balance assessment: Needs assistance Sitting-balance support: Feet supported Sitting balance-Leahy Scale: Fair     Standing balance support: Bilateral upper extremity supported, During functional activity, Reliant on assistive device for balance Standing balance-Leahy Scale: Poor Standing balance comment: reliant on RW or grab bar in bathroom                            Communication Communication Communication: Impaired Factors Affecting Communication: Difficulty expressing self (mild difficulty with word finding)  Cognition Arousal: Alert Behavior During Therapy: [facility] for tasks assessed/performed  Following commands: Impaired Following commands impaired: Follows one step commands with increased time    Cueing Cueing Techniques: Verbal cues, Gestural cues  Exercises      General Comments General comments (skin integrity, edema, etc.): VSS      Pertinent Vitals/Pain Pain Assessment Pain Assessment: No/denies pain    Home Living Family/patient expects to be discharged to:: Private residence Living Arrangements:  Alone Available Help at Discharge: Family;Available PRN/intermittently Type of Home: Apartment (pt reports senior living complex) Home Access: Level entry       Home Layout: One level Home Equipment: Cane - single point      Prior Function            PT Goals (current goals can now be found in the care plan section) Acute Rehab PT Goals Patient Stated Goal: home Time For Goal Achievement: 10/08/24 Potential to Achieve Goals: Good Progress towards PT goals: Progressing toward goals    Frequency    Min 3X/week      PT Plan      Co-evaluation              AM-PAC PT 6 Clicks Mobility   Outcome Measure  Help needed turning from your back to your side while in a flat bed without using bedrails?: A Little Help needed moving from lying on your back to sitting on the side of a flat bed without using bedrails?: A Little Help needed moving to and from a bed to a chair (including a wheelchair)?: A Little Help needed standing up from a chair using your arms (e.g., wheelchair or bedside chair)?: A Little Help needed to walk in hospital room?: A Little Help needed climbing 3-5 steps with a railing? : A Lot 6 Click Score: 17    End of Session Equipment Utilized During Treatment: Gait belt Activity Tolerance: Patient tolerated treatment well Patient left: in chair;with call bell/phone within reach;with chair alarm set Nurse Communication: Mobility status PT Visit Diagnosis: Unsteadiness on feet (R26.81)     Time: 8961-8893 PT Time Calculation (min) (ACUTE ONLY): 28 min  Charges:    $Gait Training: 8-22 mins (full time non-billable) PT General Charges $$ ACUTE PT VISIT: 1 Visit                      Macario RAMAN, PT Acute Rehabilitation Services  Office [phone]    Macario SHAUNNA Soja 09/25/2024, 11:16 AM

## 2024-09-26 NOTE — Progress Notes (Signed)
 Physical Therapy Treatment Patient Details Name: AMALIE KORAN MRN: [id number] DOB: 1951/09/19 Today's Date: 09/26/2024   History of Present Illness 73 yo F presented to [facility] 12/12 with LLE weakness and slurred speech BP 229/202, MRI Acute infarct R posterior corona radiata and basal ganglia transfer to [facility] 12/13  NIH 3 PMH essential HTN, afib on eliquis , nicotine dependence colorectal CA s/p L colectomy 2022, OA    PT Comments  Pt tolerated treatment well today. Pt today able to progress ambulation in hallway with RW Min A. Constant cues for proximity to RW. Pt noted to drift to the R and required mod cues for navigating obstacles. 2 LOB noted requiring Min A to correct. Pt also noted with LLE externally rotated requiring cues for sequencing with ambulating. No change in DC/DME recs at this time. PT will continue to follow.     If plan is discharge home, recommend the following: A little help with bathing/dressing/bathroom;Assistance with cooking/housework;Supervision due to cognitive status;Assist for transportation;A little help with walking and/or transfers   Can travel by private vehicle     No  Equipment Recommendations  Rolling walker (2 wheels)    Recommendations for Other Services       Precautions / Restrictions Precautions Precautions: Fall Recall of Precautions/Restrictions: Intact Precaution/Restrictions Comments: L sided weakness Restrictions Weight Bearing Restrictions Per Provider Order: No     Mobility  Bed Mobility               General bed mobility comments: Up in recliner    Transfers Overall transfer level: Needs assistance Equipment used: Rolling walker (2 wheels) Transfers: Sit to/from Stand Sit to Stand: Min assist           General transfer comment: max directional verbal cues, increased time, tactile cues for hand placement.    Ambulation/Gait Ambulation/Gait assistance: Min assist Gait Distance (Feet): 100 Feet Assistive  device: Rolling walker (2 wheels) Gait Pattern/deviations: Step-through pattern, Decreased stride length, Decreased stance time - left, Drifts right/left, Narrow base of support Gait velocity: dec     General Gait Details: Constant cues for proximity to RW. Pt noted to drift to the R and required mod cues for navigating obstacles. 2 LOB noted requiring Min A to correct. Pt also noted with LLE externally rotated requiring cues for sequencing with ambulating.   Stairs             Wheelchair Mobility     Tilt Bed    Modified Rankin (Stroke Patients Only) Modified Rankin (Stroke Patients Only) Pre-Morbid Rankin Score: No significant disability Modified Rankin: Moderately severe disability     Balance Overall balance assessment: Needs assistance Sitting-balance support: Feet supported Sitting balance-Leahy Scale: Fair     Standing balance support: Bilateral upper extremity supported, During functional activity, Reliant on assistive device for balance Standing balance-Leahy Scale: Poor Standing balance comment: reliant on RW or grab bar in bathroom                            Communication Communication Communication: Impaired Factors Affecting Communication: Difficulty expressing self  Cognition Arousal: Alert Behavior During Therapy: WFL for tasks assessed/performed   PT - Cognitive impairments: No family/caregiver present to determine baseline                       PT - Cognition Comments: Delayed processing noted and poor safety awareness. Following commands: Impaired Following commands impaired: Follows one  step commands with increased time    Cueing Cueing Techniques: Verbal cues, Gestural cues  Exercises      General Comments General comments (skin integrity, edema, etc.): VSS      Pertinent Vitals/Pain Pain Assessment Pain Assessment: No/denies pain    Home Living                          Prior Function             PT Goals (current goals can now be found in the care plan section) Progress towards PT goals: Progressing toward goals    Frequency    Min 3X/week      PT Plan      Co-evaluation              AM-PAC PT 6 Clicks Mobility   Outcome Measure  Help needed turning from your back to your side while in a flat bed without using bedrails?: A Little Help needed moving from lying on your back to sitting on the side of a flat bed without using bedrails?: A Little Help needed moving to and from a bed to a chair (including a wheelchair)?: A Little Help needed standing up from a chair using your arms (e.g., wheelchair or bedside chair)?: A Little Help needed to walk in hospital room?: A Little Help needed climbing 3-5 steps with a railing? : A Lot 6 Click Score: 17    End of Session Equipment Utilized During Treatment: Gait belt Activity Tolerance: Patient tolerated treatment well Patient left: in chair;with call bell/phone within reach;with chair alarm set Nurse Communication: Mobility status PT Visit Diagnosis: Unsteadiness on feet (R26.81)     Time: 8884-8865 PT Time Calculation (min) (ACUTE ONLY): 19 min  Charges:    $Gait Training: 8-22 mins PT General Charges $$ ACUTE PT VISIT: 1 Visit                     Sueellen NOVAK, PT, DPT Acute Rehab Services [phone]    Venetta Knee 09/26/2024, 12:30 PM

## 2024-09-26 NOTE — Progress Notes (Signed)
 PROGRESS NOTE  Kelsey Bridges  DOB: 19-Sep-1951  PCP: Duwaine Annabella SAILOR, FNP FMW:[id number]  DOA: 09/22/2024  LOS: 3 days  Hospital Day: 5  Subjective: Patient was seen and examined this morning. Afebrile, hemodynamically stable Sitting up in recliner.  Not in distress.  Brief narrative: Kelsey Bridges is a 73 y.o. female with PMH significant for HTN, A-fib on Eliquis , current smoker, h/o colorectal cancer stage III b s/p left collectomy 07/17/2021 and chemoRx  Reports compliance to medications including Eliquis .  12/12, patient presented to the ED with complaint of acute onset left lower extremity weakness and dysarthria that was started 4 hours prior to presentation.  She had a sudden weakness in the left leg, was unable to stand.  Also experienced slurred speech, a feeling of 'heavy and obstructed tongue'.  Tried to drink water and eat drooled on the side of her face.   Initial blood pressure in the ED was 229/202. She was started on Cardene  drip CT head was unremarkable CTA head and neck showed moderate stenosis of the right PCA distal P1 segment. MRI brain showed acute infarct involving the posterior right coronary D10 basal ganglia.  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  Imagings as above Stroke workup completed Echo with EF 65 to 70%, normal left atrium, no evidence of interatrial shunt. LDL 102, A1c 5.3 Neurology recommended to continue Eliquis  as before Seen by PT/OT.  CIR recommended  A-fib Patient states she was not taking her Cardizem . Currently not on any AV nodal blocking agent. Continue anticoagulation with Eliquis  as before  Hypertensive Emergency Initial blood pressure in the ED was over 220 systolic.    Was started on nicardipine  drip and admitted to ICU.  Eventually blood pressure improved. Currently blood pressure stable on losartan  100 mg daily  HLD Lipitor 40 mg daily  Abnormal urinalysis Urinalysis on admission showed clear straw-colored urine with trace leukocytes, positive nitrate, rare bacteria.  It seems urine culture was not sent.  She was not started on antibiotics Did not complete any symptoms today to me   Anxiety  Continue Zoloft  100 mg daily PRN  Nutrition Status:         Mobility:   PT Orders: Active   PT Follow up Rec: Acute Inpatient Rehab (3hours/Day)09/26/2024 1200    Goals of care   Code Status: Full Code     DVT prophylaxis:  SCD's Start: 09/23/24 0749 apixaban  (ELIQUIS ) tablet 5 mg   Antimicrobials: None Fluid: None Consultants: Neurology Family Communication: None at bedside  Status: Inpatient Level of care:  Telemetry   Patient is from: Home Needs to continue in-hospital care: Medically stable for discharge Anticipated d/c to: Pending CIR      Diet:  Diet Order             Diet Heart Room service appropriate? Yes; Fluid consistency: Thin  Diet effective now                   Scheduled Meds:  apixaban   5 mg Oral BID   atorvastatin   40 mg Oral Daily   losartan   100 mg Oral q morning    PRN meds: acetaminophen  **OR** acetaminophen  (TYLENOL ) oral liquid 160 mg/5 mL **OR** acetaminophen , docusate sodium , hydrALAZINE , labetalol , mouth rinse, polyethylene glycol,  sertraline    Infusions:    Antimicrobials: Anti-infectives (From admission, onward)    None       Objective: Vitals:   09/26/24 0750 09/26/24 1148  BP: (!) 132/90 (!) 91/56  Pulse: 85 92  Resp: 16 14  Temp: 98.3 F (36.8 C) 98.2 F (36.8 C)  SpO2: 98% 100%    Intake/Output Summary (Last 24 hours) at 09/26/2024 1405 Last data filed at 09/26/2024 0800 Gross per 24 hour  Intake 480 ml  Output --  Net 480 ml   Filed Weights   09/23/24  0853 09/25/24 0500 09/26/24 0500  Weight: 77.1 kg 63.6 kg 63.5 kg   Weight change: -0.1 kg Body mass index is 26.45 kg/m.   Physical Exam: General exam: Pleasant, elderly African-American female.  Not in distress Skin: No rashes, lesions or ulcers. HEENT: Atraumatic, normocephalic, no obvious bleeding Lungs: Clear to auscultation bilaterally,  CVS: S1, S2, no murmur,   GI/Abd: Soft, nontender, nondistended, bowel sound present,   CNS: Alert, awake, oriented x 3.  Slow to respond Psychiatry: Mood appropriate Extremities: No pedal edema, no calf tenderness,   Data Review: I have personally reviewed the laboratory data and studies available.  F/u labs ordered Unresulted Labs (From admission, onward)    None       Signed, Chapman Rota, MD [facility] 09/26/2024

## 2024-09-26 NOTE — Plan of Care (Signed)
   Problem: Coping: Goal: Ability to adjust to condition or change in health will improve Outcome: Progressing   Problem: Fluid Volume: Goal: Ability to maintain a balanced intake and output will improve Outcome: Progressing

## 2024-09-26 NOTE — Consult Note (Signed)
 Physical Medicine and Rehabilitation Consult Reason for Consult: Left-sided weakness after stroke Referring Physician: Dahal   HPI: Kelsey Bridges is a 73 y.o. female with a history of hypertension, atrial fibrillation on Eliquis  and colorectal cancer status post left colectomy in 2022 who presented to [facility] on 09/22/2024 with left leg weakness and slurred speech.  CT of the head revealed no acute abnormality.  CTA of the head and neck revealed moderate stenosis of the right PCA distal P1 segment.  MRI revealed acute infarct involving the right posterior corona radiata and basal ganglia as well as chronic right cerebellar infarcts which were tiny.  Patient was on Eliquis  prior to admission and this was continued.  Etiology of stroke was likely due to poor compliance with Eliquis .  Other issues during his hospital stay include atrial fibrillation and hypertension.  Speech-language pathology was consulted for dysphagia and patient was advanced to regular diet.   Home: Home Living Family/patient expects to be discharged to:: Private residence Living Arrangements: Alone Available Help at Discharge: Family, Available PRN/intermittently Type of Home: Apartment Home Access: Level entry Home Layout: One level Bathroom Shower/Tub: Engineer, Manufacturing Systems: Standard Bathroom Accessibility: Yes Home Equipment: Medical Laboratory Scientific Officer - single point  Lives With: Alone  Functional History: Prior Function Prior Level of Function : Independent/Modified Independent Mobility Comments: no AD, started using cane a couple of days ago ADLs Comments: indep, drives Functional Status:  Mobility: Bed Mobility Overal bed mobility: Needs Assistance Bed Mobility: Supine to Sit Supine to sit: Contact guard, HOB elevated Sit to supine: Contact guard assist General bed mobility comments: Up in recliner Transfers Overall transfer level: Needs assistance Equipment used: Rolling walker (2  wheels) Transfers: Sit to/from Stand Sit to Stand: Min assist General transfer comment: max directional verbal cues, increased time, tactile cues for hand placement. Ambulation/Gait Ambulation/Gait assistance: Min assist Gait Distance (Feet): 100 Feet Assistive device: Rolling walker (2 wheels) Gait Pattern/deviations: Step-through pattern, Decreased stride length, Decreased stance time - left, Drifts right/left, Narrow base of support General Gait Details: Constant cues for proximity to RW. Pt noted to drift to the R and required mod cues for navigating obstacles. 2 LOB noted requiring Min A to correct. Pt also noted with LLE externally rotated requiring cues for sequencing with ambulating. Gait velocity: dec Gait velocity interpretation: <1.31 ft/sec, indicative of household ambulator    ADL: ADL Overall ADL's : Needs assistance/impaired Eating/Feeding: Modified independent, Sitting, Bed level Grooming: Contact guard assist, Standing, Wash/dry hands, Oral care Grooming Details (indicate cue type and reason): noted mild undershooting with reaching toward items at sink witgh LUE; leans forward supporting self on sink with hips Upper Body Bathing: Set up, Sitting Lower Body Bathing: Minimal assistance, Sit to/from stand Upper Body Dressing : Set up, Sitting Lower Body Dressing: Minimal assistance, Sit to/from stand Toilet Transfer: Minimal assistance, Rolling walker (2 wheels), Ambulation Functional mobility during ADLs: Minimal assistance, Rolling walker (2 wheels)  Cognition: Cognition Overall Cognitive Status: Impaired/Different from baseline (functional for acute setting- see impression statement) Arousal/Alertness: Awake/alert Orientation Level: Oriented X4 Year: 2025 Month: December Day of Week: Correct Attention: Sustained Sustained Attention: Appears intact Memory:  (4/4 word recall, recalled strategies using rolling walker, difficulty recalling info for math word problem  x 1) Awareness: Appears intact Problem Solving:  (intact except for clock drawing task) Safety/Judgment: Appears intact Cognition Arousal: Alert Behavior During Therapy: WFL for tasks assessed/performed Overall Cognitive Status: Impaired/Different from baseline (functional for acute setting- see impression statement)  Review of Systems  Constitutional: Negative.   HENT: Negative.    Eyes: Negative.   Respiratory: Negative.    Cardiovascular: Negative.   Gastrointestinal: Negative.   Genitourinary: Negative.   Musculoskeletal: Negative.   Skin: Negative.   Neurological:  Positive for sensory change, focal weakness and weakness.  Psychiatric/Behavioral: Negative.     Past Medical History:  Diagnosis Date   Abnormal Pap smear    Allergy    Atrial fibrillation (HCC)    Dysrhythmia    Family history of breast cancer    Hammer toe    History of colon cancer    Hypertension    Internal hemorrhoids    Osteoarthritis    Past Surgical History:  Procedure Laterality Date   ABDOMINAL HYSTERECTOMY     COLONOSCOPY     FLEXIBLE SIGMOIDOSCOPY N/A 07/17/2021   Procedure: FLEXIBLE SIGMOIDOSCOPY;  Surgeon: Teresa Lonni HERO, MD;  Location: [facility];  Service: General;  Laterality: N/A;   HAMMER TOE SURGERY Right 2019   IR IMAGING GUIDED PORT INSERTION  08/26/2021   IR REMOVAL TUN ACCESS W/ PORT W/O FL MOD SED  05/27/2022   LAPAROSCOPIC SUPRACERVICAL HYSTERECTOMY     left ovary  10/12/1986   Varicose veins     Family History  Adopted: Yes  Problem Relation Age of Onset   Cancer Sister        breast   Cancer Brother        liver   Social History:  reports that she has been smoking cigarettes. Her smokeless tobacco use includes snuff. She reports current alcohol use. She reports that she does not use drugs. Allergies: Allergies[1] Medications Prior to Admission  Medication Sig Dispense Refill   ELIQUIS  5 MG TABS tablet TAKE 1 TABLET TWICE DAILY 180 tablet 3   ferrous sulfate   325 (65 FE) MG tablet Take 325 mg by mouth every morning.     losartan  (COZAAR ) 100 MG tablet Take 100 mg by mouth every morning.     augmented betamethasone dipropionate (DIPROLENE-AF) 0.05 % ointment Apply topically daily. (Patient not taking: Reported on 09/23/2024)     diltiazem  (CARDIZEM  CD) 240 MG 24 hr capsule TAKE 1 CAPSULE EVERY DAY (Patient not taking: Reported on 09/23/2024) 90 capsule 3   meloxicam (MOBIC) 15 MG tablet Take 15 mg by mouth daily as needed (knee pain). (Patient not taking: Reported on 09/23/2024)     mirabegron ER (MYRBETRIQ) 25 MG TB24 tablet  (Patient not taking: Reported on 09/23/2024)     mirabegron ER (MYRBETRIQ) 50 MG TB24 tablet  (Patient not taking: Reported on 09/23/2024)     nitrofurantoin, macrocrystal-monohydrate, (MACROBID) 100 MG capsule Take 100 mg by mouth 2 (two) times daily. (Patient not taking: Reported on 09/23/2024)     potassium chloride  SA (KLOR-CON  M) 20 MEQ tablet Take 1 tablet (20 mEq total) by mouth daily. (Patient not taking: Reported on 09/23/2024) 30 tablet 1   prochlorperazine  (COMPAZINE ) 10 MG tablet Take 1 tablet (10 mg total) by mouth every 6 (six) hours as needed for nausea or vomiting. (Patient not taking: Reported on 10/23/2022) 30 tablet 0   sertraline  (ZOLOFT ) 100 MG tablet Take 100 mg by mouth daily as needed (anxiety). (Patient not taking: Reported on 09/23/2024)       Blood pressure (!) 91/56, pulse 92, temperature 98.2 F (36.8 C), temperature source Oral, resp. rate 14, height 5' 1 (1.549 m), weight 63.5 kg, SpO2 100%. Physical Exam Constitutional:      General: She  is not in acute distress.    Appearance: She is not ill-appearing.  HENT:     Head: Normocephalic and atraumatic.     Right Ear: External ear normal.     Left Ear: External ear normal.     Nose: Nose normal.     Mouth/Throat:     Mouth: Mucous membranes are moist.  Eyes:     Conjunctiva/sclera: Conjunctivae normal.  Cardiovascular:     Rate and Rhythm:  Normal rate.  Pulmonary:     Effort: Pulmonary effort is normal.  Abdominal:     Palpations: Abdomen is soft.  Musculoskeletal:        General: No swelling, tenderness or deformity. Normal range of motion.     Cervical back: Normal range of motion.  Skin:    General: Skin is warm.  Neurological:     Mental Status: She is alert.     Comments: Alert and oriented x 3. Normal insight and awareness. Intact Memory. Normal language and speech. Cranial nerve exam unremarkable. MMT: RUE 5/5.LUE 4/5 with pronator drift and decreased FMC. RLE 4/5 prox to 5/5 distal. LLE 4- prox to 4/5 distally. Sensation 1+/2 Left leg and arm. Has superimposed stocking glove sensory loss over toes of both feet. No abnl muscle tone. No obvious ataxia. Kelsey Bridges    Psychiatric:        Mood and Affect: Mood normal.        Behavior: Behavior normal.     No results found for this or any previous visit (from the past 24 hours). ECHOCARDIOGRAM COMPLETE Result Date: 09/25/2024    ECHOCARDIOGRAM REPORT   Patient Name:   Kelsey Bridges Date of Exam: 09/25/2024 Medical Rec #:  [id number]           Height:       61.0 in Accession #:    [id number]          Weight:       140.2 lb Date of Birth:  11/02/1950            BSA:          1.624 m Patient Age:    73 years            BP:           117/79 mmHg Patient Gender: F                   HR:           93 bpm. Exam Location:  Inpatient Procedure: 2D Echo, Cardiac Doppler and Color Doppler (Both Spectral and Color            Flow Doppler were utilized during procedure). Indications:    Stroke  History:        Patient has prior history of Echocardiogram examinations, most                 recent 06/23/2022. Stroke; Risk Factors:Current Smoker and                 Dyslipidemia. Cancer.  Sonographer:    Ellouise Mose RDCS Referring Phys: [id number] [facility]  Sonographer Comments: Technically difficult study due to poor echo windows. 15 min delay for meds. IMPRESSIONS  1. Left ventricular ejection  fraction, by estimation, is 65 to 70%. Left ventricular ejection fraction by 2D MOD biplane is 71.7 %. The left ventricle has normal function. The left ventricle has no regional wall motion abnormalities. Left ventricular diastolic  function could not be evaluated.  2. Right ventricular systolic function is normal. The right ventricular size is normal. There is normal pulmonary artery systolic pressure. The estimated right ventricular systolic pressure is 24.7 mmHg.  3. The mitral valve is degenerative. No evidence of mitral valve regurgitation. No evidence of mitral stenosis.  4. The aortic valve was not well visualized. Aortic valve regurgitation is mild. No aortic stenosis is present. Comparison(s): No significant change from prior study. Intermittently in atrial flutter with variable block during evaluation. FINDINGS  Left Ventricle: Left ventricular ejection fraction, by estimation, is 65 to 70%. Left ventricular ejection fraction by 2D MOD biplane is 71.7 %. The left ventricle has normal function. The left ventricle has no regional wall motion abnormalities. The left ventricular internal cavity size was normal in size. There is no left ventricular hypertrophy. Left ventricular diastolic function could not be evaluated due to atrial fibrillation. Left ventricular diastolic function could not be evaluated. Right Ventricle: The right ventricular size is normal. No increase in right ventricular wall thickness. Right ventricular systolic function is normal. There is normal pulmonary artery systolic pressure. The tricuspid regurgitant velocity is 2.33 m/s, and  with an assumed right atrial pressure of 3 mmHg, the estimated right ventricular systolic pressure is 24.7 mmHg. Left Atrium: Left atrial size was normal in size. Right Atrium: Right atrial size was normal in size. Pericardium: There is no evidence of pericardial effusion. Mitral Valve: The mitral valve is degenerative in appearance. No evidence of mitral  valve regurgitation. No evidence of mitral valve stenosis. Tricuspid Valve: The tricuspid valve is normal in structure. Tricuspid valve regurgitation is not demonstrated. No evidence of tricuspid stenosis. Aortic Valve: The aortic valve was not well visualized. Aortic valve regurgitation is mild. No aortic stenosis is present. Pulmonic Valve: The pulmonic valve was not well visualized. Pulmonic valve regurgitation is not visualized. Aorta: The aortic root and ascending aorta are structurally normal, with no evidence of dilitation. IAS/Shunts: No atrial level shunt detected by color flow Doppler.  LEFT VENTRICLE PLAX 2D                        Biplane EF (MOD) LVIDd:         3.30 cm         LV Biplane EF:   Left LVIDs:         1.90 cm                          ventricular LV PW:         1.60 cm                          ejection LV IVS:        1.30 cm                          fraction by LVOT diam:     2.00 cm                          2D MOD LV SV:         44                               biplane is LV SV Index:   27  71.7 %. LVOT Area:     3.14 cm  LV Volumes (MOD) LV vol d, MOD    23.7 ml A2C: LV vol d, MOD    33.1 ml A4C: LV vol s, MOD    5.9 ml A2C: LV vol s, MOD    10.7 ml A4C: LV SV MOD A2C:   17.8 ml LV SV MOD A4C:   33.1 ml LV SV MOD BP:    20.8 ml RIGHT VENTRICLE             IVC RV S prime:     12.60 cm/s  IVC diam: 1.30 cm TAPSE (M-mode): 1.2 cm LEFT ATRIUM             Index        RIGHT ATRIUM          Index LA diam:        3.65 cm 2.25 cm/m   RA Area:     8.83 cm LA Vol (A2C):   50.3 ml 30.97 ml/m  RA Volume:   14.90 ml 9.17 ml/m LA Vol (A4C):   49.4 ml 30.42 ml/m LA Biplane Vol: 51.2 ml 31.52 ml/m  AORTIC VALVE LVOT Vmax:   94.83 cm/s LVOT Vmean:  64.967 cm/s LVOT VTI:    0.139 m  AORTA Ao Root diam: 3.00 cm Ao Asc diam:  3.20 cm MITRAL VALVE               TRICUSPID VALVE MV Area (PHT): 7.16 cm    TR Peak grad:   21.7 mmHg MV Decel Time: 106 msec    TR Vmax:         233.00 cm/s MV E velocity: 84.90 cm/s                            SHUNTS                            Systemic VTI:  0.14 m                            Systemic Diam: 2.00 cm Franck Azobou Tonleu Electronically signed by Joelle Cedars Tonleu Signature Date/Time: 09/25/2024/11:11:36 AM    Final     Assessment/Plan: Diagnosis: 72 year old female with right basal ganglia and corona radiata infarcts likely due to noncompliance with Eliquis  Does the need for close, 24 hr/day medical supervision in concert with the patient's rehab needs make it unreasonable for this patient to be served in a less intensive setting? Yes Co-Morbidities requiring supervision/potential complications:  - Labile hypertension -Atrial fibrillation Due to bladder management, bowel management, safety, skin/wound care, disease management, medication administration, pain management, and patient education, does the patient require 24 hr/day rehab nursing? Yes Does the patient require coordinated care of a physician, rehab nurse, therapy disciplines of PT, OT to address physical and functional deficits in the context of the above medical diagnosis(es)? Yes Addressing deficits in the following areas: balance, endurance, locomotion, strength, transferring, bowel/bladder control, bathing, dressing, feeding, grooming, toileting, and psychosocial support Can the patient actively participate in an intensive therapy program of at least 3 hrs of therapy per day at least 5 days per week? Yes The potential for patient to make measurable gains while on inpatient rehab is excellent Anticipated functional outcomes upon discharge from inpatient rehab are modified independent  with PT, modified  independent with OT, n/a with SLP. Estimated rehab length of stay to reach the above functional goals is: 7 days Anticipated discharge destination: Home Overall Rehab/Functional Prognosis: excellent  POST ACUTE RECOMMENDATIONS: This patient's condition is  appropriate for continued rehabilitative care in the following setting: CIR Patient has agreed to participate in recommended program. Yes Note that insurance prior authorization may be required for reimbursement for recommended care.  Comment: Pt very motivated. She lives alone but has supports who can provide her some assistance.  Rehab Admissions Coordinator to follow up.     I have personally performed a face to face diagnostic evaluation of this patient. Additionally, I have examined the patient's medical record including any pertinent labs and radiographic images.    Thanks,  Kelsey ONEIDA Gunther, MD 09/26/2024      [1]  Allergies Allergen Reactions   Penicillins Rash    Reaction to injection / does not react to tablets

## 2024-09-26 NOTE — Plan of Care (Signed)
   Problem: Education: Goal: Ability to describe self-care measures that may prevent or decrease complications (Diabetes Survival Skills Education) will improve Outcome: Progressing Goal: Individualized Educational Video(s) Outcome: Progressing

## 2024-09-26 NOTE — Progress Notes (Signed)
 Inpatient Rehab Admissions Coordinator:    CIR following. Will send case to insurance once rehab MD consult is complete.   Leita Kleine, MS, CCC-SLP Rehab Admissions Coordinator  [phone] (celll) [phone] (office)

## 2024-09-26 NOTE — Care Management Important Message (Signed)
 Important Message  Patient Details  Name: RUDI KNIPPENBERG MRN: [id number] Date of Birth: [birth year]   Important Message Given:  Yes - Medicare IM     Claretta Deed 09/26/2024, 3:06 PM

## 2024-09-27 ENCOUNTER — Other Ambulatory Visit (HOSPITAL_COMMUNITY): Payer: Self-pay

## 2024-09-27 DIAGNOSIS — I639 Cerebral infarction, unspecified: Secondary | ICD-10-CM | POA: Diagnosis not present

## 2024-09-27 NOTE — Plan of Care (Signed)

## 2024-09-27 NOTE — Progress Notes (Signed)
 Occupational Therapy Treatment Patient Details Name: Kelsey Bridges MRN: [id number] DOB: [birth year] Today's Date: 09/27/2024   History of present illness 73 yo F presented to [facility] 12/12 with LLE weakness and slurred speech BP 229/202, MRI Acute infarct R posterior corona radiata and basal ganglia transfer to [facility] 12/13  NIH 3 PMH essential HTN, afib on eliquis , nicotine dependence colorectal CA s/p L colectomy 2022, OA   OT comments  Pt progressing toward established OT goals. Facilitating strength and coordination with LUE HEP (below). Engaged in ADL with CGA. Pt then desiring to ambulate into hall, with OT providing focus on activity tolerance and optimizing awareness of L side of environment. Pt with bout of pausing and LE buckling needing min A for reorienting and keeping balance. Pt needing min A and use of RW with fatigue. Noting rehab >3 hours/day signed off. Patient will benefit from continued inpatient follow up therapy, <3 hours/day       If plan is discharge home, recommend the following:  A little help with walking and/or transfers;A little help with bathing/dressing/bathroom;Assistance with cooking/housework;Assist for transportation;Help with stairs or ramp for entrance   Equipment Recommendations  Other (comment) (defer)    Recommendations for Other Services      Precautions / Restrictions Precautions Precautions: Fall Recall of Precautions/Restrictions: Intact Precaution/Restrictions Comments: L sided weakness Restrictions Weight Bearing Restrictions Per Provider Order: No       Mobility Bed Mobility               General bed mobility comments: up in recliner    Transfers Overall transfer level: Needs assistance Equipment used: Rolling walker (2 wheels) Transfers: Sit to/from Stand Sit to Stand: Min assist           General transfer comment: for STS with mod cues for safety/hand placement esp with return to seated position     Balance Overall  balance assessment: Needs assistance Sitting-balance support: Feet supported Sitting balance-Leahy Scale: Fair     Standing balance support: Bilateral upper extremity supported, During functional activity, Reliant on assistive device for balance Standing balance-Leahy Scale: Poor Standing balance comment: reliant on RW                           ADL either performed or assessed with clinical judgement   ADL Overall ADL's : Needs assistance/impaired     Grooming: Wash/dry hands;Contact guard assist;Standing                   Toilet Transfer: Minimal assistance;Rolling walker (2 wheels);Ambulation                  Extremity/Trunk Assessment Upper Extremity Assessment Upper Extremity Assessment: LUE deficits/detail;Right hand dominant LUE Deficits / Details: 4/5 as compared to R 5/5; decr coordination LUE Sensation: decreased light touch;decreased proprioception LUE Coordination: decreased gross motor;decreased fine motor   Lower Extremity Assessment Lower Extremity Assessment: Defer to PT evaluation        Vision       Perception     Praxis     Communication Communication Communication: Impaired Factors Affecting Communication: Difficulty expressing self   Cognition Arousal: Alert Behavior During Therapy: WFL for tasks assessed/performed Cognition: Cognition impaired     Awareness: Online awareness impaired Memory impairment (select all impairments): Working civil service fast streamer (repeated cues for optimal technique during UE HEP) Attention impairment (select first level of impairment): Selective attention Executive functioning impairment (select all impairments): Organization  Following commands: Impaired Following commands impaired: Follows one step commands with increased time      Cueing   Cueing Techniques: Verbal cues, Gestural cues  Exercises Exercises: Other exercises Other Exercises Other Exercises: red theraband  LUE shoulder flexion x10 bicep flexion x10; yellow putty lateral pinch x15, 3 point pinch x 15, composite resisted extension with fingers into putty x 5    Shoulder Instructions       General Comments      Pertinent Vitals/ Pain       Pain Assessment Pain Assessment: No/denies pain  Home Living                                          Prior Functioning/Environment              Frequency  Min 2X/week        Progress Toward Goals  OT Goals(current goals can now be found in the care plan section)  Progress towards OT goals: Progressing toward goals  Acute Rehab OT Goals Patient Stated Goal: get better OT Goal Formulation: With patient Time For Goal Achievement: 10/09/24 Potential to Achieve Goals: Good ADL Goals Pt Will Perform Grooming: with modified independence;standing Pt Will Perform Lower Body Dressing: with modified independence;sit to/from stand Pt Will Transfer to Toilet: with modified independence;ambulating Pt/caregiver will Perform Home Exercise Program: Left upper extremity;Increased strength Additional ADL Goal #1: pt will follow 3 step commands with mod I  Plan      Co-evaluation                 AM-PAC OT 6 Clicks Daily Activity     Outcome Measure   Help from another person eating meals?: None Help from another person taking care of personal grooming?: A Little Help from another person toileting, which includes using toliet, bedpan, or urinal?: A Little Help from another person bathing (including washing, rinsing, drying)?: A Little Help from another person to put on and taking off regular upper body clothing?: A Little Help from another person to put on and taking off regular lower body clothing?: A Little 6 Click Score: 19    End of Session Equipment Utilized During Treatment: Gait belt;Rolling walker (2 wheels)  OT Visit Diagnosis: Unsteadiness on feet (R26.81);Muscle weakness (generalized) (M62.81);Other  symptoms and signs involving cognitive function   Activity Tolerance Patient tolerated treatment well   Patient Left with call bell/phone within reach;in chair;with chair alarm set   Nurse Communication Mobility status        Time: 8551-8467 OT Time Calculation (min): 44 min  Charges: OT General Charges $OT Visit: 1 Visit OT Treatments $Self Care/Home Management : 8-22 mins $Therapeutic Exercise: 8-22 mins  Elma JONETTA Lebron FREDERICK, OTR/L [facility] Acute Rehabilitation Office: [phone]   Elma JONETTA Lebron 09/27/2024, 4:39 PM

## 2024-09-27 NOTE — TOC Initial Note (Signed)
 Transition of Care [facility]) - Initial/Assessment Note    Patient Details  Name: Kelsey Bridges MRN: [id number] Date of Birth: 11-25-1950  Transition of Care [facility]) CM/SW Contact:    Almarie CHRISTELLA Goodie, LCSW Phone Number: 09/27/2024, 2:39 PM  Clinical Narrative:      CSW updated by Rehab Admissions that patient has been denied for CIR. CSW met with patient to discuss other options. Patient initially reporting that she just wanted to go home, but after further discussion patient is in agreement to trying SNF as long as it isn't too far away. CSW discussed CMS choice list and specific SNF options, and patient interested in [place]. Referral completed and sent to [facility], awaiting review. CSW to follow.            UPDATE 4:30 PM: CSW updated by [facility] that they can offer a bed but it would be a semiprivate room. CSW met with patient to discuss. After discussion, patient in agreement to try it. CSW to follow.   Expected Discharge Plan: Skilled Nursing Facility Barriers to Discharge: Insurance Authorization, Continued Medical Work up   Patient Goals and CMS Choice Patient states their goals for this hospitalization and ongoing recovery are:: to get back home CMS Medicare.gov Compare Post Acute Care list provided to:: Patient Choice offered to / list presented to : Patient [place] ownership interest in [facility].provided to:: Patient    Expected Discharge Plan and Services     Post Acute Care Choice: Skilled Nursing Facility Living arrangements for the past 2 months: Apartment                                      Prior Living Arrangements/Services Living arrangements for the past 2 months: Apartment Lives with:: Self Patient language and need for interpreter reviewed:: No Do you feel safe going back to the place where you live?: Yes      Need for Family Participation in Patient Care: No (Comment) Care giver support system in place?: No  (comment) Current home services: DME Criminal Activity/Legal Involvement Pertinent to Current Situation/Hospitalization: No - Comment as needed  Activities of Daily Living   ADL Screening (condition at time of admission) Independently performs ADLs?: Yes (appropriate for developmental age) Is the patient deaf or have difficulty hearing?: No Does the patient have difficulty seeing, even when wearing glasses/contacts?: No Does the patient have difficulty concentrating, remembering, or making decisions?: No  Permission Sought/Granted Permission sought to share information with : Facility Industrial/product Designer granted to share information with : Yes, Verbal Permission Granted     Permission granted to share info w AGENCY: SNF        Emotional Assessment Appearance:: Appears stated age Attitude/Demeanor/Rapport: Engaged Affect (typically observed): Appropriate Orientation: : Oriented to Self, Oriented to Place, Oriented to  Time, Oriented to Situation Alcohol / Substance Use: Not Applicable Psych Involvement: No (comment)  Admission diagnosis:  Hypertensive crisis [I16.9] Weakness [R53.1] Acute ischemic stroke (HCC) [I63.9] Ischemic stroke [facility]) [I63.9] Patient Active Problem List   Diagnosis Date Noted   Acute ischemic stroke (HCC) 09/23/2024   Ischemic stroke (HCC) 09/23/2024   History of colon cancer 09/25/2022   Aortic atherosclerosis 09/25/2022   Genetic testing 08/20/2021   Family history of breast cancer 08/07/2021   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 07/21/2021   Chronic anticoagulation 07/21/2021   Malnutrition of moderate degree 07/18/2021  S/P left hemicolectomy 07/17/2021   Osteoarthritis of right knee 06/15/2021   Mitral regurgitation 06/04/2021   Essential hypertension 05/15/2020   Chronic atrial fibrillation (HCC) 05/15/2020   Hyperlipidemia 05/15/2020   Tobacco abuse 05/15/2020   Pain in right knee 11/22/2019   Carpal  tunnel syndrome on right 12/09/2015   Right hand pain 12/09/2015   LSIL (low grade squamous intraepithelial lesion) on Pap smear 06/08/2011   PCP:  Duwaine Annabella SAILOR, FNP Pharmacy:   [facility] [phone] [address], [place] - [address] AT [address] [place] [address] Phone: [phone] Fax: [phone]  [facility] (Now [facility]) - [address] [place], [place] - [address] [place] [address] Phone: [phone] Fax: [phone]     Social Drivers of Health (SDOH) Social History: SDOH Screenings   Food Insecurity: No Food Insecurity (09/24/2024)  Housing: Unknown (09/24/2024)  Transportation Needs: No Transportation Needs (09/24/2024)  Utilities: Not At Risk (09/24/2024)  Financial Resource Strain: Not on File (01/29/2022)   Received from [facility]  Physical Activity: Not on File (01/29/2022)   Received from [facility]  Social Connections: Moderately Isolated (09/24/2024)  Stress: Not on File (01/29/2022)   Received from [facility]  Tobacco Use: High Risk (12/14/2022)   Received from [facility]   SDOH Interventions:     Readmission Risk Interventions     No data to display

## 2024-09-27 NOTE — Progress Notes (Signed)
 Inpatient Rehab Admissions Coordinator:    CIR following. Peer to peer scheduled with rehab MD for this afternoon. I have not been able to reach Pt.'s family to discuss support. Note that Dr. Babs gave Pt. Mod I goals but I would like to discuss any help Pt. May have at home.   Leita Kleine, MS, CCC-SLP Rehab Admissions Coordinator  [phone] (celll) [phone] (office)

## 2024-09-27 NOTE — Plan of Care (Signed)
  Problem: Education: Goal: Knowledge of disease or condition will improve Outcome: Progressing Goal: Knowledge of secondary prevention will improve (MUST DOCUMENT ALL) Outcome: Progressing Goal: Knowledge of patient specific risk factors will improve (DELETE if not current risk factor) Outcome: Progressing   Problem: Ischemic Stroke/TIA Tissue Perfusion: Goal: Complications of ischemic stroke/TIA will be minimized Outcome: Progressing

## 2024-09-27 NOTE — Progress Notes (Signed)
 Inpatient Rehab Admissions Coordinator:    Case denied with [facility] for CIR. Daughter states she also cannot assist at d/c. Pt. Interested in SNF.  CIR will sign off.   Leita Kleine, MS, CCC-SLP Rehab Admissions Coordinator  [phone] (celll) [phone] (office)

## 2024-09-27 NOTE — NC FL2 (Signed)
 [place]  MEDICAID FL2 LEVEL OF CARE FORM     IDENTIFICATION  Patient Name: Kelsey Bridges Birthdate: 06/22/1951 Sex: female Admission Date (Current Location): 09/22/2024  [facility] and [place] Number:  Producer, Television/film/video and Address:  [facility] [place], [address], [place], [place] [address]      Provider Number: [id number]  Attending Physician Name and Address:  Arlice Reichert, MD  Relative Name and Phone Number:       Current Level of Care: Hospital Recommended Level of Care: Skilled Nursing Facility Prior Approval Number:    Date Approved/Denied:   PASRR Number: [id number]  Discharge Plan: SNF    Current Diagnoses: Patient Active Problem List   Diagnosis Date Noted   Acute ischemic stroke (HCC) 09/23/2024   Ischemic stroke (HCC) 09/23/2024   History of colon cancer 09/25/2022   Aortic atherosclerosis 09/25/2022   Genetic testing 08/20/2021   Family history of breast cancer 08/07/2021   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 07/21/2021   Chronic anticoagulation 07/21/2021   Malnutrition of moderate degree 07/18/2021   S/P left hemicolectomy 07/17/2021   Osteoarthritis of right knee 06/15/2021   Mitral regurgitation 06/04/2021   Essential hypertension 05/15/2020   Chronic atrial fibrillation (HCC) 05/15/2020   Hyperlipidemia 05/15/2020   Tobacco abuse 05/15/2020   Pain in right knee 11/22/2019   Carpal tunnel syndrome on right 12/09/2015   Right hand pain 12/09/2015   LSIL (low grade squamous intraepithelial lesion) on Pap smear 06/08/2011    Orientation RESPIRATION BLADDER Height & Weight     Self, Time, Situation, Place  Normal Continent Weight: 139 lb 15.9 oz (63.5 kg) Height:  5' 1 (154.9 cm)  BEHAVIORAL SYMPTOMS/MOOD NEUROLOGICAL BOWEL NUTRITION STATUS      Continent Diet (heart healthy)  AMBULATORY STATUS COMMUNICATION OF NEEDS Skin   Limited Assist Verbally Normal                        Personal Care Assistance Level of Assistance  Bathing, Feeding, Dressing Bathing Assistance: Limited assistance Feeding assistance: Independent Dressing Assistance: Limited assistance     Functional Limitations Info             SPECIAL CARE FACTORS FREQUENCY  PT (By licensed PT), OT (By licensed OT)     PT Frequency: 5x/wk OT Frequency: 5x/wk            Contractures Contractures Info: Not present    Additional Factors Info  Code Status, Allergies Code Status Info: Full Allergies Info: Penicillins           Current Medications (09/27/2024):  This is the current hospital active medication list Current Facility-Administered Medications  Medication Dose Route Frequency Provider Last Rate Last Admin   acetaminophen  (TYLENOL ) tablet 650 mg  650 mg Oral Q4H PRN Alghanim, Fahid, MD       Or   acetaminophen  (TYLENOL ) 160 MG/5ML solution 650 mg  650 mg Per Tube Q4H PRN Alghanim, Fahid, MD       Or   acetaminophen  (TYLENOL ) suppository 650 mg  650 mg Rectal Q4H PRN Alghanim, Fahid, MD       apixaban  (ELIQUIS ) tablet 5 mg  5 mg Oral BID Remi Pippin, NP   5 mg at 09/27/24 0920   atorvastatin  (LIPITOR) tablet 40 mg  40 mg Oral Daily Alghanim, Fahid, MD   40 mg at 09/27/24 9078   docusate sodium  (COLACE) capsule 100 mg  100 mg  Oral BID PRN Stretch, Robert J, MD       hydrALAZINE  (APRESOLINE ) injection 10 mg  10 mg Intravenous Q6H PRN Toberman, Stevi W, NP   10 mg at 09/25/24 0341   labetalol  (NORMODYNE ) injection 10 mg  10 mg Intravenous Q2H PRN Toberman, Stevi W, NP       losartan  (COZAAR ) tablet 100 mg  100 mg Oral q morning Remi Pippin, NP   100 mg at 09/27/24 9078   Oral care mouth rinse  15 mL Mouth Rinse PRN Arora, Ashish, MD       polyethylene glycol (MIRALAX  / GLYCOLAX ) packet 17 g  17 g Oral Daily PRN Stretch, Robert J, MD       sertraline  (ZOLOFT ) tablet 100 mg  100 mg Oral Daily PRN Alva, Rakesh V, MD   100 mg at 09/25/24 2114     Discharge  Medications: Please see discharge summary for a list of discharge medications.  Relevant Imaging Results:  Relevant Lab Results:   Additional Information SS#: [id number]  Almarie CHRISTELLA Goodie, LCSW

## 2024-09-27 NOTE — Progress Notes (Signed)
 PROGRESS NOTE  MERRIL ISAKSON  DOB: 1951/02/18  PCP: Duwaine Annabella SAILOR, FNP FMW:[id number]  DOA: 09/22/2024  LOS: 4 days  Hospital Day: 6  Subjective: Patient was seen and examined this morning. Sitting up in recliner.  Not in distress no new symptoms. Afebrile, hemodynamically stable, breathing on room air Pending placement  Brief narrative: Kelsey Bridges is a 73 y.o. female with PMH significant for HTN, A-fib on Eliquis , current smoker, h/o colorectal cancer stage III b s/p left collectomy 07/17/2021 and chemoRx  Reports compliance to medications including Eliquis .  12/12, patient presented to the ED with complaint of acute onset left lower extremity weakness and dysarthria that was started 4 hours prior to presentation.  She had a sudden weakness in the left leg, was unable to stand.  Also experienced slurred speech, a feeling of 'heavy and obstructed tongue'.  Tried to drink water and eat drooled on the side of her face.   Initial blood pressure in the ED was 229/202. She was started on Cardene  drip CT head was unremarkable CTA head and neck showed moderate stenosis of the right PCA distal P1 segment. MRI brain showed acute infarct involving the posterior right coronary D10 basal ganglia.  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  Imagings as above Stroke workup completed Echo with EF 65 to 70%, normal left atrium, no evidence of interatrial shunt. LDL 102, A1c 5.3 Neurology recommended to continue Eliquis  as before Seen by PT/OT.  CIR recommended  A-fib Patient states she was not taking her Cardizem . Currently not on any AV nodal blocking agent. Continue anticoagulation with Eliquis  as before  Hypertensive  Emergency Initial blood pressure in the ED was over 220 systolic.   Was started on nicardipine  drip and admitted to ICU.  Eventually blood pressure improved. Currently blood pressure stable on losartan  100 mg daily  HLD Lipitor 40 mg daily  Abnormal urinalysis Urinalysis on admission showed clear straw-colored urine with trace leukocytes, positive nitrate, rare bacteria.  It seems urine culture was not sent.  She was not started on antibiotics Did not complete any symptoms today to me   Anxiety  Continue Zoloft  100 mg daily PRN  Nutrition Status:         Mobility:   PT Orders: Active   PT Follow up Rec: Acute Inpatient Rehab (3hours/Day)09/26/2024 1200    Goals of care   Code Status: Full Code     DVT prophylaxis:  SCD's Start: 09/23/24 0749 apixaban  (ELIQUIS ) tablet 5 mg   Antimicrobials: None Fluid: None Consultants: Neurology Family Communication: None at bedside  Status: Inpatient Level of care:  Telemetry   Patient is from: Home Needs to continue in-hospital care: Medically stable for discharge Anticipated d/c to: Pending CIR      Diet:  Diet Order             Diet Heart Room service appropriate? Yes; Fluid consistency: Thin  Diet effective now                   Scheduled Meds:  apixaban   5 mg Oral BID   atorvastatin   40 mg Oral Daily   losartan   100 mg Oral q morning    PRN meds: acetaminophen  **OR** acetaminophen  (TYLENOL ) oral liquid 160 mg/5 mL **OR**  acetaminophen , docusate sodium , hydrALAZINE , labetalol , mouth rinse, polyethylene glycol, sertraline    Infusions:    Antimicrobials: Anti-infectives (From admission, onward)    None       Objective: Vitals:   09/27/24 1136 09/27/24 1528  BP: 103/67 111/72  Pulse: 76 65  Resp: 16 15  Temp: 98.1 F (36.7 C) 97.9 F (36.6 C)  SpO2: 99% 100%    Intake/Output Summary (Last 24 hours) at 09/27/2024 1646 Last data filed at 09/27/2024 0900 Gross per 24 hour  Intake 720  ml  Output --  Net 720 ml   Filed Weights   09/23/24 0853 09/25/24 0500 09/26/24 0500  Weight: 77.1 kg 63.6 kg 63.5 kg   Weight change:  Body mass index is 26.45 kg/m.   Physical Exam: General exam: Pleasant, elderly African-American female.  Not in distress Skin: No rashes, lesions or ulcers. HEENT: Atraumatic, normocephalic, no obvious bleeding Lungs: Clear to auscultation bilaterally,  CVS: S1, S2, no murmur,   GI/Abd: Soft, nontender, nondistended, bowel sound present,   CNS: Alert, awake, oriented x 3.  Slow to respond Psychiatry: Mood appropriate Extremities: No pedal edema, no calf tenderness,   Data Review: I have personally reviewed the laboratory data and studies available.  F/u labs ordered Unresulted Labs (From admission, onward)    None       Signed, Chapman Rota, MD [facility] 09/27/2024

## 2024-09-28 DIAGNOSIS — I639 Cerebral infarction, unspecified: Secondary | ICD-10-CM | POA: Diagnosis not present

## 2024-09-28 MED ORDER — ATORVASTATIN CALCIUM 40 MG PO TABS: 40.0000 mg | ORAL_TABLET | Freq: Every day | ORAL | Status: AC

## 2024-09-28 MED ORDER — ACETAMINOPHEN 325 MG PO TABS: 650.0000 mg | ORAL_TABLET | ORAL | Status: AC | PRN

## 2024-09-28 MED ORDER — POLYETHYLENE GLYCOL 3350 17 G PO PACK: 17.0000 g | PACK | Freq: Every day | ORAL | Status: AC | PRN

## 2024-09-28 MED ORDER — DOCUSATE SODIUM 100 MG PO CAPS: 100.0000 mg | ORAL_CAPSULE | Freq: Two times a day (BID) | ORAL | Status: AC | PRN

## 2024-09-28 NOTE — Plan of Care (Signed)

## 2024-09-28 NOTE — Progress Notes (Signed)
 PROGRESS NOTE  [name]  DOB: [birth year]  PCP: Duwaine Annabella SAILOR, FNP FMW:[id number]  DOA: 09/22/2024  LOS: 5 days  Hospital Day: 7  Subjective: Patient was seen and examined this morning.  Sitting up in recliner.  Not in distress.  No new symptoms. Afebrile, hemodynamically stable Pending placement  Brief narrative: Kelsey Bridges is a 73 y.o. female with PMH significant for HTN, A-fib on Eliquis , current smoker, h/o colorectal cancer stage III b s/p left collectomy 07/17/2021 and chemoRx  Reports compliance to medications including Eliquis .  12/12, patient presented to the ED with complaint of acute onset left lower extremity weakness and dysarthria that was started 4 hours prior to presentation.  She had a sudden weakness in the left leg, was unable to stand.  Also experienced slurred speech, a feeling of 'heavy and obstructed tongue'.  Tried to drink water and eat drooled on the side of her face.   Initial blood pressure in the ED was 229/202. She was started on Cardene  drip CT head was unremarkable CTA head and neck showed moderate stenosis of the right PCA distal P1 segment. MRI brain showed acute infarct involving the posterior right coronary D10 basal ganglia.  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  Imagings as above Stroke workup completed Echo with EF 65 to 70%, normal left atrium, no evidence of interatrial shunt. LDL 102, A1c 5.3 Neurology recommended to continue Eliquis  as before Seen by PT/OT.  CIR recommended  A-fib Patient states she was not taking her Cardizem . Currently not on any AV nodal blocking agent. Continue anticoagulation with Eliquis  as before  Hypertensive Emergency Initial blood pressure  in the ED was over 220 systolic.   Was started on nicardipine  drip and admitted to ICU.  Eventually blood pressure improved. Currently blood pressure stable on losartan  100 mg daily  HLD Lipitor 40 mg daily  Abnormal urinalysis Urinalysis on admission showed clear straw-colored urine with trace leukocytes, positive nitrate, rare bacteria.  It seems urine culture was not sent.  She was not started on antibiotics Did not complete any symptoms today to me   Anxiety  Continue Zoloft  100 mg daily PRN  Nutrition Status:         Mobility:   PT Orders: Active   PT Follow up Rec: Acute Inpatient Rehab (3hours/Day)09/26/2024 1200    Goals of care   Code Status: Full Code     DVT prophylaxis:  SCD's Start: 09/23/24 0749 apixaban  (ELIQUIS ) tablet 5 mg   Antimicrobials: None Fluid: None Consultants: Neurology Family Communication: None at bedside  Status: Inpatient Level of care:  Telemetry   Patient is from: Home Needs to continue in-hospital care: Medically stable for discharge Anticipated d/c to: Pending CIR      Diet:  Diet Order             Diet Heart Room service appropriate? Yes; Fluid consistency: Thin  Diet effective now                   Scheduled Meds:  apixaban   5 mg Oral BID   atorvastatin   40 mg Oral Daily   losartan   100 mg Oral q morning    PRN meds: acetaminophen  **OR** acetaminophen  (TYLENOL ) oral liquid 160 mg/5 mL **OR** acetaminophen , docusate  sodium, hydrALAZINE , labetalol , mouth rinse, polyethylene glycol, sertraline    Infusions:    Antimicrobials: Anti-infectives (From admission, onward)    None       Objective: Vitals:   09/28/24 0431 09/28/24 0759  BP:  112/66  Pulse: 64 64  Resp:  16  Temp: 98.3 F (36.8 C) 98.3 F (36.8 C)  SpO2: 100% 100%   No intake or output data in the 24 hours ending 09/28/24 1130  Filed Weights   09/23/24 0853 09/25/24 0500 09/26/24 0500  Weight: 77.1 kg 63.6 kg 63.5 kg    Weight change:  Body mass index is 26.45 kg/m.   Physical Exam: General exam: Pleasant, elderly African-American female.  Not in distress Skin: No rashes, lesions or ulcers. HEENT: Atraumatic, normocephalic, no obvious bleeding Lungs: Clear to auscultation bilaterally,  CVS: S1, S2, no murmur,   GI/Abd: Soft, nontender, nondistended, bowel sound present,   CNS: Alert, awake, oriented x 3.  Slow to respond Psychiatry: Mood appropriate Extremities: No pedal edema, no calf tenderness,   Data Review: I have personally reviewed the laboratory data and studies available.  F/u labs ordered Unresulted Labs (From admission, onward)    None       Signed, Chapman Rota, MD [facility] 09/28/2024

## 2024-09-28 NOTE — Plan of Care (Signed)
 Able to do perform self care activities. Sat in chair during the day time.  Problem: Self-Care: Goal: Ability to communicate needs accurately will improve Outcome: Progressing   Problem: Self-Care: Goal: Verbalization of feelings and concerns over difficulty with self-care will improve Outcome: Progressing   Problem: Self-Care: Goal: Ability to participate in self-care as condition permits will improve Outcome: Progressing   Problem: Education: Goal: Knowledge of secondary prevention will improve (MUST DOCUMENT ALL) Outcome: Progressing   Problem: Education: Goal: Knowledge of patient specific risk factors will improve (DELETE if not current risk factor) Outcome: Progressing   Problem: Self-Care: Goal: Ability to participate in self-care as condition permits will improve Outcome: Progressing   Problem: Education: Goal: Ability to describe self-care measures that may prevent or decrease complications (Diabetes Survival Skills Education) will improve Outcome: Progressing

## 2024-09-28 NOTE — Patient Instructions (Signed)
 1) Strengthening: Chest Pull - Resisted   Hold Theraband in front of body with hands about shoulder width a part. Pull band a part and back together slowly. Repeat __10__ times. Complete ___1_ set(s) per session.. Repeat ___1_ session(s) per day.  http://orth.exer.us /926   Copyright  VHI. All rights reserved.   2) PNF Strengthening: Resisted   Standing with resistive band around each hand, bring right arm up and away, thumb back. Repeat _10___ times per set. Do __1__ sets per session. Do __1__ sessions per day.    3) Resisted External Rotation: in Neutral - Bilateral   Sit or stand, tubing in both hands, elbows at sides, bent to 90, forearms forward. Pinch shoulder blades together and rotate forearms out. Keep elbows at sides. Repeat _10___ times per set. Do __1__ sets per session. Do _1___ sessions per day.  http://orth.exer.us /966   Copyright  VHI. All rights reserved.   4) PNF Strengthening: Resisted   Standing, hold resistive band above head. Bring right arm down and out from side. Repeat _10___ times per set. Do __1__ sets per session. Do __1__ sessions per day.  http://orth.exer.us /922   Copyright  VHI. All rights reserved.    ELASTIC BAND TRICEPS EXTENSION - SELF FIXATION  While seated, hold and fixate one end of an elastic band against your chest. Hold the other end with your opposite hand with your elbow bent and arm by your side.   Start by pulling the band downward so that the elbow goes from a bent position to a straightened position as shown. Return to starting position and repeat.  Complete 10 repetitions, 1 set.     Theraband Elbow Flexion  Loop the middle of the band around one foot  With arms straight by side and palms facing forward, hold onto each end of the band Bend arms bringing hands toward shoulders, keeping elbows by your side Return to starting position . Complete 10 repetitions, 1 set.

## 2024-09-28 NOTE — Progress Notes (Signed)
 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 Pt required physical assist to power up into standing from recliner and toilet d/t seat surface. Once standing, pt required SBA for functional mobility. Completed BUE strengthening utilizing red band provided at previous OT session. Provided pt with HEP handout and completed during session. OT provided verbal instruction and visual demonstration for proper form and technique. Pt demonstrated understanding. Patient will benefit from continued inpatient follow up therapy, <3 hours/day.       If plan is discharge home, recommend the following:  A little help with walking and/or transfers;A little help with bathing/dressing/bathroom;Assistance with cooking/housework;Assist for transportation;Help with stairs or ramp for entrance   Equipment Recommendations  Other (comment) (defer to next level of care)       Precautions / Restrictions Precautions Precautions: Fall Recall of Precautions/Restrictions: Intact Precaution/Restrictions Comments: L sided weakness Restrictions Weight Bearing Restrictions Per Provider Order: No       Mobility Bed Mobility  General bed mobility comments: up in recliner    Transfers Overall transfer level: Needs assistance Equipment used: Rolling walker (2 wheels) Transfers: Sit to/from Stand, Bed to chair/wheelchair/BSC Sit to Stand: Min assist     Step pivot  transfers: Supervision     General transfer comment: VC for hand placement with RW management prior to sit to stand. Required physical assist to power up into standing from recliner d/t low seat.     Balance Overall balance assessment: Needs assistance Sitting-balance support: Feet supported Sitting balance-Leahy Scale: Good     Standing balance support: Bilateral upper extremity supported, During functional activity, Reliant on assistive device for balance Standing balance-Leahy Scale: Poor Standing balance comment: Able to wash hands at sink without holding on RW although body was up against counter d/t height.        ADL either performed or assessed with clinical judgement   ADL       Grooming: Standing;Supervision/safety;Wash/dry Consulting Civil Engineer: Minimal assistance;Rolling walker (2 wheels);Regular Toilet;Grab bars Toilet Transfer Details (indicate cue type and reason): required assist to power up from toilet d/t low sitting surface Toileting- Clothing Manipulation and Hygiene: Supervision/safety;Sit to/from stand                 Communication Communication Communication: No apparent difficulties   Cognition Arousal: Alert Behavior During Therapy: WFL for tasks assessed/performed       Awareness: Intellectual awareness intact, Online awareness intact   Attention impairment (select first level of impairment): Divided attention   OT - Cognition Comments: Improved cognition demonstrated during session. Pt called bank and [employer] to pay bill with OT providing assistance for using hospital phone.    Following commands: Intact Following commands impaired: Only follows one step commands consistently      Cueing   Cueing Techniques: Verbal cues  Exercises General Exercises - Upper Extremity Shoulder ADduction: Strengthening, Both, 10 reps, Seated, Theraband Theraband Level (Shoulder Adduction): Level 2 (Red) Shoulder Horizontal ABduction: Strengthening,  Both, 10  reps, Seated, Theraband Theraband Level (Shoulder Horizontal Abduction): Level 2 (Red) Elbow Flexion: Strengthening, Both, 10 reps, Seated, Theraband Theraband Level (Elbow Flexion): Level 2 (Red) Elbow Extension: Strengthening, Both, 10 reps, Seated, Theraband Theraband Level (Elbow Extension): Level 2 (Red) Other Exercises Other Exercises: seated, BUE, PNF pattern D2, 1x10, red band       General Comments VSS on RA    Pertinent Vitals/ Pain       Pain Assessment Pain Assessment: No/denies pain         Frequency  Min 2X/week        Progress Toward Goals  OT Goals(current goals can now be found in the care plan section)  Progress towards OT goals: Progressing toward goals            AM-PAC OT 6 Clicks Daily Activity     Outcome Measure   Help from another person eating meals?: None Help from another person taking care of personal grooming?: None Help from another person toileting, which includes using toliet, bedpan, or urinal?: A Little Help from another person bathing (including washing, rinsing, drying)?: A Little Help from another person to put on and taking off regular upper body clothing?: A Little Help from another person to put on and taking off regular lower body clothing?: A Little 6 Click Score: 20    End of Session Equipment Utilized During Treatment: Rolling walker (2 wheels)  OT Visit Diagnosis: Unsteadiness on feet (R26.81);Muscle weakness (generalized) (M62.81);Other symptoms and signs involving cognitive function   Activity Tolerance Patient tolerated treatment well   Patient Left with call bell/phone within reach;in chair;with chair alarm set           Time: 1325-1400 OT Time Calculation (min): 35 min  Charges: OT General Charges $OT Visit: 1 Visit OT Treatments $Self Care/Home Management : 8-22 mins $Therapeutic Exercise: 8-22 mins  Leita Howell, OTR/L,CBIS  Supplemental OT - [facility] and [facility] Secure Chat Preferred     Zollie Clemence, Leita BIRCH 09/28/2024, 2:38 PM

## 2024-09-28 NOTE — Progress Notes (Signed)
 Physical Therapy Treatment Patient Details Name: Kelsey Bridges MRN: [id number] DOB: Sep 29, 1951 Today's Date: 09/28/2024   History of Present Illness 73 y.o. 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 NIH 3. PMHx: essential HTN, afib on eliquis , nicotine dependence colorectal CA s/p L colectomy 2022, OA.    PT Comments  Pt greeted seated in recliner chair, pleasant and agreeable to PT session. Pt engaged in BLE exercises including seated marches, LAQs with 5 second hold, ankle pumps, and heel rises. She performed sit<>stand using RW with minA x12 reps. Decreased cues for hand placement and absence of posterior lean with repetition. Pt ambulated within the hallway using a step-through gait pattern. She required minA for safety while mobilizing using RW. Educated pt on BEFAST and discussed signs/symptoms as well as risk factors for stroke. Patient will benefit from continued inpatient follow up therapy, <3 hours/day.     If plan is discharge home, recommend the following: A little help with bathing/dressing/bathroom;Assistance with cooking/housework;Supervision due to cognitive status;Assist for transportation;A little help with walking and/or transfers   Can travel by private vehicle     No  Equipment Recommendations  Rolling walker (2 wheels)    Recommendations for Other Services       Precautions / Restrictions Precautions Precautions: Fall Recall of Precautions/Restrictions: Intact Restrictions Weight Bearing Restrictions Per Provider Order: No     Mobility  Bed Mobility               General bed mobility comments: Not assessed. Pt greeted seated in recliner chair and returned there at end of session.    Transfers Overall transfer level: Needs assistance Equipment used: Rolling walker (2 wheels) Transfers: Sit to/from Stand Sit to Stand: Min  assist           General transfer comment: Pt stood from recliner chair. Cued proper hand placement. Pt was slightly unsteady with posterior lean initally, but improved. Pt completed sit<>stand x12 reps. Good eccentric control.    Ambulation/Gait Ambulation/Gait assistance: Min assist Gait Distance (Feet): 100 Feet Assistive device: Rolling walker (2 wheels) Gait Pattern/deviations: Step-through pattern, Decreased stride length, Decreased stance time - left, Drifts right/left, Narrow base of support Gait velocity: dec Gait velocity interpretation: <1.31 ft/sec, indicative of household ambulator   General Gait Details: Pt ambulated with a slow smooth steps. Cued heel-to-toe gait pattern. Pt achieved good foot clearence and even weight shift. Cues for proximity to RW. No knee buckling observed. Pt took increased time and required steadying support during turns.   Stairs             Wheelchair Mobility     Tilt Bed    Modified Rankin (Stroke Patients Only) Modified Rankin (Stroke Patients Only) Pre-Morbid Rankin Score: No significant disability Modified Rankin: Moderately severe disability     Balance Overall balance assessment: Needs assistance Sitting-balance support: Feet supported Sitting balance-Leahy Scale: Good     Standing balance support: Bilateral upper extremity supported, During functional activity, Reliant on assistive device for balance Standing balance-Leahy Scale: Poor Standing balance comment: Pt dependent on RW                            Communication Communication Communication: No apparent difficulties  Cognition Arousal: Alert Behavior During Therapy: WFL for tasks assessed/performed   PT - Cognitive impairments: No family/caregiver present to  determine baseline                       PT - Cognition Comments: Pt A,Ox4. She had difficulty dual-tasking, becoming distracted. Redirected pt to the task and provided moderate  cues. She appears to have delayed processing and poor safety awareness. Following commands: Intact Following commands impaired: Only follows one step commands consistently    Cueing Cueing Techniques: Verbal cues  Exercises General Exercises - Lower Extremity Ankle Circles/Pumps: Seated, Both, AROM, 10 reps Long Arc Quad: Seated, Both, AROM, 10 reps (with 5 second hold at top) Hip Flexion/Marching: Seated, Both, AROM, 10 reps Heel Raises: Seated, Both, AROM, 10 reps    General Comments General comments (skin integrity, edema, etc.): VSS on RA. Educated pt on sign/symptoms of stroke through BEFAST. Discussed risk factors. Encouraged exercise and a proper diet.      Pertinent Vitals/Pain Pain Assessment Pain Assessment: No/denies pain    Home Living                          Prior Function            PT Goals (current goals can now be found in the care plan section) Acute Rehab PT Goals Patient Stated Goal: Get better and go home Progress towards PT goals: Progressing toward goals    Frequency    Min 3X/week      PT Plan      Co-evaluation              AM-PAC PT 6 Clicks Mobility   Outcome Measure  Help needed turning from your back to your side while in a flat bed without using bedrails?: A Little Help needed moving from lying on your back to sitting on the side of a flat bed without using bedrails?: A Little Help needed moving to and from a bed to a chair (including a wheelchair)?: A Little Help needed standing up from a chair using your arms (e.g., wheelchair or bedside chair)?: A Little Help needed to walk in hospital room?: A Little Help needed climbing 3-5 steps with a railing? : A Lot 6 Click Score: 17    End of Session Equipment Utilized During Treatment: Gait belt Activity Tolerance: Patient tolerated treatment well Patient left: in chair;with call bell/phone within reach;with chair alarm set Nurse Communication: Mobility status PT  Visit Diagnosis: Unsteadiness on feet (R26.81);Other symptoms and signs involving the nervous system (R29.898);Hemiplegia and hemiparesis Hemiplegia - Right/Left: Left Hemiplegia - dominant/non-dominant: Non-dominant Hemiplegia - caused by: Cerebral infarction     Time: 1530-1555 PT Time Calculation (min) (ACUTE ONLY): 25 min  Charges:    $Gait Training: 8-22 mins $Therapeutic Exercise: 8-22 mins PT General Charges $$ ACUTE PT VISIT: 1 Visit                     Randall SAUNDERS, PT, DPT Acute Rehabilitation Services Office: [phone] Secure Chat Preferred  Delon CHRISTELLA Callander 09/28/2024, 4:01 PM

## 2024-09-28 NOTE — TOC Progression Note (Signed)
 Transition of Care [facility]) - Progression Note    Patient Details  Name: Kelsey Bridges MRN: [id number] Date of Birth: 01/07/1951  [facility]) CM/SW Contact  Almarie CHRISTELLA Goodie, [place] Phone Number: 09/28/2024, 3:58 PM  Clinical Narrative:   CSW contacted CMA this morning to request initiation of insurance authorization. Authorization remains pending at this time. CSW to follow.    Expected Discharge Plan: Skilled Nursing Facility Barriers to Discharge: English As A Second Language Teacher, Continued Medical Work up               Expected Discharge Plan and Services     Post Acute Care Choice: Skilled Nursing Facility Living arrangements for the past 2 months: Apartment                                       Social Drivers of Health (SDOH) Interventions SDOH Screenings   Food Insecurity: No Food Insecurity (09/24/2024)  Housing: Unknown (09/24/2024)  Transportation Needs: No Transportation Needs (09/24/2024)  Utilities: Not At Risk (09/24/2024)  Financial Resource Strain: Not on File (01/29/2022)   Received from [facility]  Physical Activity: Not on File (01/29/2022)   Received from [facility]  Social Connections: Moderately Isolated (09/24/2024)  Stress: Not on File (01/29/2022)   Received from [facility]  Tobacco Use: High Risk (12/14/2022)   Received from [facility]    Readmission Risk Interventions     No data to display

## 2024-09-29 NOTE — TOC Transition Note (Signed)
 Transition of Care [facility]) - Discharge Note   Patient Details  Name: Kelsey Bridges MRN: [id number] Date of Birth: 04-17-1951  [facility]) CM/SW Contact:  Almarie CHRISTELLA Goodie, LCSW Phone Number: 09/29/2024, 12:05 PM   Clinical Narrative:   Patient received insurance approval and [facility] has a bed to admit patient today. CSW met with patient, she is in agreement. CSW sent discharge information to [facility], confirmed receipt and room is ready. Transport arranged with [facility] for next available.  Nurse to call report to [phone], Room 204B.    Final next level of care: Skilled Nursing Facility Barriers to Discharge: Barriers Resolved   Patient Goals and CMS Choice Patient states their goals for this hospitalization and ongoing recovery are:: to get back home CMS Medicare.gov Compare Post Acute Care list provided to:: Patient Choice offered to / list presented to : Patient [place] ownership interest in [facility].provided to:: Patient    Discharge Placement              Patient chooses bed at: [facility] Patient to be transferred to facility by: [facility] Name of family member notified: Self Patient and family notified of of transfer: 09/29/24  Discharge Plan and Services Additional resources added to the After Visit Summary for       Post Acute Care Choice: Skilled Nursing Facility                               Social Drivers of Health (SDOH) Interventions SDOH Screenings   Food Insecurity: No Food Insecurity (09/24/2024)  Housing: Unknown (09/24/2024)  Transportation Needs: No Transportation Needs (09/24/2024)  Utilities: Not At Risk (09/24/2024)  Financial Resource Strain: Not on File (01/29/2022)   Received from [facility]  Physical Activity: Not on File (01/29/2022)   Received from [facility]  Social Connections: Moderately Isolated (09/24/2024)  Stress: Not on File (01/29/2022)   Received from [facility]  Tobacco Use: High  Risk (12/14/2022)   Received from [facility]     Readmission Risk Interventions     No data to display

## 2024-09-29 NOTE — Discharge Summary (Signed)
 "  Physician Discharge Summary  Kelsey Bridges FMW:[id number] DOB: May 30, 1951 DOA: 09/22/2024  PCP: Duwaine Annabella SAILOR, FNP  Admit date: 09/22/2024 Discharge date: 09/29/2024  Admitted from: Home Discharge disposition: SNF  Subjective: Patient was seen and examined this morning.  Sitting up in recliner.  Not in distress.  No new symptoms. Afebrile, hemodynamically stable Pending placement  Brief narrative: Kelsey Bridges is a 73 y.o. female with PMH significant for HTN, A-fib on Eliquis , current smoker, h/o colorectal cancer stage III b s/p left collectomy 07/17/2021 and chemoRx  Reports compliance to medications including Eliquis .  12/12, patient presented to the ED with complaint of acute onset left lower extremity weakness and dysarthria that was started 4 hours prior to presentation.  She had a sudden weakness in the left leg, was unable to stand.  Also experienced slurred speech, a feeling of 'heavy and obstructed tongue'.  Tried to drink water and eat drooled on the side of her face.   Initial blood pressure in the ED was 229/202. She was started on Cardene  drip CT head was unremarkable CTA head and neck showed moderate stenosis of the right PCA distal P1 segment. MRI brain showed acute infarct involving the posterior right coronary D10 basal ganglia.  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  Imagings as above Stroke workup completed Echo with EF 65 to 70%, normal left atrium, no evidence of interatrial shunt. LDL 102, A1c 5.3 Neurology recommended to continue Eliquis  as before Seen by PT/OT.  SNF recommended. Has residual left lower extremity weakness  A-fib Patient states she was not taking her Cardizem . Currently not  requiring any AV nodal blocking agent. Continue anticoagulation with Eliquis  as before  Hypertensive Emergency Initial blood pressure in the ED was over 220 systolic.   Was started on nicardipine  drip and admitted to ICU.  Eventually blood pressure improved. Currently blood pressure stable on losartan  100 mg daily  HLD Lipitor 40 mg daily  Abnormal urinalysis Urinalysis on admission showed clear straw-colored urine with trace leukocytes, positive nitrate, rare bacteria.  It seems urine culture was not sent.  She was not started on antibiotics Did not have any symptoms.   Anxiety  Continue Zoloft  100 mg daily PRN  Goals of care   Code Status: Full Code   Diet:  Diet Order             Diet general           Diet Heart Room service appropriate? Yes; Fluid consistency: Thin  Diet effective now                   Nutritional status:  Body mass index is 26.45 kg/m.       Wounds:  -    Discharge Medications:   Allergies as of 09/29/2024       Reactions   Penicillins Rash   Reaction to injection / does not react to tablets        Medication List     STOP taking these medications    diltiazem  240 MG 24 hr capsule Commonly known as: CARDIZEM  CD   meloxicam 15 MG tablet Commonly known as: MOBIC   Myrbetriq 25 MG Tb24 tablet Generic drug: mirabegron ER   Myrbetriq 50 MG Tb24 tablet Generic drug:  mirabegron ER   nitrofurantoin (macrocrystal-monohydrate) 100 MG capsule Commonly known as: MACROBID   potassium chloride  SA 20 MEQ tablet Commonly known as: KLOR-CON  M   prochlorperazine  10 MG tablet Commonly known as: COMPAZINE        TAKE these medications    acetaminophen  325 MG tablet Commonly known as: TYLENOL  Take 2 tablets (650 mg total) by mouth every 4 (four) hours as needed for mild pain (pain score 1-3) or fever (or temp > 37.5 C (99.5 F)).   atorvastatin  40 MG tablet Commonly known as: LIPITOR Take 1 tablet (40 mg total) by mouth  daily.   augmented betamethasone dipropionate 0.05 % ointment Commonly known as: DIPROLENE-AF Apply topically daily.   docusate sodium  100 MG capsule Commonly known as: COLACE Take 1 capsule (100 mg total) by mouth 2 (two) times daily as needed for mild constipation.   Eliquis  5 MG Tabs tablet Generic drug: apixaban  TAKE 1 TABLET TWICE DAILY   ferrous sulfate  325 (65 FE) MG tablet Take 325 mg by mouth every morning.   losartan  100 MG tablet Commonly known as: COZAAR  Take 100 mg by mouth every morning.   polyethylene glycol 17 g packet Commonly known as: MIRALAX  / GLYCOLAX  Take 17 g by mouth daily as needed for moderate constipation.   sertraline  100 MG tablet Commonly known as: ZOLOFT  Take 100 mg by mouth daily as needed (anxiety).         Follow ups:    Contact information for follow-up providers     Duwaine Annabella SAILOR, FNP Follow up.   Specialty: Family Medicine Contact information: [address] [place] [address] [phone]              Contact information for after-discharge care     Destination     [facility], [place] .   Service: Skilled Nursing Contact information: [address] [place]  [address] [phone]                     Discharge Instructions:   Discharge Instructions     Ambulatory referral to Neurology   Complete by: As directed    Stroke follow-up   Call MD for:  difficulty breathing, headache or visual disturbances   Complete by: As directed    Call MD for:  extreme fatigue   Complete by: As directed    Call MD for:  hives   Complete by: As directed    Call MD for:  persistant dizziness or light-headedness   Complete by: As directed    Call MD for:  persistant nausea and vomiting   Complete by: As directed    Call MD for:  severe uncontrolled pain   Complete by: As directed    Call MD for:  temperature >100.4   Complete by: As directed    Diet general    Complete by: As directed    Discharge instructions   Complete by: As directed    General discharge instructions: Follow with Primary MD Duwaine Annabella SAILOR, FNP in 7 days  Please request your PCP  to go over your hospital tests, procedures, radiology results at the follow up. Please get your medicines reviewed and adjusted.  Your PCP may decide to repeat certain labs or tests as needed. Do not drive, operate heavy machinery, perform activities at heights, swimming or participation in water activities or provide baby sitting services if your were admitted for syncope or siezures until you have seen  by Primary MD or a Neurologist and advised to do so again. [place]  Controlled Substance Reporting System database was reviewed. Do not drive, operate heavy machinery, perform activities at heights, swim, participate in water activities or provide baby-sitting services while on medications for pain, sleep and mood until your outpatient physician has reevaluated you and advised to do so again.  You are strongly recommended to comply with the dose, frequency and duration of prescribed medications. Activity: As tolerated with Full fall precautions use walker/cane & assistance as needed Avoid using any recreational substances like cigarette, tobacco, alcohol, or non-prescribed drug. If you experience worsening of your admission symptoms, develop shortness of breath, life threatening emergency, suicidal or homicidal thoughts you must seek medical attention immediately by calling 911 or calling your MD immediately  if symptoms less severe. You must read complete instructions/literature along with all the possible adverse reactions/side effects for all the medicines you take and that have been prescribed to you. Take any new medicine only after you have completely understood and accepted all the possible adverse reactions/side effects.  Wear Seat belts while driving. You were cared for by a hospitalist during  your hospital stay. If you have any questions about your discharge medications or the care you received while you were in the hospital after you are discharged, you can call the unit and ask to speak with the hospitalist or the covering physician. Once you are discharged, your primary care physician will handle any further medical issues. Please note that NO REFILLS for any discharge medications will be authorized once you are discharged, as it is imperative that you return to your primary care physician (or establish a relationship with a primary care physician if you do not have one).   Increase activity slowly   Complete by: As directed        Discharge Exam:   Vitals:   09/28/24 2015 09/28/24 2326 09/29/24 0343 09/29/24 0748  BP: 139/81 (!) 166/90 (!) 146/89 (!) 132/93  Pulse: 63 71 66 64  Resp: 18 18 17 18   Temp: 98.4 F (36.9 C) 98.3 F (36.8 C) 98.1 F (36.7 C) 98.2 F (36.8 C)  TempSrc: Oral Oral Oral Oral  SpO2: 99% 95% 100% 94%  Weight:      Height:        Body mass index is 26.45 kg/m.   General exam: Pleasant, elderly African-American female.  Not in distress Skin: No rashes, lesions or ulcers. HEENT: Atraumatic, normocephalic, no obvious bleeding Lungs: Clear to auscultation bilaterally,  CVS: S1, S2, no murmur,   GI/Abd: Soft, nontender, nondistended, bowel sound present,   CNS: Alert, awake, oriented x 3.Has residual left lower extremity weakness Psychiatry: Mood appropriate Extremities: No pedal edema, no calf tenderness,    The results of significant diagnostics from this hospitalization (including imaging, microbiology, ancillary and laboratory) are listed below for reference.    Procedures and Diagnostic Studies:   MR BRAIN WO CONTRAST Result Date: 09/23/2024 EXAM: MRI BRAIN WITHOUT CONTRAST 09/23/2024 11:57:00 AM TECHNIQUE: Multiplanar multisequence MRI of the head/brain was performed without the administration of intravenous contrast. COMPARISON: CT  head 09/22/2024 and CTA head and neck 09/23/2024. CLINICAL HISTORY: Neurological deficit, acute, stroke suspected. Onset of left lower extremity weakness and dysarthria. FINDINGS: BRAIN AND VENTRICLES: There is a 1.3 cm acute infarct involving the posterior right corona radiata and basal ganglia. No intracranial hemorrhage, mass, midline shift, hydrocephalus, or extra-axial fluid collection is identified. There is mild cerebral atrophy. Small T2 hyperintensities in  the cerebral white matter bilaterally are nonspecific but compatible with minimal chronic small vessel ischemic disease, not atypical for age. 2 adjacent tiny chronic infarcts are suspected inferiorly in the right cerebellar hemisphere. Major intracranial vascular flow voids are preserved. ORBITS: No acute abnormality. SINUSES AND MASTOIDS: Small right and trace left mastoid fluid. Minimal mucosal thickening in the paranasal sinuses. BONES AND SOFT TISSUES: Normal marrow signal. No acute soft tissue abnormality. IMPRESSION: 1. Acute infarct involving the posterior right corona radiata and basal ganglia. 2. 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 Electronically signed by: Dasie Hamburg MD 09/23/2024 12:41 PM EST RP Workstation: [id number]   CT ANGIO HEAD NECK W WO CM Result Date: 09/23/2024 EXAM: CTA HEAD AND NECK WITHOUT AND WITH 09/23/2024 12:31:29 AM TECHNIQUE: CTA of the head and neck was performed without and with the administration of 75 mL of iohexol  (OMNIPAQUE ) 350 MG/ML injection. Multiplanar 2D and/or 3D reformatted images are provided for review. Automated exposure control, iterative reconstruction, and/or weight based adjustment of the mA/kV was utilized to reduce the radiation dose to as low as reasonably achievable. Stenosis of the internal carotid arteries measured using NASCET criteria. COMPARISON: None available CLINICAL HISTORY: Syncope/presyncope, cerebrovascular cause suspected. FINDINGS: CTA NECK: AORTIC ARCH AND ARCH  VESSELS: No dissection or arterial injury. No significant stenosis of the brachiocephalic or subclavian arteries. CERVICAL CAROTID ARTERIES: No dissection, arterial injury, or hemodynamically significant stenosis by NASCET criteria. CERVICAL VERTEBRAL ARTERIES: No dissection, arterial injury, or significant stenosis. LUNGS AND MEDIASTINUM: Pulmonary emphysema. SOFT TISSUES: No acute abnormality. BONES: No acute abnormality. CTA HEAD: ANTERIOR CIRCULATION: No significant stenosis of the internal carotid arteries. No significant stenosis of the anterior cerebral arteries. No significant stenosis of the middle cerebral arteries. No aneurysm. POSTERIOR CIRCULATION: Moderate stenosis of the right PCA distal P1 segment. No significant stenosis of the left posterior cerebral artery. No significant stenosis of the basilar artery. No significant stenosis of the vertebral arteries. No aneurysm. OTHER: No dural venous sinus thrombosis on this non-dedicated study. IMPRESSION: 1. No emergent large vessel occlusion. 2. 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 3. Emphysema. Electronically signed by: Franky Stanford MD 09/23/2024 12:42 AM EST RP Workstation: [id number]   CT Head Wo Contrast Result Date: 09/22/2024 EXAM: CT HEAD WITHOUT 09/22/2024 11:19:00 PM TECHNIQUE: CT of the head was performed without the administration of intravenous contrast. Automated exposure control, iterative reconstruction, and/or weight based adjustment of the mA/kV was utilized to reduce the radiation dose to as low as reasonably achievable. COMPARISON: None available. CLINICAL HISTORY: Headache, neuro deficit FINDINGS: BRAIN AND VENTRICLES: No acute intracranial hemorrhage. No mass effect or midline shift. No extra-axial fluid collection. No evidence of acute infarct. No hydrocephalus. ORBITS: No acute abnormality. SINUSES AND MASTOIDS: Opacification of a few right mastoid air cells. Paranasal sinuses are well aerated. SOFT TISSUES AND  SKULL: No acute skull fracture. No acute soft tissue abnormality. IMPRESSION: 1. No acute intracranial abnormality. Electronically signed by: Norman Gatlin MD 09/22/2024 11:24 PM EST RP Workstation: [id number]     Labs:   Basic Metabolic Panel: Recent Labs  Lab 09/22/24 1922 09/23/24 0429 09/23/24 0843  NA 140  --  139  K 4.0  --  3.9  CL 107  --  105  CO2 23  --  22  GLUCOSE 103*  --  103*  BUN 21  --  13  CREATININE 1.23* 1.02* 0.99  CALCIUM  9.3  --  9.5  MG  --   --  2.4  PHOS  --   --  3.1   GFR Estimated Creatinine Clearance: 42.6 mL/min (by C-G formula based on SCr of 0.99 mg/dL). Liver Function Tests: Recent Labs  Lab 09/22/24 1922 09/23/24 0843  AST 17 28  ALT 9 6  ALKPHOS 73 78  BILITOT 0.5 0.7  PROT 6.8 7.4  ALBUMIN 4.0 4.1   No results for input(s): LIPASE, AMYLASE in the last 168 hours. No results for input(s): AMMONIA in the last 168 hours. Coagulation profile Recent Labs  Lab 09/23/24 0843  INR 1.0    CBC: Recent Labs  Lab 09/22/24 1922 09/23/24 0429 09/23/24 0843  WBC 5.4 4.9 6.6  NEUTROABS 3.3  --   --   HGB 12.4 13.4 14.5  HCT 38.7 41.5 43.9  MCV 93.0 91.8 91.6  PLT 194 184 191   Cardiac Enzymes: No results for input(s): CKTOTAL, CKMB, CKMBINDEX, TROPONINI in the last 168 hours. BNP: Invalid input(s): POCBNP CBG: Recent Labs  Lab 09/23/24 2316 09/24/24 0307 09/24/24 0756 09/24/24 1140 09/24/24 1531  GLUCAP 173* 71 99 123* 118*   D-Dimer No results for input(s): DDIMER in the last 72 hours. Hgb A1c No results for input(s): HGBA1C in the last 72 hours. Lipid Profile No results for input(s): CHOL, HDL, LDLCALC, TRIG, CHOLHDL, LDLDIRECT in the last 72 hours. Thyroid function studies No results for input(s): TSH, T4TOTAL, T3FREE, THYROIDAB in the last 72 hours.  Invalid input(s): FREET3 Anemia work up No results for input(s): VITAMINB12, FOLATE, FERRITIN, TIBC, IRON,  RETICCTPCT in the last 72 hours. Microbiology Recent Results (from the past 240 hours)  MRSA Next Gen by PCR, Nasal     Status: None   Collection Time: 09/23/24  4:28 AM   Specimen: Nasal Mucosa; Nasal Swab  Result Value Ref Range Status   MRSA by PCR Next Gen NOT DETECTED NOT DETECTED Final    Comment: (NOTE) The GeneXpert MRSA Assay (FDA approved for NASAL specimens only), is one component of a comprehensive MRSA colonization surveillance program. It is not intended to diagnose MRSA infection nor to guide or monitor treatment for MRSA infections. Test performance is not FDA approved in patients less than 64 years old. Performed at [facility], [address], [place], [place] [address]     Time coordinating discharge: 45 minutes  Signed: Chapman Brittne Kawasaki  [facility] 09/29/2024, 10:47 AM   "

## 2024-09-29 NOTE — Progress Notes (Signed)
 Pt to be discharged to SNF via [facility]. Report called to RN Lucie at facility. AVS printed and placed with other paperwork to be given to [facility] on arrival. Pts IV removed and tele disconnected prior to discharge. Pts belongings gathered and pt assisted into clothing for transportation. Pt transported off unit via stretcher and [facility].

## 2024-09-29 NOTE — Plan of Care (Signed)
 " Problem: Education: Goal: Ability to describe self-care measures that may prevent or decrease complications (Diabetes Survival Skills Education) will improve Outcome: Adequate for Discharge Goal: Individualized Educational Video(s) Outcome: Adequate for Discharge   Problem: Coping: Goal: Ability to adjust to condition or change in health will improve Outcome: Adequate for Discharge   Problem: Fluid Volume: Goal: Ability to maintain a balanced intake and output will improve Outcome: Adequate for Discharge   Problem: Health Behavior/Discharge Planning: Goal: Ability to identify and utilize available resources and services will improve Outcome: Adequate for Discharge Goal: Ability to manage health-related needs will improve Outcome: Adequate for Discharge   Problem: Metabolic: Goal: Ability to maintain appropriate glucose levels will improve Outcome: Adequate for Discharge   Problem: Nutritional: Goal: Maintenance of adequate nutrition will improve Outcome: Adequate for Discharge Goal: Progress toward achieving an optimal weight will improve Outcome: Adequate for Discharge   Problem: Skin Integrity: Goal: Risk for impaired skin integrity will decrease Outcome: Adequate for Discharge   Problem: Tissue Perfusion: Goal: Adequacy of tissue perfusion will improve Outcome: Adequate for Discharge   Problem: Education: Goal: Knowledge of disease or condition will improve Outcome: Adequate for Discharge Goal: Knowledge of secondary prevention will improve (MUST DOCUMENT ALL) Outcome: Adequate for Discharge Goal: Knowledge of patient specific risk factors will improve (DELETE if not current risk factor) Outcome: Adequate for Discharge   Problem: Ischemic Stroke/TIA Tissue Perfusion: Goal: Complications of ischemic stroke/TIA will be minimized Outcome: Adequate for Discharge   Problem: Coping: Goal: Will verbalize positive feelings about self Outcome: Adequate for  Discharge Goal: Will identify appropriate support needs Outcome: Adequate for Discharge   Problem: Health Behavior/Discharge Planning: Goal: Ability to manage health-related needs will improve Outcome: Adequate for Discharge Goal: Goals will be collaboratively established with patient/family Outcome: Adequate for Discharge   Problem: Self-Care: Goal: Ability to participate in self-care as condition permits will improve Outcome: Adequate for Discharge Goal: Verbalization of feelings and concerns over difficulty with self-care will improve Outcome: Adequate for Discharge Goal: Ability to communicate needs accurately will improve Outcome: Adequate for Discharge   Problem: Nutrition: Goal: Risk of aspiration will decrease Outcome: Adequate for Discharge Goal: Dietary intake will improve Outcome: Adequate for Discharge   Problem: Education: Goal: Knowledge of General Education information will improve Description: Including pain rating scale, medication(s)/side effects and non-pharmacologic comfort measures Outcome: Adequate for Discharge   Problem: Health Behavior/Discharge Planning: Goal: Ability to manage health-related needs will improve Outcome: Adequate for Discharge   Problem: Clinical Measurements: Goal: Ability to maintain clinical measurements within normal limits will improve Outcome: Adequate for Discharge Goal: Will remain free from infection Outcome: Adequate for Discharge Goal: Diagnostic test results will improve Outcome: Adequate for Discharge Goal: Respiratory complications will improve Outcome: Adequate for Discharge Goal: Cardiovascular complication will be avoided Outcome: Adequate for Discharge   Problem: Activity: Goal: Risk for activity intolerance will decrease Outcome: Adequate for Discharge   Problem: Nutrition: Goal: Adequate nutrition will be maintained Outcome: Adequate for Discharge   Problem: Coping: Goal: Level of anxiety will  decrease Outcome: Adequate for Discharge   Problem: Elimination: Goal: Will not experience complications related to bowel motility Outcome: Adequate for Discharge Goal: Will not experience complications related to urinary retention Outcome: Adequate for Discharge   Problem: Pain Managment: Goal: General experience of comfort will improve and/or be controlled Outcome: Adequate for Discharge   Problem: Safety: Goal: Ability to remain free from injury will improve Outcome: Adequate for Discharge   Problem: Skin Integrity: Goal: Risk  for impaired skin integrity will decrease Outcome: Adequate for Discharge   Problem: Education: Goal: Knowledge of disease or condition will improve Outcome: Adequate for Discharge Goal: Knowledge of secondary prevention will improve (MUST DOCUMENT ALL) Outcome: Adequate for Discharge Goal: Knowledge of patient specific risk factors will improve (DELETE if not current risk factor) Outcome: Adequate for Discharge   "

## 2025-01-09 ENCOUNTER — Inpatient Hospital Stay: Admitting: Neurology
